# Patient Record
Sex: Male | Born: 1955
Health system: Southern US, Community
[De-identification: ages and names within clinical notes are randomized; demographics above are authoritative.]

## PROBLEM LIST (undated history)

## (undated) DIAGNOSIS — G4730 Sleep apnea, unspecified: Secondary | ICD-10-CM

## (undated) DIAGNOSIS — R079 Chest pain, unspecified: Secondary | ICD-10-CM

## (undated) DIAGNOSIS — E785 Hyperlipidemia, unspecified: Secondary | ICD-10-CM

## (undated) DIAGNOSIS — R0600 Dyspnea, unspecified: Secondary | ICD-10-CM

## (undated) DIAGNOSIS — I4510 Unspecified right bundle-branch block: Secondary | ICD-10-CM

## (undated) DIAGNOSIS — I2510 Atherosclerotic heart disease of native coronary artery without angina pectoris: Secondary | ICD-10-CM

## (undated) DIAGNOSIS — I10 Essential (primary) hypertension: Secondary | ICD-10-CM

## (undated) DIAGNOSIS — E119 Type 2 diabetes mellitus without complications: Secondary | ICD-10-CM

## (undated) HISTORY — DX: Unspecified right bundle-branch block: I45.10

## (undated) HISTORY — DX: Sleep apnea, unspecified: G47.30

## (undated) HISTORY — DX: Atherosclerotic heart disease of native coronary artery without angina pectoris: I25.10

## (undated) HISTORY — DX: Dyspnea, unspecified: R06.00

## (undated) HISTORY — DX: Essential (primary) hypertension: I10

## (undated) HISTORY — DX: Chest pain, unspecified: R07.9

## (undated) HISTORY — DX: Hyperlipidemia, unspecified: E78.5

## (undated) HISTORY — DX: Type 2 diabetes mellitus without complications: E11.9

---

## 2004-08-23 ENCOUNTER — Ambulatory Visit: Payer: Self-pay | Admitting: Family Medicine

## 2004-10-10 ENCOUNTER — Ambulatory Visit: Payer: Self-pay | Admitting: Family Medicine

## 2004-10-18 ENCOUNTER — Ambulatory Visit: Payer: Self-pay | Admitting: Family Medicine

## 2004-11-08 HISTORY — PX: OTHER SURGICAL HISTORY: SHX169

## 2004-11-16 ENCOUNTER — Ambulatory Visit (HOSPITAL_COMMUNITY): Admission: RE | Admit: 2004-11-16 | Discharge: 2004-11-17 | Payer: Self-pay | Admitting: Cardiovascular Disease

## 2004-11-28 ENCOUNTER — Ambulatory Visit: Payer: Self-pay | Admitting: Family Medicine

## 2005-01-17 ENCOUNTER — Ambulatory Visit: Payer: Self-pay | Admitting: Family Medicine

## 2005-03-01 ENCOUNTER — Ambulatory Visit: Payer: Self-pay | Admitting: Family Medicine

## 2005-06-06 ENCOUNTER — Ambulatory Visit: Payer: Self-pay | Admitting: Family Medicine

## 2005-09-13 ENCOUNTER — Ambulatory Visit: Payer: Self-pay | Admitting: Family Medicine

## 2005-12-19 ENCOUNTER — Ambulatory Visit: Payer: Self-pay | Admitting: Family Medicine

## 2006-03-28 ENCOUNTER — Ambulatory Visit: Payer: Self-pay | Admitting: Family Medicine

## 2006-06-06 ENCOUNTER — Ambulatory Visit: Payer: Self-pay | Admitting: Family Medicine

## 2006-09-06 ENCOUNTER — Ambulatory Visit: Payer: Self-pay | Admitting: Family Medicine

## 2006-10-25 ENCOUNTER — Ambulatory Visit: Payer: Self-pay | Admitting: Family Medicine

## 2006-12-12 ENCOUNTER — Ambulatory Visit: Payer: Self-pay | Admitting: Family Medicine

## 2007-03-01 HISTORY — PX: OTHER SURGICAL HISTORY: SHX169

## 2007-05-03 HISTORY — PX: OTHER SURGICAL HISTORY: SHX169

## 2007-10-20 HISTORY — PX: ESOPHAGOGASTRODUODENOSCOPY: SHX1529

## 2007-11-05 ENCOUNTER — Emergency Department (HOSPITAL_COMMUNITY): Admission: EM | Admit: 2007-11-05 | Discharge: 2007-11-05 | Payer: Self-pay | Admitting: Emergency Medicine

## 2007-11-13 ENCOUNTER — Ambulatory Visit: Payer: Self-pay | Admitting: Urgent Care

## 2007-11-20 HISTORY — PX: COLONOSCOPY: SHX174

## 2007-11-20 HISTORY — PX: ESOPHAGOGASTRODUODENOSCOPY: SHX1529

## 2007-12-04 ENCOUNTER — Ambulatory Visit (HOSPITAL_COMMUNITY): Admission: RE | Admit: 2007-12-04 | Discharge: 2007-12-04 | Payer: Self-pay | Admitting: Internal Medicine

## 2007-12-04 ENCOUNTER — Ambulatory Visit: Payer: Self-pay | Admitting: Internal Medicine

## 2008-03-11 ENCOUNTER — Ambulatory Visit: Payer: Self-pay | Admitting: Internal Medicine

## 2009-04-30 HISTORY — PX: CARDIOVASCULAR STRESS TEST: SHX262

## 2009-05-12 DIAGNOSIS — Z8719 Personal history of other diseases of the digestive system: Secondary | ICD-10-CM

## 2009-05-12 DIAGNOSIS — K222 Esophageal obstruction: Secondary | ICD-10-CM

## 2009-05-13 ENCOUNTER — Ambulatory Visit: Payer: Self-pay | Admitting: Internal Medicine

## 2009-05-14 DIAGNOSIS — K219 Gastro-esophageal reflux disease without esophagitis: Secondary | ICD-10-CM | POA: Insufficient documentation

## 2009-06-18 ENCOUNTER — Emergency Department (HOSPITAL_COMMUNITY): Admission: EM | Admit: 2009-06-18 | Discharge: 2009-06-18 | Payer: Self-pay | Admitting: Emergency Medicine

## 2009-09-01 ENCOUNTER — Ambulatory Visit (HOSPITAL_COMMUNITY): Admission: RE | Admit: 2009-09-01 | Discharge: 2009-09-01 | Payer: Self-pay | Admitting: Cardiovascular Disease

## 2009-09-10 ENCOUNTER — Observation Stay (HOSPITAL_COMMUNITY): Admission: AD | Admit: 2009-09-10 | Discharge: 2009-09-11 | Payer: Self-pay | Admitting: Cardiovascular Disease

## 2009-09-10 HISTORY — PX: CARDIAC CATHETERIZATION: SHX172

## 2009-11-01 HISTORY — PX: CARDIOVASCULAR STRESS TEST: SHX262

## 2010-11-06 LAB — CBC
HCT: 35.7 % — ABNORMAL LOW (ref 39.0–52.0)
Hemoglobin: 12.1 g/dL — ABNORMAL LOW (ref 13.0–17.0)
MCHC: 33.8 g/dL (ref 30.0–36.0)
MCV: 93.8 fL (ref 78.0–100.0)
Platelets: 215 10*3/uL (ref 150–400)
RDW: 14.6 % (ref 11.5–15.5)

## 2010-11-06 LAB — CARDIAC PANEL(CRET KIN+CKTOT+MB+TROPI)
CK, MB: 0.8 ng/mL (ref 0.3–4.0)
Relative Index: INVALID (ref 0.0–2.5)
Total CK: 84 U/L (ref 7–232)
Troponin I: 0.03 ng/mL (ref 0.00–0.06)

## 2010-11-06 LAB — GLUCOSE, CAPILLARY
Glucose-Capillary: 119 mg/dL — ABNORMAL HIGH (ref 70–99)
Glucose-Capillary: 233 mg/dL — ABNORMAL HIGH (ref 70–99)

## 2010-11-06 LAB — BASIC METABOLIC PANEL
CO2: 27 mEq/L (ref 19–32)
Calcium: 8.3 mg/dL — ABNORMAL LOW (ref 8.4–10.5)
Creatinine, Ser: 1.04 mg/dL (ref 0.4–1.5)
GFR calc Af Amer: >60 mL/min (ref >60)
GFR calc non Af Amer: >60 mL/min (ref >60)
Sodium: 137 mEq/L (ref 135–145)

## 2011-01-03 NOTE — Op Note (Signed)
NAME:  [name] [name], [name] [name] NO.:  [id number]   MEDICAL RECORD NO.:  [id number]          PATIENT TYPE:  EMS   LOCATION:  MAJO                         FACILITY:  [facility]   PHYSICIAN:  James L. Malon Kindle., M.D.DATE OF BIRTH:  1955/10/14   DATE OF PROCEDURE:  11/05/2007  DATE OF DISCHARGE:                               OPERATIVE REPORT   PROCEDURE:  Esophagogastroduodenoscopy with removal of food impaction.   MEDICATIONS:  Fentanyl 60 mcg, Versed 76 mg IV and Cetacaine spray.   DESCRIPTION OF PROCEDURE:  Nice gentleman who is referred from the  [facility] ER.  The procedure was explained including potential risk and  benefits and complications and the Pentax scope inserted blindly into  the esophagus.  A lot of saliva in the esophagus.  This was suctioned  out.  The patient did have some retching and gagging.  We reached the  distal esophagus there was several pieces of meat actually that were  impacted there.  I felt that I could see a lumen in one section and  actually went over there and pushed a piece of meat into the stomach.  Pulled back and there were several other pieces in the esophagus which  were also pushed with the scope into the stomach.  There was a ring that  was friable and somewhat ulcerated probably due to the meat being  impacted for so long.  The scope easily passed.  A complete endoscopy  was performed.  The duodenum including the bulb and second portion was  seen well, was normal.  The pyloric channel as well was normal.  The  antrum, body, fundus and cardia were seen well on the retroflex view and  were normal.  There was a 2-3 cm hiatal hernia in the ring as previously  mentioned.  Scope was withdrawn.  The patient tolerated the procedure  well, was awake and alert at termination of procedure.  There were no  immediate complications.   ASSESSMENT:  1. Food impaction removed.  2. Friable esophageal ring.   PLAN:  Will continue the patient on  Prilosec give twice daily for the  next three days.  Will have him stay on clear liquids for a couple hours  and graduate to soft foods and ask him to follow up with Dr. Jena Gauss in  [place].           ______________________________  Llana Aliment. Malon Kindle., M.D.     Waldron Session  D:  11/05/2007  T:  11/05/2007  Job:  [id number]   cc:   R. Roetta Sessions, M.D.  Delaney Meigs, M.D.

## 2011-01-03 NOTE — Op Note (Signed)
NAME:  [name] [name], [name] [name] NO.:  [id number]   MEDICAL RECORD NO.:  [id number]          PATIENT TYPE:  AMB   LOCATION:  DAY                           FACILITY:  [facility]   PHYSICIAN:  R. Roetta Sessions, M.D. DATE OF BIRTH:  Aug 25, 1955   DATE OF PROCEDURE:  12/04/2007  DATE OF DISCHARGE:                                PROCEDURE NOTE   PROCEDURES PERFORMED:  Esophagogastroduodenoscopy followed by screening  colonoscopy.   INDICATIONS FOR PROCEDURE:  A 56 year old gentleman with a history of  Schatzki ring, last dilated in 2002, who presented with food impaction  recently for which Dr. __________  emergently disimpacted him and he was  started on omeprazole 20 mg orally b.i.d. through our office on November 13, 2007.  This had been associated with improvement in his reflux  symptoms and some improvement in his dysphagia.  He tells me his  dilation back in 2002 lasted about 6 months and  then he developed  intermittent esophageal dysphagia, which seems to be worsened, and he  was dilated with a 56-French Maloney dilator at that time.  EGD is now  being done with an intent for esophageal dilation as appropriate.  He  has not had a screening colonoscopy.  He is not having any lower GI  tract symptoms.  There is no family history of colon cancer.  Consequently, EGD with colonoscopy is now being done.  This approach was  discussed with the patient at length.  Potential risks, benefits, and  alternatives have been reviewed, all questions were answered.  The  patient is agreeable.  Please see Dr. Luvenia Starch medical record.   PROCEDURE NOTE:  O2 saturation, blood pressure, pulse, and respirations  were monitored throughout the entirety of both procedures.  Conscious  sedation with Versed 6 mg IV and Demerol 100 mg IV in divided doses.  Instrument:  Pentax video chip system.  Cetacaine spray for topical  oropharyngeal anesthesia.   FINDINGS:  EGD examination of tubular esophagus revealed  a Schatzki  ring.  Esophageal mucosa otherwise  appeared normal.  There is no  esophagitis or evidence of Barrett's esophagus or neoplasia.  EG  junction was easily traversed with a scope and the stomach, gastric  cavity was empty and insufflated well with air.  Examination of gastric  mucosa including retroflex view of the proximal stomach and  esophagogastric junction, demonstrated only a small hiatal hernia.  Pylorus is patent, easily traversed.  Examination of the bulb and second  portion revealed no abnormalities with therapeutic/diagnostic maneuvers.  The scope was withdrawn, and a 58-French Maloney dilator was passed in  full insertion with ease, look back revealed the ring had been ruptured  nicely without apparent complications.  The patient tolerated the  procedure well and was prepared for colonoscopy.   Digital rectal exam revealed no abnormalities.  The scope was placed.  Prep was adequate.  Colon:  Colonic mucosa was surveyed from  rectosigmoid junction through the left transverse to right colon to the  appendiceal orifice, ileocecal valve, and cecum.  These structures were  well seen  and photographed for the record.  From this level, the scope  was slowly and cautiously withdrawn.  All previously mentioned mucosal  surfaces were again seen.  The colonic mucosa appeared normal.  Scope  was pulled down the rectum where thorough examination of the rectal  mucosa including retroflexed view of the anal verge demonstrated no  abnormalities.  The patient tolerated the procedure well with  reaction  to endoscopy.   IMPRESSION:  Esophagogastroduodenoscopy:  Schatzki ring otherwise  unremarkable, soft, status post dilation as described above.  Small  hiatal hernia, otherwise normal stomach.  D1 and D2.  Colonoscopy findings:  Normal colon and normal rectum.   RECOMMENDATIONS:  1. Repeat screening colonoscopy in 10 years.  2. Continue omeprazole 20 mg orally b.i.d. for the next 3  months.  We      will plan on see the patient back in 3 months to see how he is      doing and go from there.      Jonathon Bellows, M.D.  Electronically Signed     RMR/MEDQ  D:  12/04/2007  T:  12/05/2007  Job:  [id number]   cc:   Delaney Meigs, M.D.  Fax: [phone]

## 2011-01-03 NOTE — Consult Note (Signed)
NAME:  [name] [name], [name] [name] NO.:  [id number]   MEDICAL RECORD NO.:  [id number]          PATIENT TYPE:  EMS   LOCATION:  MAJO                         FACILITY:  [facility]   PHYSICIAN:  James L. Malon Kindle., M.D.DATE OF BIRTH:  1956/08/13   DATE OF CONSULTATION:  11/05/2007  DATE OF DISCHARGE:                                 CONSULTATION   GASTROENTEROLOGY CONSULTATION   REFERRING PHYSICIAN:  [facility] emergency room.   HISTORY OF PRESENT ILLNESS:  A 55 year old gentleman, patient of Dr.  Roetta Sessions and Dr. Joette Catching, who has had previous reflux, has  had esophageal strictures, has been dilated in the past.  He has food  hang up occasionally.  Last night he ate roast beef and has been unable  to swallow saliva since.  He went to the [facility] emergency room and  was spitting up saliva.  They did not have a gastroenterologist on call  and he was sent here for definitive care.  He is not having a great deal  of pain but still spitting up saliva. He has had two doses of glucagon  during the night and has failed to improve.   CURRENT MEDICATIONS:  1. Prilosec.  2. Actos.  3. Metformin.  4. Diovan.  5. Plavix.  6. Occasional aspirin.   ALLERGIES:  VICODIN.   PAST MEDICAL HISTORY:  He does have a history of coronary artery  disease. He has had previous bypass surgery and stent. He sees Dr.  Nanetta Batty.  He says his heart is doing quite well.  He has never  had any other surgeries.   SOCIAL HISTORY:  He is married and does not smoke or drink.   FAMILY HISTORY:  Noncontributory.   PHYSICAL EXAMINATION:  VITAL SIGNS:  Non-remarkable.  GENERAL:  Alert, oriented white male. He is not icteric.  No signs of  chronic liver disease.  HEENT:  Throat is normal. No food materials or blood in the pharynx.  NECK:  Is somewhat short and no masses, crepitus, etc.  LUNGS:  Clear.  HEART:  Reveals regular rate and rhythm without murmurs, rubs or  gallops.  ABDOMEN:  Soft and nontender.   ASSESSMENT:  1. Meat impaction of the esophagus.  Patient needs endoscopy with      removal of this.  I have spent some time talking with him about      this.  We discussed the possibility of being unable to remove the      meat bolus, perforation, bleeding, etc. from the procedure.  He      understands all this.  He knows if we cannot get this out he may      need to go to surgery and have the thoracic surgeons use a rigid      scope to try to get it out.  2. Non-insulin-dependent diabetes.  3. Coronary disease with history of stents and previous bypass      surgery.   PLAN:  We will proceed at this time with endoscopy as noted above.  Risks and benefits were all  discussed with patient.           ______________________________  Llana Aliment. Malon Kindle., M.D.     Waldron Session  D:  11/05/2007  T:  11/05/2007  Job:  [id number]   cc:   Delaney Meigs, M.D.  Jonathon Bellows, M.D.

## 2011-01-03 NOTE — Consult Note (Signed)
NAME:  [name] [name], [name] [name] NO.:  [id number]   MEDICAL RECORD NO.:  [id number]          PATIENT TYPE:  AMB   LOCATION:  DAY                           FACILITY:  [facility]   PHYSICIAN:  R. Roetta Sessions, M.D. DATE OF BIRTH:  [birth year]   DATE OF CONSULTATION:  DATE OF DISCHARGE:                                 CONSULTATION   REQUESTING PHYSICIAN:  Dr. Randa Evens.   CHIEF COMPLAINT:  Esophageal stricture, history of food impaction.   HISTORY OF PRESENT ILLNESS:  [name] [name] is a 55 year old Caucasian male  who was seen at [facility] ER on November 05, 2007.  He had eaten roast  beef and was unable to swallow saliva since.  He was sent to [facility]  emergency room for food impaction after failing previous glucagon  attempts.  He has a history of previous Schatzki's ring and was dilated  back in 2002 with a 56 [place] Maloney dilator.  He also has a hiatal  hernia.  He underwent urgent EGD for food impaction by Dr. Randa Evens on  November 05, 2007.  He was found to have a friable somewhat ulcerated  esophageal ring and a normal duodenal including the bulb and second  portion.  Pyloric channel was normal.  Antrum, body, fundus and cardia  were normal.  There was a 2-3 cm hiatal hernia in the ring per his  report.  He was started on Prilosec but admits to not taking this.  Instead he is taking famotidine 10 mg b.i.d.  He notes history of  chronic intermittent dysphagia.  He feels as though food is occasionally  hung in his, and points to his mid esophagus.  It usually hangs there  for a couple of minutes up to 15.  He occasionally has regurgitation.  He seldom has problems with water.  He has seldom heartburn or  indigestion.   PAST MEDICAL AND SURGICAL HISTORY:  EGD for food impaction by Dr.  Randa Evens November 05, 2007, as described in the HPI.  EGD by Dr. Jena Gauss March 06, 2001, critical Schatzki's ring dilated with 56 [place] Maloney  dilator, small hiatal hernia.  He has a history of diabetes  mellitus,  hypertension, rosacea, coronary artery disease status post CABG and  stent placement.  He is on Plavix.   CURRENT MEDICATIONS:  1. Actos/met 15/850 mg t.i.d.  2. Plavix 75 mg daily.  3. Tetracycline 25 mg daily.  4. Metoprolol 25 mg daily.  5. Diovan 320 mg daily.  6. Niaspan 500 mg t.i.d.  7. Lexapro 20 mg daily.  8. Crestor 10 mg daily.  9. Glipizide ER 2.5 mg daily.  10.Januvia 100 mg daily.  11.Famotidine 10 mg b.i.d.  12.Aspirin 81 mg daily.  13.Coenzyme Q10 100 mg b.i.d.  14.Fish oil 1 gram t.i.d.  15.Multivitamin once daily.  16.Meloxicam 15 mg p.r.n.   ALLERGIES:  VICODIN, HYDROCHLOROTHIAZIDE.   FAMILY HISTORY:  There is no known family history of colon carcinoma or  chronic GI problems.  Mother has history of diabetes.  Father deceased  at age 34 secondary to suicide.  He has lost one brother due to  complications from surgery and has one healthy living brother and  sister.   SOCIAL HISTORY:  [name] [name] is married.  He is employed with [employer].  He denies any tobacco, alcohol or drug use.   REVIEW OF SYSTEMS:  See HPI.  GI:  He denies any constipation, diarrhea,  rectal bleeding, melena, anorexia, early satiety or weight loss.  Otherwise negative review of systems.   PHYSICAL EXAMINATION:  VITAL SIGNS:  Weight 238 pounds, height 71  inches, temperature 97.9, blood pressure 138/82 and pulse 60.  GENERAL:  [name] [name] is an obese Caucasian male who is alert, oriented,  pleasant and cooperative and in no acute distress.  HEENT.  Sclerae clear, nonicteric.  Conjunctivae clear.  Oropharynx pink  and moist without any lesions.  NECK:  Supple without any mass or thyromegaly.  CHEST:  Heart regular rate and rhythm.  Normal S1 and S2 without  murmurs, clicks, rubs or gallops.  LUNGS:  Clear to auscultation bilaterally.  ABDOMEN:  Protuberant with positive bowel sounds x4.  No bruits  auscultated.  Soft, nontender, nondistended without palpable mass  or  hepatosplenomegaly.  No rebound tenderness or guarding.  EXTREMITIES:  Without clubbing or edema.  SKIN:  Pink, warm and dry without any rash or jaundice.   IMPRESSION:  [name] [name] is a 55 year old male with recent food impaction  and EGD performed by Dr. Randa Evens in [place].  He was found to have a  hiatal hernia as well as a friable ulcerated ring in his esophagus.  He  has not been on proton pump inhibitor.  He still continues to have  symptoms of intermittent dysphagia.  He is also on tetracycline which  could be causing ulceration in the esophagus as well or it may have been  related to food bolus.  Nonetheless, I do feel that he may need  esophageal dilatation to prevent recurrence of food impaction.  He has  also never had screening colonoscopy and this was discussed with him  today.   PLAN:  1. He is to stop famotidine and begin omeprazole 20 mg b.i.d. #60 with      two refills.  2. EGD with possible esophageal dilatation by Dr. Jena Gauss in the near      future; I have discussed screening colonoscopy at the same time; I      have discussed risks and benefits which include but are not limited      to bleeding, infection, perforation and drug reaction.  He agrees      with the plan and consent will be obtained.  3. He is to hold his Plavix and aspirin for 5 days prior to the      procedure.  4. He is to take half of his Actos, glipizide and Januvia the day      prior to and of the procedures.  5. Further recommendations pending procedures.      Lorenza Burton, N.P.      Jonathon Bellows, M.D.  Electronically Signed    KJ/MEDQ  D:  11/13/2007  T:  11/13/2007  Job:  [id number]   cc:   Delaney Meigs, M.D.  Fax: [phone]

## 2011-01-03 NOTE — Assessment & Plan Note (Signed)
NAMEMarland Kitchen  [name] [name], [name] [name]                  CHART#:  [id number]   DATE:  03/11/2008                       DOB:  [birth year]   Followup Schatzki ring, gastroesophageal reflux disease, and hiatal  hernia.  I last saw the patient at the time of EGD, colonoscopy on  12/04/2007.  He was found to have a Schatzki ring, a small hiatal  hernia, it was dilated up to a 58-French size  with [facility] dilators.  Colonoscopy for screening purposes revealed normal rectum and colon.  He  has done well on Prilosec 20 mg orally daily, no recurrent dysphagia.  However, it is giving him diarrhea.   CURRENT MEDICATION LIST:  Reviewed.  Please see attached list.   ALLERGIES:  Vicodin and hydrochlorothiazide.   PHYSICAL EXAMINATION:  GENERAL:  He appears at his baseline.  VITAL SIGNS:  Weight 246, height 5 feet 11 inches, temp 98.2, BP 146/68,  and pulse 64.  SKIN:  Warm and dry.  There is no jaundice.  CHEST:  Lungs are clear to auscultation.  CARDIAC:  Regular rate and rhythm without murmur, gallop, or rub.  ABDOMEN:  Obese.  Positive bowel sounds.  Soft and nontender.  Without  appreciable mass or organomegaly.   ASSESSMENT:  Dysphagia secondary to Schatzki ring, resolved, status post  dilation.  Small hiatal hernia, negative colonoscopy, and diarrhea  secondary to Prilosec.   RECOMMENDATIONS:  Stop Prilosec and begin Protonix generic 40 mg orally  daily, prescription given.  Antireflux diet emphasized.  He may or may  not need dilation in the future.  Repeat screening colonoscopy in 10  years.  I will plan to see the patient back in 1 year.       Jonathon Bellows, M.D.  Electronically Signed     RMR/MEDQ  D:  03/11/2008  T:  03/12/2008  Job:  [id number]   cc:   Delaney Meigs, M.D.

## 2011-01-06 NOTE — Cardiovascular Report (Signed)
NAME:  [name] [name], [name] [name] NO.:  [id number]   MEDICAL RECORD NO.:  [id number]          PATIENT TYPE:  OIB   LOCATION:  6533                         FACILITY:  [facility]   PHYSICIAN:  Nanetta Batty, M.D.   DATE OF BIRTH:  1956-01-15   DATE OF PROCEDURE:  11/16/2004  DATE OF DISCHARGE:                              CARDIAC CATHETERIZATION   PROCEDURE PERFORMED:  Cardiac catheterization/PCI stenting.   BRIEF HISTORY:  [name] [name] is a 55 year old white male with history of  coronary artery bypass grafting x3 10 years ago.  He came to see me in the  office October 17, 2004 with throat and chest discomfort, similar to his  pre-bypass symptoms.  A Cardiolite stress test shows ischemia in the  circumflex territory, normal LV function.  His medications were adjusted.  He presents now for diagnostic coronary arteriography and potential  intervention.   PROCEDURE DESCRIPTION:  The patient was brought to the second floor [facility] cardiac catheterization lab in a postabsorptive state.  He was  premedicated with p.o. Valium and IV Versed.  His right groin was prepped  and shaved in the usual sterile fashion.  Xylocaine 1% was used for local  anesthesia.  A 6-French sheath was inserted into the right femoral artery,  using standard Seldinger technique.  Then 6-French right and left Judkins  diagnostic catheters, as well as a 6-French pigtail catheter, a 6-French AL1  diagnostic catheter and a 6-French AR1 diagnostic catheter were used for  selective coronary angiography, left ventriculography, selective vein graft  angiography, selective IMA angiography and distal abdominal aortography.  Visipaque dye was used through the entirety of the case.  Central aortic,  ventricular and pullback pressures were recorded.  It should be noted that  the left main was only engageable using the AL1 and the PDA vein graft using  the AR1.   HEMODYNAMICS:  1.  Aortic:  Systolic pressure 142,  diastolic pressure 64.  2.  Left ventricular:  Systolic pressure 141, diastolic pressure 7.   SELECTIVE CORONARY ANGIOGRAPHY:  1.  LEFT MAIN:  Normal.  2.  LEFT ANTERIOR DESCENDING:  Occluded proximally up to the left septal.      RAMUS INTERMEDIUS:  Occluded at its origin.  3.  LEFT CIRCUMFLEX:  A dominant vessel with 75% segmental proximal      stenosis.  It gave off a first marginal branch, which was moderate in      size and a distal marginal branch.  The continuation to the PDA had high-      grade segmental disease.  4.  RIGHT CORONARY ARTERY:  Nondominant and free of significant disease.  5.  LEFT INTERNAL MAMMARY ARTERY:  Widely patent to the LAD.  6.  GRAFTS:      1.  VEIN GRAFT TO THE PDA:  Widely patent with at most 40% stenosis at          the anastomosis.      2.  VEIN GRAFT TO THE RAMUS INTERMEDIUS:  95% fairly focal eccentric  distal stenosis, just before the anastomosis.  The ramus branch          itself was moderate in size.   LEFT VENTRICULOGRAPHY:  RAO left ventriculogram was performed using 25 cc of  Visipaque at 12 cc/sec.  The overall LVEF was estimated at greater than 60%,  without focal wall motion abnormalities.   DISTAL ABDOMINAL AORTOGRAPHY:  Performed using 20 cc of Visipaque dye at 20  cc/sec.  The renal artery was widely patent.  Infrarenal abdominal aorta and  the iliac bifurcation appear free of significant atherosclerotic changes.   IMPRESSION:  [name] [name] has high-grade distal ramus intermedius branch SVG  stenosis, which is probably his culprit lesion and responsible for his  coronary abnormality.  I cannot be sure that his proximal circumflex in this  dominant vessel is not also physiologically significant.  We will proceed  with PCI stenting of his ramus branch vein graft.   PROCEDURE DESCRIPTION:  The existing 6-French in the right femoral artery  was exchanged over a wire for a 7-French sheath.  A 6-French sheath was then  inserted  into the right femoral vein.  The patient received aspirin prior to  the procedure.  He received 600 mg of p.o. Plavix, 5000 units of heparin  intravenously with the ACT of 242.  He received Integrilin double bolus and  infusion. Retrograde aortic pressure was monitored through the entire case.  The patient then received 200 mcg of intra-graft nitroglycerin.   Using a 7-French JR4 guide catheter, along with an OM4 190 Asahi soft guide  wire and a 256 cutting balloon, atherectomy was performed to the distal  ramus intermedius branch vein graft.  This resulted in a favorable  angiographic result.  Following this, a 3.0 13 Cypher stent was then placed  precisely at the distal end of the vein graft under angiographic and  fluoroscopic control.  This was then inflated to 14 atm, resulting in  reduction of a 95% distal ramus intermedius branch stenosis to 0% residual -  - without dissection. There was TIMI-3 flow noted throughout the case.  The  patient had no hemodynamic electrocardiographic sequelae.   OVERALL IMPRESSION:  Successful ramus intermedius branch distal sequential  vein graft percutaneous coronary intervention and stenting, using cutting  balloon atherectomy and Cypher drug-eluding stent.  Excellent angiographic  results.  The patient does have residual disease at the origin/proximal  portion of his native dominant circumflex, which will be treated medically  at this time.  Guide wire and catheter were removed.  The sheaths were sewn  securely in place.  The patient left the lab in stable condition.  The  sheaths will be removed once his ACT falls below 150.  Integrilin will be  continued for 18 hr.  He will be given a p.o. prescription of aspirin and  Plavix, and discharged home in the morning if he remains clinically stable  overnight.      JB/MEDQ  D:  11/16/2004  T:  11/16/2004  Job:  [id number]   cc:   Delaney Meigs, M.D.  [address] [place]  [place] [address]  Fax:  [phone]

## 2011-05-15 LAB — BASIC METABOLIC PANEL
BUN: 8
Calcium: 9.5
Creatinine, Ser: 0.98
GFR calc Af Amer: >60
Glucose, Bld: 181 — ABNORMAL HIGH
Potassium: 3.6

## 2011-05-15 LAB — DIFFERENTIAL
Basophils Absolute: 0
Basophils Absolute: 0
Basophils Relative: 0
Basophils Relative: 0
Eosinophils Absolute: 0
Eosinophils Absolute: 0.1
Eosinophils Relative: 0
Eosinophils Relative: 1
Lymphocytes Relative: 13
Lymphocytes Relative: 23
Lymphs Abs: 1.8
Lymphs Abs: 2.3
Monocytes Absolute: 0.8
Monocytes Absolute: 0.7
Monocytes Relative: 6
Monocytes Relative: 7
Neutro Abs: 10.9 — ABNORMAL HIGH
Neutro Abs: 6.8
Neutrophils Relative %: 81 — ABNORMAL HIGH
Neutrophils Relative %: 69

## 2011-05-15 LAB — CBC
HCT: 38.2 — ABNORMAL LOW
HCT: 37.6 — ABNORMAL LOW
Hemoglobin: 13.2
Hemoglobin: 13.2
MCHC: 34.5
MCV: 92.8
Platelets: 240
Platelets: 246
RBC: 4.12 — ABNORMAL LOW
RBC: 4.09 — ABNORMAL LOW
RDW: 14.1
WBC: 13.5 — ABNORMAL HIGH
WBC: 9.9

## 2011-05-15 LAB — I-STAT 8, (EC8 V) (CONVERTED LAB)
Bicarbonate: 24
HCT: 42
Operator id: 270651
Sodium: 138
pCO2, Ven: 38.1 — ABNORMAL LOW

## 2011-05-15 LAB — POCT I-STAT CREATININE: Creatinine, Ser: 1

## 2011-12-08 ENCOUNTER — Encounter (HOSPITAL_COMMUNITY): Payer: Self-pay | Admitting: Pharmacy Technician

## 2011-12-08 ENCOUNTER — Ambulatory Visit
Admission: RE | Admit: 2011-12-08 | Discharge: 2011-12-08 | Disposition: A | Discharge status: Home / Self Care | Payer: BC Managed Care – PPO | Source: Ambulatory Visit | Attending: Cardiovascular Disease | Admitting: Cardiovascular Disease

## 2011-12-08 ENCOUNTER — Other Ambulatory Visit: Payer: Self-pay | Admitting: Cardiovascular Disease

## 2011-12-08 DIAGNOSIS — Z87891 Personal history of nicotine dependence: Secondary | ICD-10-CM

## 2011-12-08 DIAGNOSIS — Z01811 Encounter for preprocedural respiratory examination: Secondary | ICD-10-CM

## 2011-12-08 HISTORY — PX: CARDIOVASCULAR STRESS TEST: SHX262

## 2011-12-11 ENCOUNTER — Other Ambulatory Visit: Payer: Self-pay | Admitting: Cardiovascular Disease

## 2011-12-13 ENCOUNTER — Ambulatory Visit (HOSPITAL_COMMUNITY)
Admission: RE | Admit: 2011-12-13 | Discharge: 2011-12-13 | Disposition: A | Discharge status: Home / Self Care | Payer: BC Managed Care – PPO | Source: Ambulatory Visit | Attending: Cardiovascular Disease | Admitting: Cardiovascular Disease

## 2011-12-13 ENCOUNTER — Encounter (HOSPITAL_COMMUNITY)
Admission: RE | Disposition: A | Discharge status: Home / Self Care | Payer: Self-pay | Source: Ambulatory Visit | Attending: Cardiovascular Disease

## 2011-12-13 DIAGNOSIS — E119 Type 2 diabetes mellitus without complications: Secondary | ICD-10-CM | POA: Insufficient documentation

## 2011-12-13 DIAGNOSIS — I2510 Atherosclerotic heart disease of native coronary artery without angina pectoris: Secondary | ICD-10-CM | POA: Insufficient documentation

## 2011-12-13 DIAGNOSIS — G4733 Obstructive sleep apnea (adult) (pediatric): Secondary | ICD-10-CM | POA: Insufficient documentation

## 2011-12-13 DIAGNOSIS — I10 Essential (primary) hypertension: Secondary | ICD-10-CM | POA: Insufficient documentation

## 2011-12-13 DIAGNOSIS — Z9861 Coronary angioplasty status: Secondary | ICD-10-CM | POA: Insufficient documentation

## 2011-12-13 DIAGNOSIS — I25810 Atherosclerosis of coronary artery bypass graft(s) without angina pectoris: Secondary | ICD-10-CM | POA: Insufficient documentation

## 2011-12-13 HISTORY — PX: LEFT HEART CATHETERIZATION WITH CORONARY ANGIOGRAM: SHX5451

## 2011-12-13 HISTORY — PX: CARDIAC CATHETERIZATION: SHX172

## 2011-12-13 LAB — GLUCOSE, CAPILLARY: Glucose-Capillary: 204 mg/dL — ABNORMAL HIGH (ref 70–99)

## 2011-12-13 SURGERY — LEFT HEART CATHETERIZATION WITH CORONARY ANGIOGRAM
Anesthesia: LOCAL

## 2011-12-13 MED ORDER — FENTANYL CITRATE 0.05 MG/ML IJ SOLN
INTRAMUSCULAR | Status: AC
  Filled 2011-12-13: qty 2

## 2011-12-13 MED ORDER — INSULIN ASPART 100 UNIT/ML ~~LOC~~ SOLN
0.0000 [IU] | SUBCUTANEOUS | Status: DC
Start: 2011-12-14 — End: 2011-12-13

## 2011-12-13 MED ORDER — MIDAZOLAM HCL 2 MG/2ML IJ SOLN
INTRAMUSCULAR | Status: AC
  Filled 2011-12-13: qty 2

## 2011-12-13 MED ORDER — SODIUM CHLORIDE 0.9 % IJ SOLN: 3.0000 mL | INTRAMUSCULAR | Status: DC | PRN

## 2011-12-13 MED ORDER — INSULIN ASPART 100 UNIT/ML ~~LOC~~ SOLN
SUBCUTANEOUS | Status: AC
  Administered 2011-12-13: 3 [IU] via SUBCUTANEOUS
  Filled 2011-12-13: qty 1

## 2011-12-13 MED ORDER — NITROGLYCERIN 0.2 MG/ML ON CALL CATH LAB
INTRAVENOUS | Status: AC
  Filled 2011-12-13: qty 1

## 2011-12-13 MED ORDER — LIDOCAINE HCL (PF) 1 % IJ SOLN
INTRAMUSCULAR | Status: AC
  Filled 2011-12-13: qty 30

## 2011-12-13 MED ORDER — HEPARIN (PORCINE) IN NACL 2-0.9 UNIT/ML-% IJ SOLN
INTRAMUSCULAR | Status: AC
  Filled 2011-12-13: qty 2000

## 2011-12-13 MED ORDER — DIAZEPAM 5 MG PO TABS: 5.0000 mg | ORAL_TABLET | ORAL | Status: DC

## 2011-12-13 MED ORDER — SODIUM CHLORIDE 0.9 % IV SOLN
INTRAVENOUS | Status: DC
  Administered 2011-12-13: 11:00:00 via INTRAVENOUS

## 2011-12-13 NOTE — H&P (Signed)
  H & P will be scanned in.  Pt was reexamined and existing H & P reviewed. No changes found.  Runell Gess, MD [facility] 12/13/2011 2:47 PM

## 2011-12-13 NOTE — H&P (Signed)
  H & P will be scanned in.  Pt was reexamined and existing H & P reviewed. No changes found.  Runell Gess, MD [facility] 12/13/2011 2:13 PM

## 2011-12-13 NOTE — Op Note (Signed)
[name] [name] is a 56 y.o. male    [id number] LOCATION:  FACILITY: [facility]  PHYSICIAN: Nanetta Batty, M.D. [birth year]   DATE OF PROCEDURE:  12/13/2011  DATE OF DISCHARGE:  [facility]  CARDIAC CATHETERIZATION     History obtained from chart review. [name] [name] is a 56 year old married Caucasian male father of 3 children with a history of CAD status post bypass grafting in 1996. A stent stented his SVG to his ramus branch back in 2006. His other problems include hypertension, hyperlipidemia, nonsmoker and diabetes and obstructive sleep apnea on CPAP. He Myoview stress test performed 04/30/2006 showed lateral ischemia at that time he is complaining of dyspnea on exertion that catheterized in 09/10/2009 revealing a 75% in-stent restenosis and quit within the ramus branch SVG stent restented him with a drug-eluting stent. My beeper for 11/01/2009 showed resolution of ischemic abnormality. His rate asymptomatic since. A followup Myoview performed recently showed a new lateral wall defect that he has been asymptomatic. He presents now for angiography and potential intervention.   PROCEDURE DESCRIPTION:    The patient was brought to the second floor  [facility] in the postabsorptive state. He was  premedicated with Valium 5 mm by mouth, IV Versed and fentanyl. His right groin was prepped and shaved in usual sterile fashion. Xylocaine 1% was used  for local anesthesia. A 5 French sheath was inserted into the right common femoral  artery using standard Seldinger technique.5 French right and left Judkins diagnostic catheters all the 5 French pigtail catheter were used for selective coronary angiography, selective vein graft and internal mammary artery angiography and left ventriculography. Visipaque was used for the entirety of the case. Retrograde aorta, left ventricular pressures were recorded.  HEMODYNAMICS:    AO SYSTOLIC/AO DIASTOLIC: 148/66   LV  SYSTOLIC/LV DIASTOLIC: 143/11  ANGIOGRAPHIC RESULTS:   1. Left main; normal  2. LAD; occluded proximally 3. Left circumflex; dominant with a 90% proximal PDA stenosis in the left PDA system with competitive flow.  4. Right coronary artery; nondominant 5.LIMA TO LAD; widely patent 6. SVG TO PDA: Widely patent     SVG TO ramus branch: 95% "in-stent restenosis" within the distal portion of the previously stented segment in the distal ramus branch SVG.     7. Left ventriculography; RAO left ventriculogram was performed using  25 mL of Visipaque dye at 12 mL/second. The overall LVEF estimated  60 % Without wall motion abnormalities  IMPRESSION:Mr. Kissick shows in-stent restenosis within the distal ramus branch. The stented segments with 2 prior previously placed stent. I believe that there is not enough room to perform a third stent procedure and given the fact that his LV function is normal and is totally asymptomatic I'm going to treat him medically at this time. The sheath was removed and pressure was held on the groin to achieve hemostasis. The patient left unstable condition. He'll be gently hydrated to remain recumbent for 4 hours at which time we discharged home and will follow up will followup with me in one to 2 weeks in the office    Runell Gess. MD, [facility] 12/13/2011 2:50 PM

## 2011-12-13 NOTE — Discharge Instructions (Signed)

## 2012-07-13 HISTORY — PX: OTHER SURGICAL HISTORY: SHX169

## 2013-01-07 ENCOUNTER — Encounter: Payer: Self-pay | Admitting: Cardiology

## 2013-01-10 ENCOUNTER — Ambulatory Visit: Payer: BC Managed Care – PPO | Admitting: Cardiology

## 2013-01-21 ENCOUNTER — Encounter: Payer: Self-pay | Admitting: Cardiovascular Disease

## 2013-01-22 ENCOUNTER — Encounter: Payer: Self-pay | Admitting: Cardiology

## 2013-01-22 ENCOUNTER — Ambulatory Visit (INDEPENDENT_AMBULATORY_CARE_PROVIDER_SITE_OTHER): Payer: BC Managed Care – PPO | Admitting: Cardiology

## 2013-01-22 VITALS — BP 136/68 | HR 58 | Ht 70.0 in | Wt 236.0 lb

## 2013-01-22 DIAGNOSIS — E119 Type 2 diabetes mellitus without complications: Secondary | ICD-10-CM | POA: Insufficient documentation

## 2013-01-22 DIAGNOSIS — E785 Hyperlipidemia, unspecified: Secondary | ICD-10-CM

## 2013-01-22 DIAGNOSIS — I10 Essential (primary) hypertension: Secondary | ICD-10-CM

## 2013-01-22 DIAGNOSIS — G4730 Sleep apnea, unspecified: Secondary | ICD-10-CM

## 2013-01-22 DIAGNOSIS — E669 Obesity, unspecified: Secondary | ICD-10-CM

## 2013-01-22 DIAGNOSIS — I4510 Unspecified right bundle-branch block: Secondary | ICD-10-CM

## 2013-01-22 DIAGNOSIS — I2510 Atherosclerotic heart disease of native coronary artery without angina pectoris: Secondary | ICD-10-CM

## 2013-01-22 NOTE — Progress Notes (Signed)
01/22/2013 [name] [name]   Sep 21, 1955  [id number]  Primary Physicia No primary provider on file. Primary Cardiologist: Dr Allyson Sabal  HPI:  . The patient is a 57 year old, moderately overweight, married Caucasian male, father of 3, grandfather to 6 grandchildren followed by Dr Allyson Sabal. He has a history of CAD status post bypass grafting in 1996. Dr Allyson Sabal stented the SVG to his ramus branch in 2006. His other problems include hypertension, hyperlipidemia and noninsulin-requiring diabetes as well as obstructive sleep apnea on CPAP.            He had a Myoview stress test performed April 30, 2009, which showed new lateral ischemia. He was complaining of dyspnea on exertion at that time. He was  catheterized September 10, 2009, revealing 75% in-stent restenosis within the stented segment which was restented with a drug-eluting stent. Myoview performed November 01, 2009, showed resolution of his ischemic abnormality. He was asymptomatic after that. A followup Myoview did show new anterolateral ischemia performed December 08, 2011, which led to a cath on December 13, 2011, that showed 95% in-stent restenosis within the previously restented segment. He was asymptomatic then and it was decided to treat him medically.               He is in the office today for routine 6 month follow up. He denies any chest pain. He does note occasional exertional dyspnea relieved with rest.              Current Outpatient Prescriptions  Medication Sig Dispense Refill  . aspirin EC 81 MG tablet Take 81 mg by mouth 2 (two) times daily.       . clopidogrel (PLAVIX) 75 MG tablet Take 75 mg by mouth daily.      Marland Kitchen escitalopram (LEXAPRO) 20 MG tablet Take 20 mg by mouth daily.      . famotidine (PEPCID) 20 MG tablet Take 20 mg by mouth 2 (two) times daily.      . fish oil-omega-3 fatty acids 1000 MG capsule Take 1 g by mouth 2 (two) times daily.      . insulin glargine (LANTUS) 100 UNIT/ML injection Inject into the skin at bedtime. 50units       . JANUMET 50-1000 MG per tablet Take 1 tablet by mouth 2 (two) times daily with a meal.       . losartan (COZAAR) 100 MG tablet Take 100 mg by mouth daily.      . metoprolol succinate (TOPROL-XL) 25 MG 24 hr tablet Take 25 mg by mouth daily.      . Multiple Vitamin (MULITIVITAMIN WITH MINERALS) TABS Take 1 tablet by mouth daily.      . rosuvastatin (CRESTOR) 5 MG tablet Take 5 mg by mouth daily.       No current facility-administered medications for this visit.    Allergies  Allergen Reactions  . Hydrochlorothiazide Itching  . Hydrocodone-Acetaminophen Swelling    Swelling of hands and feet followed by skin peeling off of hands and feet    History   Social History  . Marital Status: Married    Spouse Name: N/A    Number of Children: N/A  . Years of Education: N/A   Occupational History  . Not on file.   Social History Main Topics  . Smoking status: Former Smoker    Quit date: 02/19/1995  . Smokeless tobacco: Not on file  . Alcohol Use: Not on file  . Drug Use: Not on file  .  Sexually Active: Not on file   Other Topics Concern  . Not on file   Social History Narrative  . No narrative on file     Review of Systems: General: negative for chills, fever, night sweats or weight changes.  Cardiovascular: negative for chest pain, dyspnea on exertion, edema, orthopnea, palpitations, paroxysmal nocturnal dyspnea or shortness of breath Dermatological: negative for rash Respiratory: negative for cough or wheezing Urologic: negative for hematuria Abdominal: negative for nausea, vomiting, diarrhea, bright red blood per rectum, melena, or hematemesis Neurologic: negative for visual changes, syncope, or dizziness Past history of depression, treated. He has had dyslipidemia with low HDL that did not respond to Niacin in the past. He says he is compliant with C-pap "most of the time" All other systems reviewed and are otherwise negative except as noted above.    Blood  pressure 136/68, pulse 58, height 5\' 10"  (1.778 m), weight 236 lb (107.049 kg).  General appearance: alert, cooperative and no distress Lungs: clear to auscultation bilaterally Heart: regular rate and rhythm  EKG  EKG: normal EKG, normal sinus rhythm, unchanged from previous tracings, RBBB.  ASSESSMENT AND PLAN:   CAD, CABG '96. SVG-RI PCI '06, 1/11. Cath 4/13 with ISR- medical Rx only At his last cath he did have ISR of the SVG-RI but no symptoms, plan is for medical Rx.  HTN (hypertension) controlled  Dyslipidemia His LDL is treated, HDL is low, no change in the past with Niaspan  Diabetes mellitus, Type 2 NIDDM On oral agents  Sleep apnea He says he is compliant with C-pap  Obesity (BMI 30.0-34.9) BMI 33.8   PLAN  No change in medications. Follow up with Dr Allyson Sabal in 6 months.   [name] KPA-C 01/22/2013 5:06 PM

## 2013-01-22 NOTE — Assessment & Plan Note (Signed)
His LDL is treated, HDL is low, no change in the past with Niaspan

## 2013-01-22 NOTE — Assessment & Plan Note (Signed)
BMI 33.8

## 2013-01-22 NOTE — Assessment & Plan Note (Signed)
controlled 

## 2013-01-22 NOTE — Assessment & Plan Note (Signed)
At his last cath he did have ISR of the SVG-RI but no symptoms, plan is for medical Rx.

## 2013-01-22 NOTE — Assessment & Plan Note (Signed)
He says he is compliant with C-pap 

## 2013-01-22 NOTE — Assessment & Plan Note (Signed)
On oral agents 

## 2013-01-22 NOTE — Patient Instructions (Signed)
Same medications, follow up with Dr Allyson Sabal in 6 months.

## 2013-07-04 ENCOUNTER — Ambulatory Visit (INDEPENDENT_AMBULATORY_CARE_PROVIDER_SITE_OTHER): Payer: BC Managed Care – PPO | Admitting: Cardiovascular Disease

## 2013-07-04 ENCOUNTER — Encounter: Payer: Self-pay | Admitting: Cardiovascular Disease

## 2013-07-04 VITALS — BP 160/70 | HR 59 | Ht 70.0 in | Wt 238.7 lb

## 2013-07-04 DIAGNOSIS — E785 Hyperlipidemia, unspecified: Secondary | ICD-10-CM

## 2013-07-04 DIAGNOSIS — I2510 Atherosclerotic heart disease of native coronary artery without angina pectoris: Secondary | ICD-10-CM

## 2013-07-04 DIAGNOSIS — I10 Essential (primary) hypertension: Secondary | ICD-10-CM

## 2013-07-04 NOTE — Assessment & Plan Note (Signed)
Status post coronary bypass grafting in 1996. I stented the SVG to the ramus branch in 2006 and again 2011 for in-stent restenosis. I recast him/24/13 revealing in-stent restenosis at that time I elected to treat him medically. He's had no recurrent symptoms.

## 2013-07-04 NOTE — Progress Notes (Signed)
07/04/2013 [name] [name]   1955-12-14  [id number]  Primary Physician Josue Hector, MD Primary Cardiologist: Runell Gess MD [name] [name]   HPI:  The patient returns today for followup. He is a 57 year old, moderately overweight, married Caucasian male, father of 3, grandfather to 6 grandchildren who I last saw 6 months ago. He has a history of CAD status post bypass grafting in 1996. I stented the SVG to his ramus branch in 2006. His other problems include hypertension, hyperlipidemia and noninsulin-requiring diabetes as well as obstructive sleep apnea on CPAP. He had a Myoview stress test performed April 30, 2009, which showed new lateral ischemia. He was complaining of dyspnea on exertion at that time. I catheterized him September 10, 2009, revealing 75% in-stent restenosis within the stented segment which I restented with a drug-eluting stent. Myoview performed November 01, 2009, showed resolution of his ischemic abnormality. He was asymptomatic after that. A followup Myoview did show new anterolateral ischemia performed December 08, 2011, which led to a cath on December 13, 2011, that showed 95% in-stent restenosis within the previously restented segment. I elected to treat him medically. He has been asymptomatic since..he saw [name] [name] in the office 01/22/13. He's had no recurrent symptoms. Dr. Lysbeth [name], he is PCP follows his lipid profile    Current Outpatient Prescriptions  Medication Sig Dispense Refill  . aspirin EC 81 MG tablet Take 81 mg by mouth 2 (two) times daily.       . CELEBREX 200 MG capsule Take 200 mg by mouth daily.      . clopidogrel (PLAVIX) 75 MG tablet Take 75 mg by mouth daily.      [name] [name] escitalopram (LEXAPRO) 20 MG tablet Take 20 mg by mouth daily.      . famotidine (PEPCID) 20 MG tablet Take 20 mg by mouth 2 (two) times daily.      . fish oil-omega-3 fatty acids 1000 MG capsule Take 1 g by mouth 2 (two) times daily.      . insulin glargine (LANTUS) 100  UNIT/ML injection Inject 80 Units into the skin at bedtime.       [name] [name] JANUMET 50-1000 MG per tablet Take 1 tablet by mouth 2 (two) times daily with a meal.       . losartan (COZAAR) 100 MG tablet Take 100 mg by mouth daily.      . metoprolol succinate (TOPROL-XL) 25 MG 24 hr tablet Take 25 mg by mouth daily.      . Multiple Vitamin (MULITIVITAMIN WITH MINERALS) TABS Take 1 tablet by mouth daily.      . rosuvastatin (CRESTOR) 5 MG tablet Take 5 mg by mouth daily.       No current facility-administered medications for this visit.    Allergies  Allergen Reactions  . Hydrochlorothiazide Itching  . Hydrocodone-Acetaminophen Swelling    Swelling of hands and feet followed by skin peeling off of hands and feet    History   Social History  . Marital Status: Married    Spouse Name: N/A    Number of Children: N/A  . Years of Education: N/A   Occupational History  . Not on file.   Social History Main Topics  . Smoking status: Former Smoker    Quit date: 02/19/1995  . Smokeless tobacco: Never Used  . Alcohol Use: No  . Drug Use: No  . Sexual Activity: Not on file   Other Topics Concern  . Not on file  Social History Narrative  . No narrative on file     Review of Systems: General: negative for chills, fever, night sweats or weight changes.  Cardiovascular: negative for chest pain, dyspnea on exertion, edema, orthopnea, palpitations, paroxysmal nocturnal dyspnea or shortness of breath Dermatological: negative for rash Respiratory: negative for cough or wheezing Urologic: negative for hematuria Abdominal: negative for nausea, vomiting, diarrhea, bright red blood per rectum, melena, or hematemesis Neurologic: negative for visual changes, syncope, or dizziness All other systems reviewed and are otherwise negative except as noted above.    Blood pressure 160/70, pulse 59, height 5\' 10"  (1.778 m), weight 238 lb 11.2 oz (108.274 kg).  General appearance: alert and no  distress Neck: no adenopathy, no carotid bruit, no JVD, supple, symmetrical, trachea midline and thyroid not enlarged, symmetric, no tenderness/mass/nodules Lungs: clear to auscultation bilaterally Heart: regular rate and rhythm, S1, S2 normal, no murmur, click, rub or gallop Extremities: extremities normal, atraumatic, no cyanosis or edema Inus bradycardia at 59 with hypometabolic EKG sinus bradycardia at 59 with right bundle branch block unchanged from prior EKG  ASSESSMENT AND PLAN:   CAD, CABG '96. SVG-RI PCI '06, 1/11. Cath 4/13 with ISR- medical Rx only Status post coronary bypass grafting in 1996. I stented the SVG to the ramus branch in 2006 and again 2011 for in-stent restenosis. I recast him/24/13 revealing in-stent restenosis at that time I elected to treat him medically. He's had no recurrent symptoms.  HTN (hypertension) Moderately elevated on current medications.  Dyslipidemia On statin therapy followed by his PCP      Runell Gess MD [facility], [facility] 07/04/2013 5:25 PM

## 2013-07-04 NOTE — Assessment & Plan Note (Signed)
Moderately elevated on current medications 

## 2013-07-04 NOTE — Patient Instructions (Signed)
Your physician wants you to follow-up in: 6 months with an extender and 1 year with Dr Berry. You will receive a reminder letter in the mail two months in advance. If you don't receive a letter, please call our office to schedule the follow-up appointment.  

## 2013-07-04 NOTE — Assessment & Plan Note (Signed)
On statin therapy followed by his PCP 

## 2014-07-30 ENCOUNTER — Encounter (HOSPITAL_COMMUNITY): Payer: Self-pay | Admitting: Cardiovascular Disease

## 2014-08-25 ENCOUNTER — Ambulatory Visit (INDEPENDENT_AMBULATORY_CARE_PROVIDER_SITE_OTHER): Payer: BLUE CROSS/BLUE SHIELD | Admitting: Cardiovascular Disease

## 2014-08-25 ENCOUNTER — Encounter: Payer: Self-pay | Admitting: Cardiovascular Disease

## 2014-08-25 VITALS — BP 132/68 | HR 60 | Ht 70.0 in | Wt 230.0 lb

## 2014-08-25 DIAGNOSIS — I10 Essential (primary) hypertension: Secondary | ICD-10-CM

## 2014-08-25 DIAGNOSIS — I2510 Atherosclerotic heart disease of native coronary artery without angina pectoris: Secondary | ICD-10-CM

## 2014-08-25 DIAGNOSIS — E785 Hyperlipidemia, unspecified: Secondary | ICD-10-CM

## 2014-08-25 NOTE — Patient Instructions (Signed)
Dr Berry wants you to follow-up in 1 year. You will receive a reminder letter in the mail one months in advance. If you don't receive a letter, please call our office to schedule the follow-up appointment. 

## 2014-08-25 NOTE — Assessment & Plan Note (Signed)
History of hypertension with blood pressure today measured at 132/68. He is on metoprolol and losartan. Continue current meds at current dosing

## 2014-08-25 NOTE — Assessment & Plan Note (Signed)
History of coronary artery bypass grafting in 1996. I stented the SVG to the ramus branch in 2006. He had in-stent restenosis and I restented the previously stented segment 09/10/09. Because of follow-up Myoview showing anterolateral ischemia I re-cathed him 12/13/11 showing 95% "in-stent restenosis within the the knee previously stented ramus branch vein graft which I elected to treat medically. He has had no recurrent symptoms.

## 2014-08-25 NOTE — Progress Notes (Signed)
08/25/2014 [name] [name] Micciche   12/09/1955  [id number]  Primary Physician Josue HectorNYLAND,LEONARD ROBERT, MD Primary Cardiologist: Runell GessJonathan J. Berry MD Roseanne RenoFACP,FACC,FAHA, FSCAI   HPI:  The patient returns today for followup. He is a 59 year old, moderately overweight, married Caucasian male, father of 3, grandfather to 6 grandchildren who I last saw 12months ago. He has a history of CAD status post bypass grafting in 1996. I stented the SVG to his ramus branch in 2006. His other problems include hypertension, hyperlipidemia and noninsulin-requiring diabetes as well as obstructive sleep apnea on CPAP. He had a Myoview stress test performed April 30, 2009, which showed new lateral ischemia. He was complaining of dyspnea on exertion at that time. I catheterized him September 10, 2009, revealing 75% in-stent restenosis within the stented segment which I restented with a drug-eluting stent. Myoview performed November 01, 2009, showed resolution of his ischemic abnormality. He was asymptomatic after that. A followup Myoview did show new anterolateral ischemia performed December 08, 2011, which led to a cath on December 13, 2011, that showed 95% in-stent restenosis within the previously restented segment. I elected to treat him medically. He has been asymptomatic since.Dr. Lysbeth GalasNyland follows his lipid profile  Current Outpatient Prescriptions  Medication Sig Dispense Refill  . aspirin EC 81 MG tablet Take 81 mg by mouth 2 (two) times daily.     . clopidogrel (PLAVIX) 75 MG tablet Take 75 mg by mouth daily.    Marland Kitchen. escitalopram (LEXAPRO) 20 MG tablet Take 20 mg by mouth daily.    . famotidine (PEPCID) 20 MG tablet Take 20 mg by mouth 2 (two) times daily.    . fish oil-omega-3 fatty acids 1000 MG capsule Take 1 g by mouth 2 (two) times daily.    . insulin glargine (LANTUS) 100 UNIT/ML injection Inject 80 Units into the skin at bedtime.     . INVOKANA 300 MG TABS tablet   4  . JANUMET 50-1000 MG per tablet Take 1 tablet by mouth 2  (two) times daily with a meal.     . losartan (COZAAR) 100 MG tablet Take 100 mg by mouth daily.    . metoprolol succinate (TOPROL-XL) 25 MG 24 hr tablet Take 25 mg by mouth daily.    . Multiple Vitamin (MULITIVITAMIN WITH MINERALS) TABS Take 1 tablet by mouth daily.    . rosuvastatin (CRESTOR) 5 MG tablet Take 5 mg by mouth daily.     No current facility-administered medications for this visit.    Allergies  Allergen Reactions  . Hydrochlorothiazide Itching  . Hydrocodone-Acetaminophen Swelling    Swelling of hands and feet followed by skin peeling off of hands and feet    History   Social History  . Marital Status: Married    Spouse Name: N/A    Number of Children: N/A  . Years of Education: N/A   Occupational History  . Not on file.   Social History Main Topics  . Smoking status: Former Smoker    Quit date: 02/19/1995  . Smokeless tobacco: Never Used  . Alcohol Use: No  . Drug Use: No  . Sexual Activity: Not on file   Other Topics Concern  . Not on file   Social History Narrative     Review of Systems: General: negative for chills, fever, night sweats or weight changes.  Cardiovascular: negative for chest pain, dyspnea on exertion, edema, orthopnea, palpitations, paroxysmal nocturnal dyspnea or shortness of breath Dermatological: negative for rash Respiratory: negative for cough  or wheezing Urologic: negative for hematuria Abdominal: negative for nausea, vomiting, diarrhea, bright red blood per rectum, melena, or hematemesis Neurologic: negative for visual changes, syncope, or dizziness All other systems reviewed and are otherwise negative except as noted above.    Blood pressure 132/68, pulse 60, height  (1.778 m), weight 230 lb (104.327 kg).  General appearance: alert and no distress Neck: no adenopathy, no carotid bruit, no JVD, supple, symmetrical, trachea midline and thyroid not enlarged, symmetric, no tenderness/mass/nodules Lungs: clear to  auscultation bilaterally Heart: regular rate and rhythm, S1, S2 normal, no murmur, click, rub or gallop Extremities: extremities normal, atraumatic, no cyanosis or edema  EKG normal sinus rhythm at 60 per palpable branch block unchanged from prior EKGs. I personally reviewed this EKG  ASSESSMENT AND PLAN:   RBBB History of chronic right bundle-branch block present on today's EKG  HTN (hypertension) History of hypertension with blood pressure today measured at 132/68. He is on metoprolol and losartan. Continue current meds at current dosing  Dyslipidemia History of hyperlipidemia on simvastatin 5 mg a day followed by his PCP  CAD, CABG '96. SVG-RI PCI '06, 1/11. Cath 4/13 with ISR- medical Rx only History of coronary artery bypass grafting in 1996. I stented the SVG to the ramus branch in 2006. He had in-stent restenosis and I restented the previously stented segment 09/10/09. Because of follow-up Myoview showing anterolateral ischemia I re-cathed him 12/13/11 showing 95% "in-stent restenosis within the the knee previously stented ramus branch vein graft which I elected to treat medically. He has had no recurrent symptoms.      Runell Gess MD FACP,FACC,FAHA, [facility] 08/25/2014 4:50 PM

## 2014-08-25 NOTE — Assessment & Plan Note (Signed)
History of hyperlipidemia on simvastatin 5 mg a day followed by his PCP 

## 2014-08-25 NOTE — Assessment & Plan Note (Signed)
History of chronic right bundle-branch block present on today's EKG

## 2014-08-28 ENCOUNTER — Encounter: Payer: Self-pay | Admitting: Cardiovascular Disease

## 2015-01-30 ENCOUNTER — Ambulatory Visit (HOSPITAL_COMMUNITY)
Admission: EM | Admit: 2015-01-30 | Discharge: 2015-01-30 | Disposition: A | Discharge status: Home / Self Care | Payer: BLUE CROSS/BLUE SHIELD | Attending: Emergency Medicine | Admitting: Emergency Medicine

## 2015-01-30 ENCOUNTER — Encounter (HOSPITAL_COMMUNITY)
Admission: EM | Disposition: A | Discharge status: Home / Self Care | Payer: Self-pay | Source: Home / Self Care | Attending: Emergency Medicine

## 2015-01-30 ENCOUNTER — Emergency Department (HOSPITAL_COMMUNITY): Payer: BLUE CROSS/BLUE SHIELD

## 2015-01-30 ENCOUNTER — Encounter (HOSPITAL_COMMUNITY): Payer: Self-pay | Admitting: Emergency Medicine

## 2015-01-30 DIAGNOSIS — Z7902 Long term (current) use of antithrombotics/antiplatelets: Secondary | ICD-10-CM | POA: Insufficient documentation

## 2015-01-30 DIAGNOSIS — E785 Hyperlipidemia, unspecified: Secondary | ICD-10-CM | POA: Diagnosis not present

## 2015-01-30 DIAGNOSIS — E119 Type 2 diabetes mellitus without complications: Secondary | ICD-10-CM | POA: Diagnosis not present

## 2015-01-30 DIAGNOSIS — Z794 Long term (current) use of insulin: Secondary | ICD-10-CM | POA: Diagnosis not present

## 2015-01-30 DIAGNOSIS — Z87891 Personal history of nicotine dependence: Secondary | ICD-10-CM | POA: Insufficient documentation

## 2015-01-30 DIAGNOSIS — R1310 Dysphagia, unspecified: Secondary | ICD-10-CM | POA: Diagnosis present

## 2015-01-30 DIAGNOSIS — Y929 Unspecified place or not applicable: Secondary | ICD-10-CM | POA: Diagnosis not present

## 2015-01-30 DIAGNOSIS — Z7982 Long term (current) use of aspirin: Secondary | ICD-10-CM | POA: Diagnosis not present

## 2015-01-30 DIAGNOSIS — Z79899 Other long term (current) drug therapy: Secondary | ICD-10-CM | POA: Insufficient documentation

## 2015-01-30 DIAGNOSIS — I4510 Unspecified right bundle-branch block: Secondary | ICD-10-CM | POA: Insufficient documentation

## 2015-01-30 DIAGNOSIS — Z955 Presence of coronary angioplasty implant and graft: Secondary | ICD-10-CM | POA: Insufficient documentation

## 2015-01-30 DIAGNOSIS — T18128A Food in esophagus causing other injury, initial encounter: Secondary | ICD-10-CM | POA: Diagnosis not present

## 2015-01-30 DIAGNOSIS — Z888 Allergy status to other drugs, medicaments and biological substances status: Secondary | ICD-10-CM | POA: Diagnosis not present

## 2015-01-30 DIAGNOSIS — G4730 Sleep apnea, unspecified: Secondary | ICD-10-CM | POA: Diagnosis not present

## 2015-01-30 DIAGNOSIS — X58XXXA Exposure to other specified factors, initial encounter: Secondary | ICD-10-CM | POA: Insufficient documentation

## 2015-01-30 DIAGNOSIS — I10 Essential (primary) hypertension: Secondary | ICD-10-CM | POA: Insufficient documentation

## 2015-01-30 DIAGNOSIS — I2510 Atherosclerotic heart disease of native coronary artery without angina pectoris: Secondary | ICD-10-CM | POA: Insufficient documentation

## 2015-01-30 DIAGNOSIS — K222 Esophageal obstruction: Secondary | ICD-10-CM | POA: Diagnosis not present

## 2015-01-30 HISTORY — PX: ESOPHAGOGASTRODUODENOSCOPY: SHX5428

## 2015-01-30 LAB — CBG MONITORING, ED: Glucose-Capillary: 218 mg/dL — ABNORMAL HIGH (ref 65–99)

## 2015-01-30 LAB — I-STAT CHEM 8, ED
BUN: 18 mg/dL (ref 6–20)
Calcium, Ion: 1.18 mmol/L (ref 1.13–1.30)
Chloride: 103 mmol/L (ref 101–111)
Creatinine, Ser: 1 mg/dL (ref 0.61–1.24)
GLUCOSE: 256 mg/dL — AB (ref 65–99)
HCT: 48 % (ref 39.0–52.0)
Hemoglobin: 16.3 g/dL (ref 13.0–17.0)
POTASSIUM: 3.6 mmol/L (ref 3.5–5.1)
Sodium: 140 mmol/L (ref 135–145)
TCO2: 20 mmol/L (ref 0–100)

## 2015-01-30 SURGERY — EGD (ESOPHAGOGASTRODUODENOSCOPY)
Anesthesia: Moderate Sedation

## 2015-01-30 MED ORDER — FENTANYL CITRATE (PF) 100 MCG/2ML IJ SOLN
INTRAMUSCULAR | Status: DC | PRN
  Administered 2015-01-30 (×3): 25 ug via INTRAVENOUS

## 2015-01-30 MED ORDER — GLUCAGON HCL RDNA (DIAGNOSTIC) 1 MG IJ SOLR
0.5000 mg | Freq: Once | INTRAMUSCULAR | Status: AC
  Administered 2015-01-30: 0.5 mg via INTRAVENOUS

## 2015-01-30 MED ORDER — MIDAZOLAM HCL 5 MG/ML IJ SOLN
INTRAMUSCULAR | Status: AC
  Filled 2015-01-30: qty 2

## 2015-01-30 MED ORDER — GLUCAGON HCL RDNA (DIAGNOSTIC) 1 MG IJ SOLR
1.0000 mg | Freq: Once | INTRAMUSCULAR | Status: DC
  Filled 2015-01-30: qty 1

## 2015-01-30 MED ORDER — MIDAZOLAM HCL 10 MG/2ML IJ SOLN
INTRAMUSCULAR | Status: DC | PRN
  Administered 2015-01-30 (×3): 2 mg via INTRAVENOUS

## 2015-01-30 MED ORDER — FENTANYL CITRATE (PF) 100 MCG/2ML IJ SOLN
INTRAMUSCULAR | Status: AC
  Filled 2015-01-30: qty 2

## 2015-01-30 MED ORDER — DIPHENHYDRAMINE HCL 50 MG/ML IJ SOLN
INTRAMUSCULAR | Status: AC
  Filled 2015-01-30: qty 1

## 2015-01-30 MED ORDER — SODIUM CHLORIDE 0.9 % IV SOLN
Freq: Once | INTRAVENOUS | Status: AC
  Administered 2015-01-30: 18:00:00 via INTRAVENOUS

## 2015-01-30 MED ORDER — GLUCAGON HCL RDNA (DIAGNOSTIC) 1 MG IJ SOLR: 0.5000 mg | Freq: Once | INTRAMUSCULAR | Status: DC

## 2015-01-30 NOTE — Interval H&P Note (Signed)
History and Physical Interval Note:  01/30/2015 7:25 PM  [name]  has presented today for surgery, with the diagnosis of Food impaction  The various methods of treatment have been discussed with the patient and family. After consideration of risks, benefits and other options for treatment, the patient has consented to  Procedure(s): ESOPHAGOGASTRODUODENOSCOPY (EGD) (N/A) as a surgical intervention .  The patient's history has been reviewed, patient examined, no change in status, stable for surgery.  I have reviewed the patient's chart and labs.  Questions were answered to the patient's satisfaction.     [name] [name] C.

## 2015-01-30 NOTE — ED Notes (Signed)
ED charting resumed

## 2015-01-30 NOTE — ED Notes (Signed)
Wife at bedside speaking with GI

## 2015-01-30 NOTE — Discharge Instructions (Addendum)
Liquid diet only today. Change to soft solids tomorrow and advance as tolerated. Avoid all meats until further notice. Repeat endoscopy needed in 7-14 days and patient plans to call his GI doctor near him in [place]. Discussed findings and recs with his wife.

## 2015-01-30 NOTE — ED Notes (Signed)
The patient said he was eating steak and it is stuck in his easophagus.  He cannot swallow anything he even has to spit out his saliva.  He has strictures in the past and has gotten them stretched but he is having problems today.  He denies SOB but does have breathing difficulty at times.  He does admit to some pain.

## 2015-01-30 NOTE — Op Note (Signed)
[facility] [address] [place], [address]   ENDOSCOPY PROCEDURE REPORT  PATIENT: [name] [name], [name] [name]  MR#: [id number] BIRTHDATE: [birth year] , 59  yrs. old GENDER: male ENDOSCOPIST: Charlott Rakes, MD REFERRED BY: PROCEDURE DATE:  01/30/2015 PROCEDURE:  EGD w/ fb removal ASA CLASS:     Class III INDICATIONS:  foreign body removal from esophagus and dysphagia. MEDICATIONS: Fentanyl 75 mcg IV and Versed 6 mg IV TOPICAL ANESTHETIC:  DESCRIPTION OF PROCEDURE: After the risks benefits and alternatives of the procedure were thoroughly explained, informed consent was obtained.  The Pentax Gastroscope [id number] endoscope was introduced through the mouth and advanced to the second portion of the duodenum , Without limitations.  The instrument was slowly withdrawn as the mucosa was fully examined. Estimated blood loss is zero unless otherwise noted in this procedure report.    Large food bolus noted in the narrowed distal esophagus that obscured the lumen. A Lucina Mellow net was used to removed 2 large pieces of food and then a 4-pronged talon grasper was used to removed additional portions of food. The remaining food (small amount) was irrigated into the stomach and a distal short esophageal stricture was noted. The stricture was edematous and ulcerated consistent with trauma from the food impaction. The endoscope (9.8 mm diameter) was advanced through the GEJ (38 cm from the incisors) with mild resistance and advanced into the stomach. Stomach normal in appearance. Duodenal bulb and 2nd portion of the duodenum normal in appearance.        Retroflexed views revealed no abnormalities. During withdrawal the mucosa of the stricture was noted to be fractured on one side (image 11) consistent with dilation by the endoscope during passage of the endoscope distal to the stricture. The scope was then withdrawn from the patient and the procedure completed.  COMPLICATIONS:  There were no immediate complications.  ENDOSCOPIC IMPRESSION:     Food Impaction - removed as stated above Distal esophageal stricture  RECOMMENDATIONS:     Repeat EGD for dilation in 1-2 weeks; Liquid diet today and soft solids tomorrow; Avoid meats until dilation   eSigned:  Charlott Rakes, MD 01/30/2015 8:37 PM    CC:R.  Roetta Sessions, MD  CPT CODES: ICD CODES:  The ICD and CPT codes recommended by this software are interpretations from the data that the clinical staff has captured with the software.  The verification of the translation of this report to the ICD and CPT codes and modifiers is the sole responsibility of the health care institution and practicing physician where this report was generated.  [employer] will not be held responsible for the validity of the ICD and CPT codes included on this report.  AMA assumes no liability for data contained or not contained herein. CPT is a Publishing rights manager of the [employer].  PATIENT NAME:  [name] [name], [name] [name] MR#: [id number]

## 2015-01-30 NOTE — Progress Notes (Signed)
Patient ID: [name] [name], male   DOB: 10/02/1955, 59 y.o.   MRN: [id number] [facility] Progress Note  [name] [name] 59 y.o. 06/25/1956   Subjective: History of esophageal stricture with dilation in 2009 and previous food impactions coming in to the hospital after swallowing a piece of steak that he felt like it hung up. States that he ate bread and salad before the steak and felt like the bread and salad went down ok. After the first bite of steak the food hung up and he could not keep down any secretions or fluids and could not vomit up the food. On Plavix for heart stents.  Past Medical History  Diagnosis Date  . Coronary artery disease     post bypass grafting in 1996-.SVG was stented to his ramus branch in 2006   . Hypertension   . Hyperlipidemia   . Diabetes mellitus without complication     noninsulin -requiring diabetes   . Sleep apnea     pt has a cpap machine  . Dyspnea     MYOVIEW STRESS TEST--PERFORMED 04/30/2009--WHICH SHOWED NEW LATERAL ISCHEMIA.   . Chest pain        . Right bundle branch block        Meds: reviewed (see MAR)   Objective: Vital signs in last 24 hours: Filed Vitals:   01/30/15 1903  BP: 158/63  Pulse: 65  Temp: 97.7  Resp: 18    Physical Exam: Gen: alert, no acute distress, well-nourished HEENT: anicteric, oropharynx clear Neck: supple, nontender CV: RRR Chest: CTA B Abd: soft, nontender, nondistended, +BS Ext: no edema  Lab Results:  Recent Labs  01/30/15 1808  NA 140  K 3.6  CL 103  GLUCOSE 256*  BUN 18  CREATININE 1.00   No results for input(s): AST, ALT, ALKPHOS, BILITOT, PROT, ALBUMIN in the last 72 hours.  Recent Labs  01/30/15 1808  HGB 16.3  HCT 48.0   No results for input(s): LABPROT, INR in the last 72 hours.    Assessment/Plan: 59 yo with history of esophageal stricture who has a food impaction while eating steak. EGD in ER to remove the food  impaction and will need repeat EGD in the near future for dilation if a stricture or ring is seen.   Jashae Wiggs C. 01/30/2015, 7:18 PM  Pager [phone]  If no answer or after 5 PM call [phone]  

## 2015-01-30 NOTE — ED Notes (Addendum)
Patients chart has been moved to endo

## 2015-01-30 NOTE — H&P (View-Only) (Signed)
Patient ID: [name] [name], male   DOB: 03/20/1956, 59 y.o.   MRN: [id number] [facility] Progress Note  NHIA HEYMANN 59 y.o. Jan 09, 1956   Subjective: History of esophageal stricture with dilation in 2009 and previous food impactions coming in to the hospital after swallowing a piece of steak that he felt like it hung up. States that he ate bread and salad before the steak and felt like the bread and salad went down ok. After the first bite of steak the food hung up and he could not keep down any secretions or fluids and could not vomit up the food. On Plavix for heart stents.  Past Medical History  Diagnosis Date  . Coronary artery disease     post bypass grafting in 1996-.SVG was stented to his ramus branch in 2006   . Hypertension   . Hyperlipidemia   . Diabetes mellitus without complication     noninsulin -requiring diabetes   . Sleep apnea     pt has a cpap machine  . Dyspnea     MYOVIEW STRESS TEST--PERFORMED 04/30/2009--WHICH SHOWED NEW LATERAL ISCHEMIA.   Marland Kitchen Chest pain        . Right bundle branch block        Meds: reviewed (see MAR)   Objective: Vital signs in last 24 hours: Filed Vitals:   01/30/15 1903  BP: 158/63  Pulse: 65  Temp: 97.7  Resp: 18    Physical Exam: Gen: alert, no acute distress, well-nourished HEENT: anicteric, oropharynx clear Neck: supple, nontender CV: RRR Chest: CTA B Abd: soft, nontender, nondistended, +BS Ext: no edema  Lab Results:  Recent Labs  01/30/15 1808  NA 140  K 3.6  CL 103  GLUCOSE 256*  BUN 18  CREATININE 1.00   No results for input(s): AST, ALT, ALKPHOS, BILITOT, PROT, ALBUMIN in the last 72 hours.  Recent Labs  01/30/15 1808  HGB 16.3  HCT 48.0   No results for input(s): LABPROT, INR in the last 72 hours.    Assessment/Plan: 59 yo with history of esophageal stricture who has a food impaction while eating steak. EGD in ER to remove the food  impaction and will need repeat EGD in the near future for dilation if a stricture or ring is seen.   Julanne Schlueter C. 01/30/2015, 7:18 PM  Pager [phone]  If no answer or after 5 PM call [phone]

## 2015-01-30 NOTE — ED Provider Notes (Signed)
CSN: [id number]     Arrival date & time 01/30/15  1627 History   First MD Initiated Contact with Patient 01/30/15 1640     Chief Complaint  Patient presents with  . Airway Obstruction    The patient said he was eating steak and it is stuck in his easophagus.  He cannot swallow anything he even has to spit out his saliva.     (Consider location/radiation/quality/duration/timing/severity/associated sxs/prior Treatment) The history is provided by the patient.    [name] [name] is a 59 yo M with PMH of esophageal stricture and dilatation in 2010, , CAD, CABG '96, DM2, and HLD. He presents with an esophageal obstruction. He has had progression of these symptoms over the past few months but they would resolve on their own. Today after eating salad and breadsticks, he started eating steak and the first bite became lodged.  He is unable to pass it by vomiting, beating on his chest or walking up and down stairs.  He has centralized esophageal burning pain that radiates proximally. He has had nausea and vomiting since the steak became lodged.  This started at about 3 pm today.  This feels like his previous episodes.  He denies any pain radiating to his left arm or neck pain, no diaphoresis, unexplained weight loss, changes in bowel movement, dysuria.    Past Medical History  Diagnosis Date  . Coronary artery disease     post bypass grafting in 1996-.SVG was stented  to his ramus branch in 2006     . Hypertension   . Hyperlipidemia   . Diabetes mellitus without complication     noninsulin -requiring diabetes   . Sleep apnea     pt has a cpap machine  . Dyspnea     MYOVIEW STRESS TEST--PERFORMED 04/30/2009--WHICH SHOWED  NEW LATERAL ISCHEMIA.   Marland Kitchen Chest pain         . Right bundle branch block    Past Surgical History  Procedure Laterality Date  . Cardiovascular stress test  SEPT 10 2010    NEW LATERAL ISCHEMIA  . Cardiovascular stress test  November 01 2009    SHOWED RESOULTION OF HIS ISCHEMIA  ABNORMALITY. PT WAS ASYMPTOMATIC  AFTER THAT  . Cardiovascular stress test  December 08 2011    MYOVIEW SHOWED NEW ANTEROLATERAL ISCHEMIA (LED TO A CATH)  . Cardiac catheterization  01 /21/2011     REVEALING 75 % IN - STENT RESTENOSIS WITHIN  THE STENTED SEGMENT WHICH I RESTENTED WITH A DRUG ELUTING STENT   . Cardiac catheterization  04/ 24 /2013    THAT SHOWED 95 % IN STENT RESTENOSIS WITHIN THE PREVIOUSLY RESTENTED SEGMENT . iI ELECTED TO TREAT HIM MEDICALLY/ PT ASYMPTOMATIC SINCE  . Uvulopalatopharyngoplasty    . Sleep  study report  05/03/2007    The patient has undergone a diagnostic polysomnogram which confirmed the diagnosis of sleep disordered breathing with an AHI of 5.4/hr and 24.91/hr during REM sleep  . Sleep study  07/ 06/2007    The (AHI) formerly known as RDI , during the total sleep time  6 h 17 min  was 5.41/ hr and during Rem sleep at 24.91/hr .This places the patient into the mild sleep apnea category . The average oxygen saturation range   . Sleep study  07 11 2008    (cont-) range during REM was 91 % The lowest oxygen saturation during NON-REM and REM sleep was 86% and 87 % respectively . The patient was  obsered snoring loudly  . Adult echocardiography  11/08/2004    The left ventricle is normal size .There is normal left ventricular wall thickness .EJECTION FRACTION =>55%.The LA is mildly dilated .The main PA is imaged to the bicfucation into right and left branches with normal size noted.Based on an assumed RA pressure orf 10 and a end diastolic PR gradient of 1.2 m/sec the PA end diastolic pressure is estimated to be ~16 which is top normal  . Carotid doppler  11 /23/2013    Summay -Bilateral Bulb ICA's demonstrated a mild amount of fibrous plaquewith no evidence of significant diameter  reduction ,tortuosity or any other vascular abnormality. This is midly abnormal carotid duple doppler eval. When compared to the previous study dated 08/12/2007,there are no significant changes  noted.Noted it is recommend that this test be repeated when clinically indicated aque   . Left heart catheterization with coronary angiogram N/A 12/13/2011    Procedure: LEFT HEART CATHETERIZATION WITH CORONARY ANGIOGRAM;  Surgeon: Runell Gess, MD;  Location: [facility] C[facility] LAB;  Service: Cardiovascular;  Laterality: N/A;   History reviewed. No pertinent family history. History  Substance Use Topics  . Smoking status: Former Smoker    Quit date: 02/19/1995  . Smokeless tobacco: Never Used  . Alcohol Use: No    Review of Systems  Constitutional: Negative for fever and diaphoresis.  HENT: Positive for trouble swallowing. Negative for drooling.   Respiratory: Negative for chest tightness and shortness of breath.   Cardiovascular: Negative for chest pain.  Gastrointestinal: Positive for nausea and vomiting. Negative for abdominal pain, diarrhea and constipation.  Genitourinary: Negative for dysuria.  Skin: Negative for rash.  Neurological: Negative for weakness.      Allergies  Hydrocodone-acetaminophen; Pravastatin sodium; and Hydrochlorothiazide  Home Medications   Prior to Admission medications   Medication Sig Start Date End Date Taking? Authorizing Provider  aspirin EC 81 MG tablet Take 81 mg by mouth 2 (two) times daily.    Yes Historical Provider, MD  clopidogrel (PLAVIX) 75 MG tablet Take 75 mg by mouth daily.   Yes Historical Provider, MD  escitalopram (LEXAPRO) 20 MG tablet Take 20 mg by mouth at bedtime.    Yes Historical Provider, MD  famotidine (PEPCID) 20 MG tablet Take 20 mg by mouth 2 (two) times daily.   Yes Historical Provider, MD  fish oil-omega-3 fatty acids 1000 MG capsule Take 1 g by mouth 2 (two) times daily.   Yes Historical Provider, MD  insulin glargine (LANTUS) 100 UNIT/ML injection Inject 50 Units into the skin at bedtime.    Yes Historical Provider, MD  INVOKANA 300 MG TABS tablet Take 300 mg by mouth daily before breakfast.  08/01/14  Yes Historical  Provider, MD  JANUMET 50-1000 MG per tablet Take 1 tablet by mouth 2 (two) times daily with a meal.  01/21/13  Yes Historical Provider, MD  losartan (COZAAR) 100 MG tablet Take 100 mg by mouth every evening.  11/28/12  Yes Historical Provider, MD  metoprolol succinate (TOPROL-XL) 25 MG 24 hr tablet Take 25 mg by mouth at bedtime.    Yes Historical Provider, MD  Multiple Vitamin (MULITIVITAMIN WITH MINERALS) TABS Take 1 tablet by mouth daily.   Yes Historical Provider, MD  rosuvastatin (CRESTOR) 5 MG tablet Take 5 mg by mouth at bedtime.    Yes Historical Provider, MD   BP 144/57 mmHg  Pulse 65  Temp(Src) 97.7 F (36.5 C) (Oral)  Resp 17  SpO2 98% Physical Exam  Constitutional: He is oriented to person, place, and time. He appears well-developed and well-nourished.  HENT:  Head: Normocephalic and atraumatic.  Eyes: Conjunctivae and EOM are normal.  Neck: Normal range of motion. No tracheal deviation present.  Cardiovascular: Normal rate, regular rhythm, normal heart sounds and intact distal pulses.   No murmur heard. Pulmonary/Chest: Effort normal and breath sounds normal. He has no wheezes. He has no rales.  Abdominal: Soft. Bowel sounds are normal. There is no tenderness. There is no rebound and no guarding.  Musculoskeletal: Normal range of motion. He exhibits no edema.  Neurological: He is alert and oriented to person, place, and time.  Skin: Skin is warm. No rash noted.    ED Course  Procedures (including critical care time) Labs Review Labs Reviewed  I-STAT CHEM 8, ED - Abnormal; Notable for the following:    Glucose, Bld 256 (*)    All other components within normal limits  CBG MONITORING, ED - Abnormal; Notable for the following:    Glucose-Capillary 218 (*)    All other components within normal limits    Imaging Review Dg Chest Port 1 View  01/30/2015   CLINICAL DATA:  Steak stuck in esophagus today. Unable to swallow. Chest discomfort.  EXAM: PORTABLE CHEST - 1 VIEW   COMPARISON:  12/08/2011 and prior radiographs  FINDINGS: Cardiomegaly and CABG changes noted.  There is no evidence of focal airspace disease, pulmonary edema, suspicious pulmonary nodule/mass, pleural effusion, pneumomediastinum or pneumothorax. No acute bony abnormalities are identified.  IMPRESSION: Cardiomegaly without evidence of acute cardiopulmonary disease.   Electronically Signed   By: Harmon Pier M.D.   On: 01/30/2015 17:37     EKG Interpretation None       Medications  0.9 %  sodium chloride infusion ( Intravenous New Bag/Given 01/30/15 1801)  glucagon (human recombinant) (GLUCAGEN) injection 0.5 mg (0.5 mg Intravenous Given 01/30/15 1824)    MDM   Final diagnoses:  Food impaction of esophagus, initial encounter    [name] [name] is presenting with esophageal obstruction with hx of stricture. Denies any shortness of breath. Central burning pain that started since the piece of steak became lodged.  He is having nausea and emesis since that event as well. Will get labs, CXR, EKG, and consulted GI.   Will give 0.5 mg glucagon IV. Patient still symptomatic. GI to perform EGD.   GI given instructions post EGD. Patient agreeable with plan and discharge.   Myra Rude, MD PGY-2, [facility] 01/30/2015, 8:37 PM         Myra Rude, MD 01/30/15 1610  Nelva Nay, MD 01/30/15 2122

## 2015-01-30 NOTE — ED Notes (Signed)
Vital signs stable. 

## 2015-01-30 NOTE — ED Notes (Signed)
  CBG 218  

## 2015-02-01 ENCOUNTER — Encounter (HOSPITAL_COMMUNITY): Payer: Self-pay | Admitting: Gastroenterology

## 2015-02-01 LAB — CBG MONITORING, ED: Glucose-Capillary: 226 mg/dL — ABNORMAL HIGH (ref 65–99)

## 2015-02-19 ENCOUNTER — Encounter: Payer: Self-pay | Admitting: Internal Medicine

## 2015-03-12 ENCOUNTER — Ambulatory Visit: Payer: BLUE CROSS/BLUE SHIELD | Admitting: Gastroenterology

## 2015-04-01 ENCOUNTER — Encounter: Payer: Self-pay | Admitting: Gastroenterology

## 2015-04-01 ENCOUNTER — Other Ambulatory Visit: Payer: Self-pay

## 2015-04-01 ENCOUNTER — Ambulatory Visit (INDEPENDENT_AMBULATORY_CARE_PROVIDER_SITE_OTHER): Payer: BLUE CROSS/BLUE SHIELD | Admitting: Gastroenterology

## 2015-04-01 VITALS — BP 131/56 | HR 59 | Temp 97.6°F | Ht 70.0 in | Wt 225.2 lb

## 2015-04-01 DIAGNOSIS — K2100 Gastro-esophageal reflux disease with esophagitis, without bleeding: Secondary | ICD-10-CM

## 2015-04-01 DIAGNOSIS — T18128D Food in esophagus causing other injury, subsequent encounter: Secondary | ICD-10-CM

## 2015-04-01 DIAGNOSIS — K222 Esophageal obstruction: Secondary | ICD-10-CM

## 2015-04-01 DIAGNOSIS — K219 Gastro-esophageal reflux disease without esophagitis: Secondary | ICD-10-CM

## 2015-04-01 NOTE — Progress Notes (Signed)
Primary Care Physician:  Josue Hector, MD  Primary Gastroenterologist:  Roetta Sessions, MD   Chief Complaint  Patient presents with  . OTHER    ? EGD    HPI:  [name] [name] is a 59 y.o. male here to schedule EGD. Patient recently required EGD with foreign body removal (date) back in June 2016. Procedure was performed by Dr. Charlott Rakes. Patient's primary gastroenterologist is Dr. Jena Gauss. Patient has a history of Schatzki ring requiring dilation in the past. This is actually a second time that he had a food impaction requiring emergent EGD over the past 6 or 7 years.  Recent EGD with foreign body removal, noted to have narrowed distal esophagus with short stricture. Stricture was edematous and ulcerated consistent with trauma from the food impaction. Partial dilation with passage of the scope. Advised to come back in 1-2 weeks for EGD with dilation.  Patient previously was on PPI therapy, I believe omeprazole. Advised by cardiology several years ago to stop due to his risk of decreased efficacy of the Plavix. Patient has a history of in-stent restenosis being treated medically. Prior remote coronary artery bypass graft as well.  Heartburn only with certain foods if eat late. Takes Pepcid once daily. Chews food thoroughly. Usually will note the first bite tenderness to get stuck and then eventually will go down and he has no problems with the rest the meal. Worse with bread and meats. Denies odynophagia except for with impacted food bolus. Episode that led to recent emergency department visit lasted for about 6 hours prior to his scope. Symptoms have been intermittent for several months. One episode since June. Denies abdominal pain. Bowel movements are regular. No blood in the stool or melena.  Current Outpatient Prescriptions  Medication Sig Dispense Refill  . aspirin EC 81 MG tablet Take 81 mg by mouth 2 (two) times daily.     Marland Kitchen atorvastatin (LIPITOR) 10 MG tablet Take 10 mg by  mouth daily.    . clopidogrel (PLAVIX) 75 MG tablet Take 75 mg by mouth daily.    Marland Kitchen escitalopram (LEXAPRO) 20 MG tablet Take 20 mg by mouth at bedtime.     . famotidine (PEPCID) 20 MG tablet Take 20 mg by mouth daily.     . fish oil-omega-3 fatty acids 1000 MG capsule Take 1 g by mouth daily.     . insulin glargine (LANTUS) 100 UNIT/ML injection Inject 50 Units into the skin at bedtime.     . INVOKANA 300 MG TABS tablet Take 300 mg by mouth daily before breakfast.   4  . JANUMET 50-1000 MG per tablet Take 1 tablet by mouth 2 (two) times daily with a meal.     . losartan (COZAAR) 100 MG tablet Take 100 mg by mouth every evening.     . metoprolol succinate (TOPROL-XL) 25 MG 24 hr tablet Take 25 mg by mouth at bedtime.     . Multiple Vitamin (MULITIVITAMIN WITH MINERALS) TABS Take 1 tablet by mouth daily.    . rosuvastatin (CRESTOR) 5 MG tablet Take 5 mg by mouth at bedtime.      No current facility-administered medications for this visit.    Allergies as of 04/01/2015 - Review Complete 04/01/2015  Allergen Reaction Noted  . Hydrocodone-acetaminophen Swelling   . Pravastatin sodium Other (See Comments) 01/30/2015  . Hydrochlorothiazide Itching     Past Medical History  Diagnosis Date  . Coronary artery disease     post bypass grafting in 1996-.SVG  was stented  to his ramus branch in 2006     . Hypertension   . Hyperlipidemia   . Diabetes mellitus without complication     noninsulin -requiring diabetes   . Sleep apnea     pt has a cpap machine  . Dyspnea     MYOVIEW STRESS TEST--PERFORMED 04/30/2009--WHICH SHOWED  NEW LATERAL ISCHEMIA.   Marland Kitchen Chest pain         . Right bundle branch block     Past Surgical History  Procedure Laterality Date  . Cardiovascular stress test  SEPT 10 2010    NEW LATERAL ISCHEMIA  . Cardiovascular stress test  November 01 2009    SHOWED RESOULTION OF HIS ISCHEMIA ABNORMALITY. PT WAS ASYMPTOMATIC  AFTER THAT  . Cardiovascular stress test  December 08 2011     MYOVIEW SHOWED NEW ANTEROLATERAL ISCHEMIA (LED TO A CATH)  . Cardiac catheterization  01 /21/2011     REVEALING 75 % IN - STENT RESTENOSIS WITHIN  THE STENTED SEGMENT WHICH I RESTENTED WITH A DRUG ELUTING STENT   . Cardiac catheterization  04/ 24 /2013    THAT SHOWED 95 % IN STENT RESTENOSIS WITHIN THE PREVIOUSLY RESTENTED SEGMENT . iI ELECTED TO TREAT HIM MEDICALLY/ PT ASYMPTOMATIC SINCE  . Sleep  study report  05/03/2007    The patient has undergone a diagnostic polysomnogram which confirmed the diagnosis of sleep disordered breathing with an AHI of 5.4/hr and 24.91/hr during REM sleep  . Sleep study  07/ 06/2007    The (AHI) formerly known as RDI , during the total sleep time  6 h 17 min  was 5.41/ hr and during Rem sleep at 24.91/hr .This places the patient into the mild sleep apnea category . The average oxygen saturation range   . Sleep study  07 11 2008    (cont-) range during REM was 91 % The lowest oxygen saturation during NON-REM and REM sleep was 86% and 87 % respectively . The patient was obsered snoring loudly  . Adult echocardiography  11/08/2004    The left ventricle is normal size .There is normal left ventricular wall thickness .EJECTION FRACTION =>55%.The LA is mildly dilated .The main PA is imaged to the bicfucation into right and left branches with normal size noted.Based on an assumed RA pressure orf 10 and a end diastolic PR gradient of 1.2 m/sec the PA end diastolic pressure is estimated to be ~16 which is top normal  . Carotid doppler  11 /23/2013    Summay -Bilateral Bulb ICA's demonstrated a mild amount of fibrous plaquewith no evidence of significant diameter  reduction ,tortuosity or any other vascular abnormality. This is midly abnormal carotid duple doppler eval. When compared to the previous study dated 08/12/2007,there are no significant changes noted.Noted it is recommend that this test be repeated when clinically indicated aque   . Left heart catheterization with  coronary angiogram N/A 12/13/2011    Procedure: LEFT HEART CATHETERIZATION WITH CORONARY ANGIOGRAM;  Surgeon: Runell Gess, MD;  Location: [facility] CATH LAB;  Service: Cardiovascular;  Laterality: N/A;  . Esophagogastroduodenoscopy N/A 01/30/2015    Dr. Charlott Rakes: food impaction-foreign body removal, distal esophageal stricture, ulcerated esophagus due to food impaction.   . Esophagogastroduodenoscopy  11/2007    Dr. Jena Gauss: Schatzki ring s/p dilation, small hh  . Colonoscopy  11/2007    Dr. Jena Gauss: normal. repeat 11/2017.  Marland Kitchen Esophagogastroduodenoscopy  10/2007    Dr. Carman Ching: food impaction, friable esophageal ring  Family History  Problem Relation Age of Onset  . Colon cancer Neg Hx   . Diabetes Mother   . Diabetes Brother   . Diabetes Sister     Social History   Social History  . Marital Status: Married    Spouse Name: N/A  . Number of Children: 0  . Years of Education: N/A   Occupational History  . maintenance on textile machines    Social History Main Topics  . Smoking status: Former Smoker    Quit date: 02/19/1995  . Smokeless tobacco: Never Used     Comment: Quit x 20 years  . Alcohol Use: No  . Drug Use: No  . Sexual Activity: Not on file   Other Topics Concern  . Not on file   Social History Narrative      ROS:  General: Negative for anorexia, weight loss, fever, chills, fatigue, weakness. Eyes: Negative for vision changes.  ENT: Negative for hoarseness, difficulty swallowing , nasal congestion. CV: Negative for chest pain, angina, palpitations, dyspnea on exertion, peripheral edema.  Respiratory: Negative for dyspnea at rest, dyspnea on exertion, cough, sputum, wheezing.  GI: See history of present illness. GU:  Negative for dysuria, hematuria, urinary incontinence, urinary frequency, nocturnal urination.  MS: Negative for joint pain, low back pain.  Derm: Negative for rash or itching.  Neuro: Negative for weakness, abnormal sensation, seizure,  frequent headaches, memory loss, confusion.  Psych: Negative for anxiety, depression, suicidal ideation, hallucinations.  Endo: Negative for unusual weight change.  Heme: Negative for bruising or bleeding. Allergy: Negative for rash or hives.    Physical Examination:  BP 131/56 mmHg  Pulse 59  Temp(Src) 97.6 F (36.4 C) (Oral)  Ht 5\' 10"  (1.778 m)  Wt 225 lb 3.2 oz (102.15 kg)  BMI 32.31 kg/m2   General: Well-nourished, well-developed in no acute distress.  Head: Normocephalic, atraumatic.   Eyes: Conjunctiva pink, no icterus. Mouth: Oropharyngeal mucosa moist and pink , no lesions erythema or exudate. Neck: Supple without thyromegaly, masses, or lymphadenopathy.  Lungs: Clear to auscultation bilaterally.  Heart: Regular rate and rhythm, no murmurs rubs or gallops.  Abdomen: Bowel sounds are normal, nontender, nondistended, no hepatosplenomegaly or masses, no abdominal bruits or    hernia , no rebound or guarding.   Rectal: not performed Extremities: No lower extremity edema. No clubbing or deformities.  Neuro: Alert and oriented x 4 , grossly normal neurologically.  Skin: Warm and dry, no rash or jaundice.   Psych: Alert and cooperative, normal mood and affect.  Labs: Lab Results  Component Value Date         HGB 16.3 01/30/2015   HCT 48.0 01/30/2015                 Imaging Studies: No results found.

## 2015-04-01 NOTE — Assessment & Plan Note (Addendum)
59 year old gentleman with history of recurrent esophageal stricture/ring requiring multiple dilations as well as to emergent food disimpactions. Most recent EGD with food bolus removal June 2016 by Dr. Bosie Clos. Comes back now for EGD with dilation.  I have discussed the risks, alternatives, benefits with regards to but not limited to the risk of reaction to medication, bleeding, infection, perforation and the patient is agreeable to proceed. Written consent to be obtained. Specifically discussed the increased risk of bleeding post-esophageal dilation while on Plavix/ASA although still minimal. Given his h/o in-stent restenosis/CAD benefits of staying on Plavix/ASA (not holding for the EGD/ED) outweigh the risk of bleeding.   Has been maintained on Pepcid for typical reflux symptoms with good control. Taken off of omeprazole previously due 2 risk of decreased efficacy of Plavix.

## 2015-04-01 NOTE — Patient Instructions (Signed)
1. Upper endoscopy with Dr. Jena Gauss. See separate instructions.  2. Please chew food thoroughly, consume soft food. Avoid meat.

## 2015-04-02 NOTE — Progress Notes (Signed)
CC'ED TO PCP 

## 2015-04-16 ENCOUNTER — Encounter (HOSPITAL_COMMUNITY)
Admission: RE | Disposition: A | Discharge status: Home / Self Care | Payer: Self-pay | Source: Ambulatory Visit | Attending: Internal Medicine

## 2015-04-16 ENCOUNTER — Ambulatory Visit (HOSPITAL_COMMUNITY)
Admission: RE | Admit: 2015-04-16 | Discharge: 2015-04-16 | Disposition: A | Discharge status: Home / Self Care | Payer: BLUE CROSS/BLUE SHIELD | Source: Ambulatory Visit | Attending: Internal Medicine | Admitting: Internal Medicine

## 2015-04-16 ENCOUNTER — Encounter (HOSPITAL_COMMUNITY): Payer: Self-pay | Admitting: *Deleted

## 2015-04-16 DIAGNOSIS — I2510 Atherosclerotic heart disease of native coronary artery without angina pectoris: Secondary | ICD-10-CM | POA: Insufficient documentation

## 2015-04-16 DIAGNOSIS — K449 Diaphragmatic hernia without obstruction or gangrene: Secondary | ICD-10-CM | POA: Diagnosis not present

## 2015-04-16 DIAGNOSIS — Z7902 Long term (current) use of antithrombotics/antiplatelets: Secondary | ICD-10-CM | POA: Diagnosis not present

## 2015-04-16 DIAGNOSIS — Z794 Long term (current) use of insulin: Secondary | ICD-10-CM | POA: Diagnosis not present

## 2015-04-16 DIAGNOSIS — Z79899 Other long term (current) drug therapy: Secondary | ICD-10-CM | POA: Insufficient documentation

## 2015-04-16 DIAGNOSIS — K3189 Other diseases of stomach and duodenum: Secondary | ICD-10-CM | POA: Diagnosis not present

## 2015-04-16 DIAGNOSIS — K222 Esophageal obstruction: Secondary | ICD-10-CM | POA: Insufficient documentation

## 2015-04-16 DIAGNOSIS — Z87891 Personal history of nicotine dependence: Secondary | ICD-10-CM | POA: Insufficient documentation

## 2015-04-16 DIAGNOSIS — Z955 Presence of coronary angioplasty implant and graft: Secondary | ICD-10-CM | POA: Diagnosis not present

## 2015-04-16 DIAGNOSIS — K229 Disease of esophagus, unspecified: Secondary | ICD-10-CM | POA: Insufficient documentation

## 2015-04-16 DIAGNOSIS — Q394 Esophageal web: Secondary | ICD-10-CM | POA: Diagnosis not present

## 2015-04-16 DIAGNOSIS — B9681 Helicobacter pylori [H. pylori] as the cause of diseases classified elsewhere: Secondary | ICD-10-CM | POA: Diagnosis not present

## 2015-04-16 DIAGNOSIS — R1310 Dysphagia, unspecified: Secondary | ICD-10-CM | POA: Diagnosis present

## 2015-04-16 DIAGNOSIS — K2289 Other specified disease of esophagus: Secondary | ICD-10-CM | POA: Insufficient documentation

## 2015-04-16 DIAGNOSIS — K2950 Unspecified chronic gastritis without bleeding: Secondary | ICD-10-CM | POA: Diagnosis not present

## 2015-04-16 DIAGNOSIS — Z7982 Long term (current) use of aspirin: Secondary | ICD-10-CM | POA: Diagnosis not present

## 2015-04-16 DIAGNOSIS — E785 Hyperlipidemia, unspecified: Secondary | ICD-10-CM | POA: Insufficient documentation

## 2015-04-16 DIAGNOSIS — Z951 Presence of aortocoronary bypass graft: Secondary | ICD-10-CM | POA: Insufficient documentation

## 2015-04-16 DIAGNOSIS — E119 Type 2 diabetes mellitus without complications: Secondary | ICD-10-CM | POA: Insufficient documentation

## 2015-04-16 DIAGNOSIS — G4730 Sleep apnea, unspecified: Secondary | ICD-10-CM | POA: Diagnosis not present

## 2015-04-16 DIAGNOSIS — K219 Gastro-esophageal reflux disease without esophagitis: Secondary | ICD-10-CM

## 2015-04-16 DIAGNOSIS — I10 Essential (primary) hypertension: Secondary | ICD-10-CM | POA: Diagnosis not present

## 2015-04-16 HISTORY — PX: ESOPHAGEAL DILATION: SHX303

## 2015-04-16 HISTORY — PX: ESOPHAGOGASTRODUODENOSCOPY: SHX5428

## 2015-04-16 LAB — GLUCOSE, CAPILLARY: GLUCOSE-CAPILLARY: 209 mg/dL — AB (ref 65–99)

## 2015-04-16 SURGERY — EGD (ESOPHAGOGASTRODUODENOSCOPY)
Anesthesia: Moderate Sedation

## 2015-04-16 MED ORDER — SODIUM CHLORIDE 0.9 % IV SOLN
INTRAVENOUS | Status: DC
  Administered 2015-04-16: 08:00:00 via INTRAVENOUS

## 2015-04-16 MED ORDER — MIDAZOLAM HCL 5 MG/5ML IJ SOLN
INTRAMUSCULAR | Status: AC
  Filled 2015-04-16: qty 10

## 2015-04-16 MED ORDER — LIDOCAINE VISCOUS 2 % MT SOLN
OROMUCOSAL | Status: AC
  Filled 2015-04-16: qty 15

## 2015-04-16 MED ORDER — STERILE WATER FOR IRRIGATION IR SOLN
Status: DC | PRN
  Administered 2015-04-16: 08:00:00

## 2015-04-16 MED ORDER — ONDANSETRON HCL 4 MG/2ML IJ SOLN
INTRAMUSCULAR | Status: AC
  Filled 2015-04-16: qty 2

## 2015-04-16 MED ORDER — MEPERIDINE HCL 100 MG/ML IJ SOLN
INTRAMUSCULAR | Status: DC | PRN
Start: 2015-04-16 — End: 2015-04-16
  Administered 2015-04-16 (×3): 25 mg via INTRAVENOUS

## 2015-04-16 MED ORDER — MIDAZOLAM HCL 5 MG/5ML IJ SOLN
INTRAMUSCULAR | Status: DC | PRN
  Administered 2015-04-16: 1 mg via INTRAVENOUS
  Administered 2015-04-16: 2 mg via INTRAVENOUS
  Administered 2015-04-16 (×2): 1 mg via INTRAVENOUS

## 2015-04-16 MED ORDER — ONDANSETRON HCL 4 MG/2ML IJ SOLN
INTRAMUSCULAR | Status: DC | PRN
  Administered 2015-04-16: 4 mg via INTRAVENOUS

## 2015-04-16 MED ORDER — LIDOCAINE VISCOUS 2 % MT SOLN
OROMUCOSAL | Status: DC | PRN
  Administered 2015-04-16: 3 mL via OROMUCOSAL

## 2015-04-16 MED ORDER — MEPERIDINE HCL 100 MG/ML IJ SOLN
INTRAMUSCULAR | Status: AC
  Filled 2015-04-16: qty 2

## 2015-04-16 NOTE — Discharge Instructions (Signed)
EGD Discharge instructions Please read the instructions outlined below and refer to this sheet in the next few weeks. These discharge instructions provide you with general information on caring for yourself after you leave the hospital. Your doctor may also give you specific instructions. While your treatment has been planned according to the most current medical practices available, unavoidable complications occasionally occur. If you have any problems or questions after discharge, please call your doctor. ACTIVITY  You may resume your regular activity but move at a slower pace for the next 24 hours.   Take frequent rest periods for the next 24 hours.   Walking will help expel (get rid of) the air and reduce the bloated feeling in your abdomen.   No driving for 24 hours (because of the anesthesia (medicine) used during the test).   You may shower.   Do not sign any important legal documents or operate any machinery for 24 hours (because of the anesthesia used during the test).  NUTRITION  Drink plenty of fluids.   You may resume your normal diet.   Begin with a light meal and progress to your normal diet.   Avoid alcoholic beverages for 24 hours or as instructed by your caregiver.  MEDICATIONS  You may resume your normal medications unless your caregiver tells you otherwise.  WHAT YOU CAN EXPECT TODAY  You may experience abdominal discomfort such as a feeling of fullness or gas pains.  FOLLOW-UP  Your doctor will discuss the results of your test with you.  SEEK IMMEDIATE MEDICAL ATTENTION IF ANY OF THE FOLLOWING OCCUR:  Excessive nausea (feeling sick to your stomach) and/or vomiting.   Severe abdominal pain and distention (swelling).   Trouble swallowing.   Temperature over 101 F (37.8 C).   Rectal bleeding or vomiting of blood.     Increase Pepcid to 40 mg once daily. Skipping a dose every 3-4 days may actually help this medication worked better over the long  run  GERD information provided.  Further recommendations to follow pending review of pathology report  Office visit with [place] in one year  Gastroesophageal Reflux Disease, Adult Gastroesophageal reflux disease (GERD) happens when acid from your stomach flows up into the esophagus. When acid comes in contact with the esophagus, the acid causes soreness (inflammation) in the esophagus. Over time, GERD may create small holes (ulcers) in the lining of the esophagus. CAUSES   Increased body weight. This puts pressure on the stomach, making acid rise from the stomach into the esophagus.  Smoking. This increases acid production in the stomach.  Drinking alcohol. This causes decreased pressure in the lower esophageal sphincter (valve or ring of muscle between the esophagus and stomach), allowing acid from the stomach into the esophagus.  Late evening meals and a full stomach. This increases pressure and acid production in the stomach.  A malformed lower esophageal sphincter. Sometimes, no cause is found. SYMPTOMS   Burning pain in the lower part of the mid-chest behind the breastbone and in the mid-stomach area. This may occur twice a week or more often.  Trouble swallowing.  Sore throat.  Dry cough.  Asthma-like symptoms including chest tightness, shortness of breath, or wheezing. DIAGNOSIS  Your caregiver may be able to diagnose GERD based on your symptoms. In some cases, X-rays and other tests may be done to check for complications or to check the condition of your stomach and esophagus. TREATMENT  Your caregiver may recommend over-the-counter or prescription medicines to help decrease acid  production. Ask your caregiver before starting or adding any new medicines.  HOME CARE INSTRUCTIONS   Change the factors that you can control. Ask your caregiver for guidance concerning weight loss, quitting smoking, and alcohol consumption.  Avoid foods and drinks that make your symptoms worse,  such as:  Caffeine or alcoholic drinks.  Chocolate.  Peppermint or mint flavorings.  Garlic and onions.  Spicy foods.  Citrus fruits, such as oranges, lemons, or limes.  Tomato-based foods such as sauce, chili, salsa, and pizza.  Fried and fatty foods.  Avoid lying down for the 3 hours prior to your bedtime or prior to taking a nap.  Eat small, frequent meals instead of large meals.  Wear loose-fitting clothing. Do not wear anything tight around your waist that causes pressure on your stomach.  Raise the head of your bed 6 to 8 inches with wood blocks to help you sleep. Extra pillows will not help.  Only take over-the-counter or prescription medicines for pain, discomfort, or fever as directed by your caregiver.  Do not take aspirin, ibuprofen, or other nonsteroidal anti-inflammatory drugs (NSAIDs). SEEK IMMEDIATE MEDICAL CARE IF:   You have pain in your arms, neck, jaw, teeth, or back.  Your pain increases or changes in intensity or duration.  You develop nausea, vomiting, or sweating (diaphoresis).  You develop shortness of breath, or you faint.  Your vomit is green, yellow, black, or looks like coffee grounds or blood.  Your stool is red, bloody, or black. These symptoms could be signs of other problems, such as heart disease, gastric bleeding, or esophageal bleeding. MAKE SURE YOU:   Understand these instructions.  Will watch your condition.  Will get help right away if you are not doing well or get worse. Document Released: 05/17/2005 Document Revised: 10/30/2011 Document Reviewed: 02/24/2011 [facility] Patient Information 2015 [place], [place]. This information is not intended to replace advice given to you by your health care provider. Make sure you discuss any questions you have with your health care provider.

## 2015-04-16 NOTE — Op Note (Signed)
[facility] [address] [place], [address]   ENDOSCOPY PROCEDURE REPORT  PATIENT: [name] [name]  MR#: [id number] BIRTHDATE: Oct 15, 1955 , 59  yrs. old GENDER: male ENDOSCOPIST: R.  Roetta Sessions, MD FACP FACG REFERRED BY:  Joette Catching, M.D.  Runell Gess, M.D. PROCEDURE DATE:  Apr 18, 2015 PROCEDURE:  EGD w/ biopsy and Maloney dilation of esophagus INDICATIONS:  Recurrent esophageal dysphagia; known Schatzki's ring/stricture. MEDICATIONS: Versed 5 mg IV and Demerol 75 mg IV in divided doses. Zofran 4 mg IV.  Xylocaine gel orally. ASA CLASS:      Class III  CONSENT: The risks, benefits, limitations, alternatives and imponderables have been discussed.  The potential for biopsy, esophogeal dilation, etc. have also been reviewed.  Questions have been answered.  All parties agreeable.  Please see the history and physical in the medical record for more information.  DESCRIPTION OF PROCEDURE: After the risks benefits and alternatives of the procedure were thoroughly explained, informed consent was obtained.  The EG-[id number] ([id number]) endoscope was introduced through the mouth and advanced to the second portion of the duodenum , limited by Without limitations. The instrument was slowly withdrawn as the mucosa was fully examined. Estimated blood loss is zero unless otherwise noted in this procedure report.    Patient has 2 distal tandem esophageal rings.  No stricture.  No esophagitis.  A couple of irregular 5 mm "tongues" of salmon-colored epithelium coming up above the GE junction (at the second ring).  No nodularity.  Stomach empty.  Small hiatal hernia. Mild fish scale or snakeskin appearance to gastric mucosa.  No ulcer or infiltrating process.  Patent pylorus.  Normal-appearing first and second portion of the duodenum.  Scope was withdrawn, a 56 [place] Maloney dilator was passed to full insertion with mild to moderate resistance.  A look back  revealed both rings had been nicely ruptured with minimal bleeding and without apparent complication.  Subsequently, the abnormal distal esophagus and gastric mucosa were biopsied for the pathologist. Retroflexed views revealed as previously described.     The scope was then withdrawn from the patient and the procedure completed.  COMPLICATIONS: There were no immediate complications. EBL 5 mL ENDOSCOPIC IMPRESSION: Distal tandem esophageal rings?"status post Maloney dilation. Abnormal distal esophagus. Status post esophageal biopsy. Hiatal hernia. Abnormal gastric mucosa of uncertain significance. Status post  gastric biopsy.  Patient really needs to be on a proton pump inhibitor. However, he is on Plavix.  H2 blockers are relatively weak acid blockers.  RECOMMENDATIONS: Increase Pepcid to 40 mg daily. Skipping a dose every 3 or 4 days may increase the effectiveness of his medication.  Further recommendations to follow pending review of pathology report. Office visit with [place] in one year.  REPEAT EXAM:  eSigned:  R. Roetta Sessions, MD [facility] 04-18-2015 9:08 AM    CC:  CPT CODES: ICD CODES:  The ICD and CPT codes recommended by this software are interpretations from the data that the clinical staff has captured with the software.  The verification of the translation of this report to the ICD and CPT codes and modifiers is the sole responsibility of the health care institution and practicing physician where this report was generated.  [employer] will not be held responsible for the validity of the ICD and CPT codes included on this report.  AMA assumes no liability for data contained or not contained herein. CPT is a Publishing rights manager of the [employer].  PATIENT NAME:  [name],  [name] [name] MR#: [id number]

## 2015-04-16 NOTE — H&P (View-Only) (Signed)
Primary Care Physician:  Josue Hector, MD  Primary Gastroenterologist:  Roetta Sessions, MD   Chief Complaint  Patient presents with  . OTHER    ? EGD    HPI:  [name] [name] is a 59 y.o. male here to schedule EGD. Patient recently required EGD with foreign body removal (date) back in June 2016. Procedure was performed by Dr. Charlott Rakes. Patient's primary gastroenterologist is Dr. Jena Gauss. Patient has a history of Schatzki ring requiring dilation in the past. This is actually a second time that he had a food impaction requiring emergent EGD over the past 6 or 7 years.  Recent EGD with foreign body removal, noted to have narrowed distal esophagus with short stricture. Stricture was edematous and ulcerated consistent with trauma from the food impaction. Partial dilation with passage of the scope. Advised to come back in 1-2 weeks for EGD with dilation.  Patient previously was on PPI therapy, I believe omeprazole. Advised by cardiology several years ago to stop due to his risk of decreased efficacy of the Plavix. Patient has a history of in-stent restenosis being treated medically. Prior remote coronary artery bypass graft as well.  Heartburn only with certain foods if eat late. Takes Pepcid once daily. Chews food thoroughly. Usually will note the first bite tenderness to get stuck and then eventually will go down and he has no problems with the rest the meal. Worse with bread and meats. Denies odynophagia except for with impacted food bolus. Episode that led to recent emergency department visit lasted for about 6 hours prior to his scope. Symptoms have been intermittent for several months. One episode since June. Denies abdominal pain. Bowel movements are regular. No blood in the stool or melena.  Current Outpatient Prescriptions  Medication Sig Dispense Refill  . aspirin EC 81 MG tablet Take 81 mg by mouth 2 (two) times daily.     Marland Kitchen atorvastatin (LIPITOR) 10 MG tablet Take 10 mg by  mouth daily.    . clopidogrel (PLAVIX) 75 MG tablet Take 75 mg by mouth daily.    Marland Kitchen escitalopram (LEXAPRO) 20 MG tablet Take 20 mg by mouth at bedtime.     . famotidine (PEPCID) 20 MG tablet Take 20 mg by mouth daily.     . fish oil-omega-3 fatty acids 1000 MG capsule Take 1 g by mouth daily.     . insulin glargine (LANTUS) 100 UNIT/ML injection Inject 50 Units into the skin at bedtime.     . INVOKANA 300 MG TABS tablet Take 300 mg by mouth daily before breakfast.   4  . JANUMET 50-1000 MG per tablet Take 1 tablet by mouth 2 (two) times daily with a meal.     . losartan (COZAAR) 100 MG tablet Take 100 mg by mouth every evening.     . metoprolol succinate (TOPROL-XL) 25 MG 24 hr tablet Take 25 mg by mouth at bedtime.     . Multiple Vitamin (MULITIVITAMIN WITH MINERALS) TABS Take 1 tablet by mouth daily.    . rosuvastatin (CRESTOR) 5 MG tablet Take 5 mg by mouth at bedtime.      No current facility-administered medications for this visit.    Allergies as of 04/01/2015 - Review Complete 04/01/2015  Allergen Reaction Noted  . Hydrocodone-acetaminophen Swelling   . Pravastatin sodium Other (See Comments) 01/30/2015  . Hydrochlorothiazide Itching     Past Medical History  Diagnosis Date  . Coronary artery disease     post bypass grafting in 1996-.SVG  was stented  to his ramus branch in 2006     . Hypertension   . Hyperlipidemia   . Diabetes mellitus without complication     noninsulin -requiring diabetes   . Sleep apnea     pt has a cpap machine  . Dyspnea     MYOVIEW STRESS TEST--PERFORMED 04/30/2009--WHICH SHOWED  NEW LATERAL ISCHEMIA.   Marland Kitchen Chest pain         . Right bundle branch block     Past Surgical History  Procedure Laterality Date  . Cardiovascular stress test  SEPT 10 2010    NEW LATERAL ISCHEMIA  . Cardiovascular stress test  November 01 2009    SHOWED RESOULTION OF HIS ISCHEMIA ABNORMALITY. PT WAS ASYMPTOMATIC  AFTER THAT  . Cardiovascular stress test  December 08 2011     MYOVIEW SHOWED NEW ANTEROLATERAL ISCHEMIA (LED TO A CATH)  . Cardiac catheterization  01 /21/2011     REVEALING 75 % IN - STENT RESTENOSIS WITHIN  THE STENTED SEGMENT WHICH I RESTENTED WITH A DRUG ELUTING STENT   . Cardiac catheterization  04/ 24 /2013    THAT SHOWED 95 % IN STENT RESTENOSIS WITHIN THE PREVIOUSLY RESTENTED SEGMENT . iI ELECTED TO TREAT HIM MEDICALLY/ PT ASYMPTOMATIC SINCE  . Sleep  study report  05/03/2007    The patient has undergone a diagnostic polysomnogram which confirmed the diagnosis of sleep disordered breathing with an AHI of 5.4/hr and 24.91/hr during REM sleep  . Sleep study  07/ 06/2007    The (AHI) formerly known as RDI , during the total sleep time  6 h 17 min  was 5.41/ hr and during Rem sleep at 24.91/hr .This places the patient into the mild sleep apnea category . The average oxygen saturation range   . Sleep study  07 11 2008    (cont-) range during REM was 91 % The lowest oxygen saturation during NON-REM and REM sleep was 86% and 87 % respectively . The patient was obsered snoring loudly  . Adult echocardiography  11/08/2004    The left ventricle is normal size .There is normal left ventricular wall thickness .EJECTION FRACTION =>55%.The LA is mildly dilated .The main PA is imaged to the bicfucation into right and left branches with normal size noted.Based on an assumed RA pressure orf 10 and a end diastolic PR gradient of 1.2 m/sec the PA end diastolic pressure is estimated to be ~16 which is top normal  . Carotid doppler  11 /23/2013    Summay -Bilateral Bulb ICA's demonstrated a mild amount of fibrous plaquewith no evidence of significant diameter  reduction ,tortuosity or any other vascular abnormality. This is midly abnormal carotid duple doppler eval. When compared to the previous study dated 08/12/2007,there are no significant changes noted.Noted it is recommend that this test be repeated when clinically indicated aque   . Left heart catheterization with  coronary angiogram N/A 12/13/2011    Procedure: LEFT HEART CATHETERIZATION WITH CORONARY ANGIOGRAM;  Surgeon: Runell Gess, MD;  Location: [facility] CATH LAB;  Service: Cardiovascular;  Laterality: N/A;  . Esophagogastroduodenoscopy N/A 01/30/2015    Dr. Charlott Rakes: food impaction-foreign body removal, distal esophageal stricture, ulcerated esophagus due to food impaction.   . Esophagogastroduodenoscopy  11/2007    Dr. Jena Gauss: Schatzki ring s/p dilation, small hh  . Colonoscopy  11/2007    Dr. Jena Gauss: normal. repeat 11/2017.  Marland Kitchen Esophagogastroduodenoscopy  10/2007    Dr. Carman Ching: food impaction, friable esophageal ring  Family History  Problem Relation Age of Onset  . Colon cancer Neg Hx   . Diabetes Mother   . Diabetes Brother   . Diabetes Sister     Social History   Social History  . Marital Status: Married    Spouse Name: N/A  . Number of Children: 0  . Years of Education: N/A   Occupational History  . maintenance on textile machines    Social History Main Topics  . Smoking status: Former Smoker    Quit date: 02/19/1995  . Smokeless tobacco: Never Used     Comment: Quit x 20 years  . Alcohol Use: No  . Drug Use: No  . Sexual Activity: Not on file   Other Topics Concern  . Not on file   Social History Narrative      ROS:  General: Negative for anorexia, weight loss, fever, chills, fatigue, weakness. Eyes: Negative for vision changes.  ENT: Negative for hoarseness, difficulty swallowing , nasal congestion. CV: Negative for chest pain, angina, palpitations, dyspnea on exertion, peripheral edema.  Respiratory: Negative for dyspnea at rest, dyspnea on exertion, cough, sputum, wheezing.  GI: See history of present illness. GU:  Negative for dysuria, hematuria, urinary incontinence, urinary frequency, nocturnal urination.  MS: Negative for joint pain, low back pain.  Derm: Negative for rash or itching.  Neuro: Negative for weakness, abnormal sensation, seizure,  frequent headaches, memory loss, confusion.  Psych: Negative for anxiety, depression, suicidal ideation, hallucinations.  Endo: Negative for unusual weight change.  Heme: Negative for bruising or bleeding. Allergy: Negative for rash or hives.    Physical Examination:  BP 131/56 mmHg  Pulse 59  Temp(Src) 97.6 F (36.4 C) (Oral)  Ht 5\' 10"  (1.778 m)  Wt 225 lb 3.2 oz (102.15 kg)  BMI 32.31 kg/m2   General: Well-nourished, well-developed in no acute distress.  Head: Normocephalic, atraumatic.   Eyes: Conjunctiva pink, no icterus. Mouth: Oropharyngeal mucosa moist and pink , no lesions erythema or exudate. Neck: Supple without thyromegaly, masses, or lymphadenopathy.  Lungs: Clear to auscultation bilaterally.  Heart: Regular rate and rhythm, no murmurs rubs or gallops.  Abdomen: Bowel sounds are normal, nontender, nondistended, no hepatosplenomegaly or masses, no abdominal bruits or    hernia , no rebound or guarding.   Rectal: not performed Extremities: No lower extremity edema. No clubbing or deformities.  Neuro: Alert and oriented x 4 , grossly normal neurologically.  Skin: Warm and dry, no rash or jaundice.   Psych: Alert and cooperative, normal mood and affect.  Labs: Lab Results  Component Value Date         HGB 16.3 01/30/2015   HCT 48.0 01/30/2015                 Imaging Studies: No results found.

## 2015-04-16 NOTE — Interval H&P Note (Signed)
History and Physical Interval Note:  04/16/2015 8:19 AM  [name] [name]  has presented today for surgery, with the diagnosis of GERD, esophageal stricture  The various methods of treatment have been discussed with the patient and family. After consideration of risks, benefits and other options for treatment, the patient has consented to  Procedure(s) with comments: ESOPHAGOGASTRODUODENOSCOPY (EGD) (N/A) - 815 ESOPHAGEAL DILATION (N/A) as a surgical intervention .  The patient's history has been reviewed, patient examined, no change in status, stable for surgery.  I have reviewed the patient's chart and labs.  Questions were answered to the patient's satisfaction.     [name] [name]  No change. Patient is remaining on Plavix per plan. EGD today with dilation as feasible/appropriate.  The risks, benefits, limitations, alternatives and imponderables have been reviewed with the patient. Potential for esophageal dilation, biopsy, etc. have also been reviewed.  Questions have been answered. All parties agreeable.

## 2015-04-20 ENCOUNTER — Encounter: Payer: Self-pay | Admitting: Internal Medicine

## 2015-04-20 ENCOUNTER — Telehealth: Payer: Self-pay

## 2015-04-20 NOTE — Telephone Encounter (Signed)
Per RMR- Send letter to patient.  Send copy of letter with path to referring provider and PCP.   Patient needs Pylera X 10 days along with pantoprazole 40 mg twice daily for the same period of time.   Office visit 6 months if not already scheduled

## 2015-04-20 NOTE — Telephone Encounter (Signed)
Letter mailed to the pt. 

## 2015-04-21 ENCOUNTER — Encounter (HOSPITAL_COMMUNITY): Payer: Self-pay | Admitting: Internal Medicine

## 2015-04-21 ENCOUNTER — Encounter: Payer: Self-pay | Admitting: Internal Medicine

## 2015-04-21 NOTE — Telephone Encounter (Signed)
APPT MADE AND LETTER SENT  °

## 2015-04-22 MED ORDER — PANTOPRAZOLE SODIUM 40 MG PO TBEC: 40.0000 mg | DELAYED_RELEASE_TABLET | Freq: Two times a day (BID) | ORAL | Status: DC

## 2015-04-22 MED ORDER — BIS SUBCIT-METRONID-TETRACYC 140-125-125 MG PO CAPS: 3.0000 | ORAL_CAPSULE | Freq: Three times a day (TID) | ORAL | Status: DC

## 2015-04-22 NOTE — Telephone Encounter (Signed)
Pt is aware. I answered all his questions. rx sent to the pharmacy.

## 2015-04-22 NOTE — Telephone Encounter (Signed)
Tried to call pt- LMOM 

## 2015-06-22 ENCOUNTER — Telehealth: Payer: Self-pay | Admitting: General Practice

## 2015-06-22 NOTE — Telephone Encounter (Signed)
Patient stated he would call and make payment arrangements

## 2015-06-22 NOTE — Telephone Encounter (Signed)
The deductible was satisfied as of 04/28/2015 which makes our claim subject to the member's deductible.  So the patient has not met his deductible.

## 2015-06-22 NOTE — Telephone Encounter (Signed)
Per Cat M. with [employer]([phone])we needed to file a corrected claim in order to receive payment

## 2015-06-22 NOTE — Telephone Encounter (Signed)
Patient states his deductible was $500

## 2015-06-22 NOTE — Telephone Encounter (Signed)
She states the patient has a $1400 deductible and she is going to research to see if it has been satisfied

## 2015-06-22 NOTE — Telephone Encounter (Signed)
Mr. Duell called in stating that he received a bill from [place] in the amount of $554.60.  He called his insurance company and they told him that the claim was filed wrong and we needed to resubmit and they would pay the claim.  He went on to say that he has met his deductible and he should not have a balance.

## 2015-08-27 ENCOUNTER — Encounter: Payer: Self-pay | Admitting: Cardiovascular Disease

## 2015-08-27 ENCOUNTER — Ambulatory Visit (INDEPENDENT_AMBULATORY_CARE_PROVIDER_SITE_OTHER): Payer: BLUE CROSS/BLUE SHIELD | Admitting: Cardiovascular Disease

## 2015-08-27 VITALS — BP 140/82 | HR 60 | Ht 71.0 in | Wt 227.8 lb

## 2015-08-27 DIAGNOSIS — E785 Hyperlipidemia, unspecified: Secondary | ICD-10-CM | POA: Diagnosis not present

## 2015-08-27 DIAGNOSIS — I2510 Atherosclerotic heart disease of native coronary artery without angina pectoris: Secondary | ICD-10-CM

## 2015-08-27 DIAGNOSIS — I10 Essential (primary) hypertension: Secondary | ICD-10-CM | POA: Diagnosis not present

## 2015-08-27 DIAGNOSIS — I2583 Coronary atherosclerosis due to lipid rich plaque: Secondary | ICD-10-CM

## 2015-08-27 NOTE — Patient Instructions (Signed)

## 2015-08-27 NOTE — Progress Notes (Signed)
08/27/2015 Woodward KuJohn T Carte   07/19/1956  [id number]  Primary Physician Josue HectorNYLAND,LEONARD ROBERT, MD Primary Cardiologist: Runell GessJonathan J. Belicia Difatta MD Roseanne RenoFACP,FACC,FAHA, FSCAI   HPI:  The patient returns today for followup. He is a 60 year old, moderately overweight, married Caucasian male, father of 3, grandfather to 6 grandchildren who I last saw 12months ago. He has a history of CAD status post bypass grafting in 1996. I stented the SVG to his ramus branch in 2006. His other problems include hypertension, hyperlipidemia and noninsulin-requiring diabetes as well as obstructive sleep apnea on CPAP. He had a Myoview stress test performed April 30, 2009, which showed new lateral ischemia. He was complaining of dyspnea on exertion at that time. I catheterized him September 10, 2009, revealing 75% in-stent restenosis within the stented segment which I restented with a drug-eluting stent. Myoview performed November 01, 2009, showed resolution of his ischemic abnormality. He was asymptomatic after that. A followup Myoview did show new anterolateral ischemia performed December 08, 2011, which led to a cath on December 13, 2011, that showed 95% in-stent restenosis within the previously restented segment. I elected to treat him medically. He has been asymptomatic since.Dr. Lysbeth GalasNyland follows his lipid profile   Current Outpatient Prescriptions  Medication Sig Dispense Refill  . aspirin EC 81 MG tablet Take 81 mg by mouth 2 (two) times daily.     Marland Kitchen. atorvastatin (LIPITOR) 10 MG tablet Take 10 mg by mouth daily.    . clopidogrel (PLAVIX) 75 MG tablet Take 75 mg by mouth daily.    Marland Kitchen. escitalopram (LEXAPRO) 20 MG tablet Take 20 mg by mouth at bedtime.     . famotidine (PEPCID) 20 MG tablet Take 20 mg by mouth daily.     . fish oil-omega-3 fatty acids 1000 MG capsule Take 1 g by mouth daily.     . insulin glargine (LANTUS) 100 UNIT/ML injection Inject 50 Units into the skin at bedtime.     . INVOKANA 300 MG TABS tablet Take 300 mg by  mouth daily before breakfast.   4  . JANUMET 50-1000 MG per tablet Take 1 tablet by mouth 2 (two) times daily with a meal.     . losartan (COZAAR) 100 MG tablet Take 100 mg by mouth every evening.     . metoprolol succinate (TOPROL-XL) 25 MG 24 hr tablet Take 25 mg by mouth at bedtime.     . Multiple Vitamin (MULITIVITAMIN WITH MINERALS) TABS Take 1 tablet by mouth daily.     No current facility-administered medications for this visit.    Allergies  Allergen Reactions  . Hydrocodone-Acetaminophen Swelling    Swelling of hands and feet followed by skin peeling off of hands and feet  . Pravastatin Sodium Other (See Comments)    Weakness and myalgias  . Hydrochlorothiazide Itching    Social History   Social History  . Marital Status: Married    Spouse Name: N/A  . Number of Children: 0  . Years of Education: N/A   Occupational History  . maintenance on textile machines    Social History Main Topics  . Smoking status: Former Smoker    Quit date: 02/19/1995  . Smokeless tobacco: Never Used     Comment: Quit x 20 years  . Alcohol Use: No  . Drug Use: No  . Sexual Activity: Not on file   Other Topics Concern  . Not on file   Social History Narrative     Review of Systems: General: negative  for chills, fever, night sweats or weight changes.  Cardiovascular: negative for chest pain, dyspnea on exertion, edema, orthopnea, palpitations, paroxysmal nocturnal dyspnea or shortness of breath Dermatological: negative for rash Respiratory: negative for cough or wheezing Urologic: negative for hematuria Abdominal: negative for nausea, vomiting, diarrhea, bright red blood per rectum, melena, or hematemesis Neurologic: negative for visual changes, syncope, or dizziness All other systems reviewed and are otherwise negative except as noted above.    Blood pressure 140/82, pulse 60, height 5\' 11"  (1.803 m), weight 227 lb 12.8 oz (103.329 kg).  General appearance: alert and no  distress Neck: no adenopathy, no carotid bruit, no JVD, supple, symmetrical, trachea midline and thyroid not enlarged, symmetric, no tenderness/mass/nodules Lungs: clear to auscultation bilaterally Heart: regular rate and rhythm, S1, S2 normal, no murmur, click, rub or gallop Extremities: extremities normal, atraumatic, no cyanosis or edema  EKG sinus rhythm at 60 with right bundle branch block. I personally reviewed this EKG  ASSESSMENT AND PLAN:   HTN (hypertension) History of hypertension blood pressure measured at 140/82. He is on losartan and metoprolol. Continue current meds at current dosing  Dyslipidemia History of hyperlipidemia on statin therapy followed by his PCP  CAD, CABG '96. SVG-RI PCI '06, 1/11. Cath 4/13 with ISR- medical Rx only History of CAD status post coronary artery bypass grafting in 1996. Stented his SVG to his ramus branch in 2006. A Myoview stress test performed 04/30/2009 showed new lateral ischemia. Because of dyspnea on exertion that time I catheterized him 09/10/09 revealed a 75% "in-stent restenosis" within the stented segment which I restented. A follow-up Myoview performed 12/08/11 but repeat  12/13/11 and again showed 95% in-stent restenosis within the previously stented segment. I elected to treat him medically at that time. He denies chest pain or shortness of breath.      Runell Gess MD FACP,FACC,FAHA, [facility] 08/27/2015 4:44 PM

## 2015-08-27 NOTE — Assessment & Plan Note (Signed)
History of hypertension blood pressure measured at 140/82. He is on losartan and metoprolol. Continue current meds at current dosing

## 2015-08-27 NOTE — Assessment & Plan Note (Signed)
History of CAD status post coronary artery bypass grafting in 1996. Stented his SVG to his ramus branch in 2006. A Myoview stress test performed 04/30/2009 showed new lateral ischemia. Because of dyspnea on exertion that time I catheterized him 09/10/09 revealed a 75% "in-stent restenosis" within the stented segment which I restented. A follow-up Myoview performed 12/08/11 but repeat  12/13/11 and again showed 95% in-stent restenosis within the previously stented segment. I elected to treat him medically at that time. He denies chest pain or shortness of breath.

## 2015-08-27 NOTE — Assessment & Plan Note (Signed)
History of hyperlipidemia on statin therapy followed by his PCP 

## 2015-10-19 ENCOUNTER — Ambulatory Visit (INDEPENDENT_AMBULATORY_CARE_PROVIDER_SITE_OTHER): Payer: BLUE CROSS/BLUE SHIELD | Admitting: Gastroenterology

## 2015-10-19 ENCOUNTER — Encounter: Payer: Self-pay | Admitting: Gastroenterology

## 2015-10-19 VITALS — BP 137/63 | HR 52 | Temp 97.3°F | Ht 70.0 in | Wt 224.8 lb

## 2015-10-19 DIAGNOSIS — Q394 Esophageal web: Secondary | ICD-10-CM | POA: Diagnosis not present

## 2015-10-19 DIAGNOSIS — B9681 Helicobacter pylori [H. pylori] as the cause of diseases classified elsewhere: Secondary | ICD-10-CM | POA: Diagnosis not present

## 2015-10-19 DIAGNOSIS — K222 Esophageal obstruction: Secondary | ICD-10-CM

## 2015-10-19 DIAGNOSIS — K219 Gastro-esophageal reflux disease without esophagitis: Secondary | ICD-10-CM

## 2015-10-19 DIAGNOSIS — K2970 Gastritis, unspecified, without bleeding: Secondary | ICD-10-CM

## 2015-10-19 NOTE — Progress Notes (Signed)
CC'D TO PCP °

## 2015-10-19 NOTE — Patient Instructions (Signed)
1. Stop Pepcid for 2 weeks, then collect stool specimen for H pylori and return to the lab. You may resume Pepcid once you collect your specimen. Also, you cannot take antibiotics within 2 weeks of collecting stool specimen as it may interfere with the results. 2. Return to the office in one year or sooner if needed.

## 2015-10-19 NOTE — Assessment & Plan Note (Signed)
H pylori stool antigen to check for eradication.

## 2015-10-19 NOTE — Progress Notes (Signed)
Primary Care Physician: Josue Hector, MD  Primary Gastroenterologist:  Roetta Sessions, MD   Chief Complaint  Patient presents with  . Follow-up    HPI: [name] [name] is a 60 y.o. male here for follow-up. He was seen back in August to schedule second EGD, had foreign body removal by Dr. Charlott Rakes back in June 2016 emergently. Partial dilation with passage of the scope was done, the distal esophagus was narrowed and there was a short stricture with edema and ulceration. Repeat EGD on 04/16/2015 for dilation showed 2 distal tandem esophageal rings, no stricture, no esophagitis. Esophageal biopsies consistent with GERD. He had chronic active gastritis with H. pylori as well. Treated with 10 day course of Pylera. Previously patient was on omeprazole but this was taken off due to the risk of decreased efficacy of Plavix. He has a history of in-stent restenosis. He had been maintained on Pepcid for typical reflux symptoms. After his last EGD, increase Pepcid to 20 mg BID.  Patient states he is doing well. No issue swallowing. Rare heartburn. Denies abdominal pain. Bowel movements are regular. No blood in the stool or melena.  Current Outpatient Prescriptions  Medication Sig Dispense Refill  . aspirin EC 81 MG tablet Take 81 mg by mouth 2 (two) times daily.     Marland Kitchen atorvastatin (LIPITOR) 10 MG tablet Take 10 mg by mouth daily. Reported on 10/19/2015    . clopidogrel (PLAVIX) 75 MG tablet Take 75 mg by mouth daily.    Marland Kitchen escitalopram (LEXAPRO) 20 MG tablet Take 20 mg by mouth at bedtime.     . famotidine (PEPCID) 20 MG tablet Take 20 mg by mouth 2 (two) times daily.     . fish oil-omega-3 fatty acids 1000 MG capsule Take 1 g by mouth daily.     . insulin glargine (LANTUS) 100 UNIT/ML injection Inject 50 Units into the skin at bedtime.     . INVOKANA 300 MG TABS tablet Take 300 mg by mouth daily before breakfast.   4  . JANUMET 50-1000 MG per tablet Take 1 tablet by mouth 2 (two)  times daily with a meal.     . losartan (COZAAR) 100 MG tablet Take 100 mg by mouth every evening.     . metoprolol succinate (TOPROL-XL) 25 MG 24 hr tablet Take 25 mg by mouth at bedtime.     . Multiple Vitamin (MULITIVITAMIN WITH MINERALS) TABS Take 1 tablet by mouth daily.     No current facility-administered medications for this visit.    Allergies as of 10/19/2015 - Review Complete 10/19/2015  Allergen Reaction Noted  . Hydrocodone-acetaminophen Swelling   . Pravastatin sodium Other (See Comments) 01/30/2015  . Hydrochlorothiazide Itching     ROS:  General: Negative for anorexia, weight loss, fever, chills, fatigue, weakness. ENT: Negative for hoarseness, difficulty swallowing , nasal congestion. CV: Negative for chest pain, angina, palpitations, dyspnea on exertion, peripheral edema.  Respiratory: Negative for dyspnea at rest, dyspnea on exertion, cough, sputum, wheezing.  GI: See history of present illness. GU:  Negative for dysuria, hematuria, urinary incontinence, urinary frequency, nocturnal urination.  Endo: Negative for unusual weight change.    Physical Examination:   BP 137/63 mmHg  Pulse 52  Temp(Src) 97.3 F (36.3 C)  Ht  (1.778 m)  Wt 224 lb 12.8 oz (101.969 kg)  BMI 32.26 kg/m2  General: Well-nourished, well-developed in no acute distress.  Eyes: No icterus. Mouth: Oropharyngeal mucosa moist and pink ,  no lesions erythema or exudate. Lungs: Clear to auscultation bilaterally.  Heart: Regular rate and rhythm, no murmurs rubs or gallops.  Abdomen: Bowel sounds are normal, nontender, nondistended, no hepatosplenomegaly or masses, no abdominal bruits or hernia , no rebound or guarding.   Extremities: No lower extremity edema. No clubbing or deformities. Neuro: Alert and oriented x 4   Skin: Warm and dry, no jaundice.   Psych: Alert and cooperative, normal mood and affect.

## 2015-10-19 NOTE — Assessment & Plan Note (Addendum)
Continue Pepcid 20 mg twice a day as long as it remains efficacious. Anti-reflex measures. Return to the office in one year or sooner if needed.  Discussed with patient, next colonoscopy due 2019.

## 2015-11-05 LAB — HELICOBACTER PYLORI  SPECIAL ANTIGEN: H. PYLORI Antigen: NOT DETECTED

## 2015-11-14 NOTE — Progress Notes (Signed)
Quick Note:  H.pylori eradicated successfully. Please let pt know. ______

## 2015-11-26 ENCOUNTER — Other Ambulatory Visit: Payer: Self-pay

## 2015-11-26 MED ORDER — FAMOTIDINE 20 MG PO TABS: 20.0000 mg | ORAL_TABLET | Freq: Two times a day (BID) | ORAL | Status: DC

## 2016-08-23 ENCOUNTER — Encounter: Payer: Self-pay | Admitting: Internal Medicine

## 2016-12-08 ENCOUNTER — Other Ambulatory Visit: Payer: Self-pay | Admitting: Nurse Practitioner

## 2017-08-23 DIAGNOSIS — I10 Essential (primary) hypertension: Secondary | ICD-10-CM | POA: Diagnosis not present

## 2017-08-23 DIAGNOSIS — E119 Type 2 diabetes mellitus without complications: Secondary | ICD-10-CM | POA: Diagnosis not present

## 2017-08-28 DIAGNOSIS — I10 Essential (primary) hypertension: Secondary | ICD-10-CM | POA: Diagnosis not present

## 2017-08-28 DIAGNOSIS — K219 Gastro-esophageal reflux disease without esophagitis: Secondary | ICD-10-CM | POA: Diagnosis not present

## 2017-08-28 DIAGNOSIS — E1159 Type 2 diabetes mellitus with other circulatory complications: Secondary | ICD-10-CM | POA: Diagnosis not present

## 2017-08-28 DIAGNOSIS — E1169 Type 2 diabetes mellitus with other specified complication: Secondary | ICD-10-CM | POA: Diagnosis not present

## 2017-08-28 DIAGNOSIS — E1165 Type 2 diabetes mellitus with hyperglycemia: Secondary | ICD-10-CM | POA: Diagnosis not present

## 2017-08-28 DIAGNOSIS — F3342 Major depressive disorder, recurrent, in full remission: Secondary | ICD-10-CM | POA: Diagnosis not present

## 2017-10-17 DIAGNOSIS — I10 Essential (primary) hypertension: Secondary | ICD-10-CM | POA: Diagnosis not present

## 2017-10-17 DIAGNOSIS — N5089 Other specified disorders of the male genital organs: Secondary | ICD-10-CM | POA: Diagnosis not present

## 2017-10-18 DIAGNOSIS — N433 Hydrocele, unspecified: Secondary | ICD-10-CM | POA: Diagnosis not present

## 2017-10-18 DIAGNOSIS — N5089 Other specified disorders of the male genital organs: Secondary | ICD-10-CM | POA: Diagnosis not present

## 2017-10-22 ENCOUNTER — Encounter: Payer: Self-pay | Admitting: Internal Medicine

## 2017-11-01 DIAGNOSIS — E1159 Type 2 diabetes mellitus with other circulatory complications: Secondary | ICD-10-CM | POA: Diagnosis not present

## 2017-11-01 DIAGNOSIS — E11319 Type 2 diabetes mellitus with unspecified diabetic retinopathy without macular edema: Secondary | ICD-10-CM | POA: Diagnosis not present

## 2017-11-01 DIAGNOSIS — E1140 Type 2 diabetes mellitus with diabetic neuropathy, unspecified: Secondary | ICD-10-CM | POA: Diagnosis not present

## 2017-11-01 DIAGNOSIS — E1169 Type 2 diabetes mellitus with other specified complication: Secondary | ICD-10-CM | POA: Diagnosis not present

## 2018-01-27 ENCOUNTER — Other Ambulatory Visit: Payer: Self-pay | Admitting: Nurse Practitioner

## 2018-02-26 DIAGNOSIS — R5383 Other fatigue: Secondary | ICD-10-CM | POA: Diagnosis not present

## 2018-02-26 DIAGNOSIS — E119 Type 2 diabetes mellitus without complications: Secondary | ICD-10-CM | POA: Diagnosis not present

## 2018-02-27 DIAGNOSIS — E1169 Type 2 diabetes mellitus with other specified complication: Secondary | ICD-10-CM | POA: Diagnosis not present

## 2018-02-27 DIAGNOSIS — E1159 Type 2 diabetes mellitus with other circulatory complications: Secondary | ICD-10-CM | POA: Diagnosis not present

## 2018-02-27 DIAGNOSIS — F3342 Major depressive disorder, recurrent, in full remission: Secondary | ICD-10-CM | POA: Diagnosis not present

## 2018-02-27 DIAGNOSIS — K219 Gastro-esophageal reflux disease without esophagitis: Secondary | ICD-10-CM | POA: Diagnosis not present

## 2018-02-27 DIAGNOSIS — I10 Essential (primary) hypertension: Secondary | ICD-10-CM | POA: Diagnosis not present

## 2018-03-06 DIAGNOSIS — E1165 Type 2 diabetes mellitus with hyperglycemia: Secondary | ICD-10-CM | POA: Diagnosis not present

## 2018-03-06 DIAGNOSIS — E1140 Type 2 diabetes mellitus with diabetic neuropathy, unspecified: Secondary | ICD-10-CM | POA: Diagnosis not present

## 2018-03-06 DIAGNOSIS — Z794 Long term (current) use of insulin: Secondary | ICD-10-CM | POA: Diagnosis not present

## 2018-03-06 DIAGNOSIS — E1159 Type 2 diabetes mellitus with other circulatory complications: Secondary | ICD-10-CM | POA: Diagnosis not present

## 2018-03-06 DIAGNOSIS — E11319 Type 2 diabetes mellitus with unspecified diabetic retinopathy without macular edema: Secondary | ICD-10-CM | POA: Diagnosis not present

## 2018-03-06 DIAGNOSIS — E039 Hypothyroidism, unspecified: Secondary | ICD-10-CM | POA: Diagnosis not present

## 2018-03-06 DIAGNOSIS — E1169 Type 2 diabetes mellitus with other specified complication: Secondary | ICD-10-CM | POA: Diagnosis not present

## 2018-03-06 DIAGNOSIS — I10 Essential (primary) hypertension: Secondary | ICD-10-CM | POA: Diagnosis not present

## 2018-07-23 DIAGNOSIS — I10 Essential (primary) hypertension: Secondary | ICD-10-CM | POA: Diagnosis not present

## 2018-07-23 DIAGNOSIS — E1159 Type 2 diabetes mellitus with other circulatory complications: Secondary | ICD-10-CM | POA: Diagnosis not present

## 2018-07-23 DIAGNOSIS — E039 Hypothyroidism, unspecified: Secondary | ICD-10-CM | POA: Diagnosis not present

## 2018-08-07 DIAGNOSIS — E039 Hypothyroidism, unspecified: Secondary | ICD-10-CM | POA: Diagnosis not present

## 2018-08-07 DIAGNOSIS — I10 Essential (primary) hypertension: Secondary | ICD-10-CM | POA: Diagnosis not present

## 2018-08-07 DIAGNOSIS — E11319 Type 2 diabetes mellitus with unspecified diabetic retinopathy without macular edema: Secondary | ICD-10-CM | POA: Diagnosis not present

## 2018-08-07 DIAGNOSIS — E1169 Type 2 diabetes mellitus with other specified complication: Secondary | ICD-10-CM | POA: Diagnosis not present

## 2018-08-07 DIAGNOSIS — E1165 Type 2 diabetes mellitus with hyperglycemia: Secondary | ICD-10-CM | POA: Diagnosis not present

## 2018-08-07 DIAGNOSIS — Z794 Long term (current) use of insulin: Secondary | ICD-10-CM | POA: Diagnosis not present

## 2018-08-07 DIAGNOSIS — E1140 Type 2 diabetes mellitus with diabetic neuropathy, unspecified: Secondary | ICD-10-CM | POA: Diagnosis not present

## 2018-08-07 DIAGNOSIS — E1159 Type 2 diabetes mellitus with other circulatory complications: Secondary | ICD-10-CM | POA: Diagnosis not present

## 2018-08-29 DIAGNOSIS — K219 Gastro-esophageal reflux disease without esophagitis: Secondary | ICD-10-CM | POA: Diagnosis not present

## 2018-08-29 DIAGNOSIS — F3342 Major depressive disorder, recurrent, in full remission: Secondary | ICD-10-CM | POA: Diagnosis not present

## 2018-08-29 DIAGNOSIS — I10 Essential (primary) hypertension: Secondary | ICD-10-CM | POA: Diagnosis not present

## 2018-08-29 DIAGNOSIS — E1169 Type 2 diabetes mellitus with other specified complication: Secondary | ICD-10-CM | POA: Diagnosis not present

## 2018-08-29 DIAGNOSIS — E1159 Type 2 diabetes mellitus with other circulatory complications: Secondary | ICD-10-CM | POA: Diagnosis not present

## 2018-09-02 DIAGNOSIS — Z1211 Encounter for screening for malignant neoplasm of colon: Secondary | ICD-10-CM | POA: Diagnosis not present

## 2018-10-30 DIAGNOSIS — E1165 Type 2 diabetes mellitus with hyperglycemia: Secondary | ICD-10-CM | POA: Diagnosis not present

## 2018-10-30 DIAGNOSIS — Z794 Long term (current) use of insulin: Secondary | ICD-10-CM | POA: Diagnosis not present

## 2018-12-11 DIAGNOSIS — E1140 Type 2 diabetes mellitus with diabetic neuropathy, unspecified: Secondary | ICD-10-CM | POA: Diagnosis not present

## 2018-12-11 DIAGNOSIS — E1159 Type 2 diabetes mellitus with other circulatory complications: Secondary | ICD-10-CM | POA: Diagnosis not present

## 2018-12-11 DIAGNOSIS — E11319 Type 2 diabetes mellitus with unspecified diabetic retinopathy without macular edema: Secondary | ICD-10-CM | POA: Diagnosis not present

## 2018-12-11 DIAGNOSIS — E1169 Type 2 diabetes mellitus with other specified complication: Secondary | ICD-10-CM | POA: Diagnosis not present

## 2019-02-07 DIAGNOSIS — E785 Hyperlipidemia, unspecified: Secondary | ICD-10-CM | POA: Diagnosis not present

## 2019-02-07 DIAGNOSIS — E1169 Type 2 diabetes mellitus with other specified complication: Secondary | ICD-10-CM | POA: Diagnosis not present

## 2019-02-12 DIAGNOSIS — E1169 Type 2 diabetes mellitus with other specified complication: Secondary | ICD-10-CM | POA: Diagnosis not present

## 2019-02-12 DIAGNOSIS — E1140 Type 2 diabetes mellitus with diabetic neuropathy, unspecified: Secondary | ICD-10-CM | POA: Diagnosis not present

## 2019-02-12 DIAGNOSIS — Z794 Long term (current) use of insulin: Secondary | ICD-10-CM | POA: Diagnosis not present

## 2019-02-12 DIAGNOSIS — E11319 Type 2 diabetes mellitus with unspecified diabetic retinopathy without macular edema: Secondary | ICD-10-CM | POA: Diagnosis not present

## 2019-02-20 ENCOUNTER — Other Ambulatory Visit: Payer: Self-pay | Admitting: Nurse Practitioner

## 2019-05-19 ENCOUNTER — Other Ambulatory Visit: Payer: Self-pay

## 2019-05-20 ENCOUNTER — Ambulatory Visit: Payer: BC Managed Care – PPO | Admitting: Family Medicine

## 2019-05-20 ENCOUNTER — Encounter: Payer: Self-pay | Admitting: Family Medicine

## 2019-05-20 ENCOUNTER — Ambulatory Visit (INDEPENDENT_AMBULATORY_CARE_PROVIDER_SITE_OTHER): Payer: BC Managed Care – PPO

## 2019-05-20 VITALS — BP 130/57 | HR 57 | Temp 96.9°F | Resp 20 | Ht 70.0 in | Wt 205.0 lb

## 2019-05-20 DIAGNOSIS — I2510 Atherosclerotic heart disease of native coronary artery without angina pectoris: Secondary | ICD-10-CM

## 2019-05-20 DIAGNOSIS — M4722 Other spondylosis with radiculopathy, cervical region: Secondary | ICD-10-CM | POA: Diagnosis not present

## 2019-05-20 DIAGNOSIS — I10 Essential (primary) hypertension: Secondary | ICD-10-CM

## 2019-05-20 DIAGNOSIS — K219 Gastro-esophageal reflux disease without esophagitis: Secondary | ICD-10-CM

## 2019-05-20 DIAGNOSIS — Z1159 Encounter for screening for other viral diseases: Secondary | ICD-10-CM

## 2019-05-20 DIAGNOSIS — E119 Type 2 diabetes mellitus without complications: Secondary | ICD-10-CM | POA: Diagnosis not present

## 2019-05-20 DIAGNOSIS — Z114 Encounter for screening for human immunodeficiency virus [HIV]: Secondary | ICD-10-CM

## 2019-05-20 DIAGNOSIS — Z23 Encounter for immunization: Secondary | ICD-10-CM

## 2019-05-20 DIAGNOSIS — E559 Vitamin D deficiency, unspecified: Secondary | ICD-10-CM | POA: Diagnosis not present

## 2019-05-20 DIAGNOSIS — F3341 Major depressive disorder, recurrent, in partial remission: Secondary | ICD-10-CM

## 2019-05-20 DIAGNOSIS — G8929 Other chronic pain: Secondary | ICD-10-CM

## 2019-05-20 DIAGNOSIS — M25512 Pain in left shoulder: Secondary | ICD-10-CM

## 2019-05-20 DIAGNOSIS — Z125 Encounter for screening for malignant neoplasm of prostate: Secondary | ICD-10-CM

## 2019-05-20 LAB — LIPID PANEL
Chol/HDL Ratio: 3.2 ratio (ref 0.0–5.0)
Cholesterol, Total: 96 mg/dL — ABNORMAL LOW (ref 100–199)
HDL: 30 mg/dL — ABNORMAL LOW (ref >39)
LDL Chol Calc (NIH): 48 mg/dL (ref 0–99)
Triglycerides: 94 mg/dL (ref 0–149)
VLDL Cholesterol Cal: 18 mg/dL (ref 5–40)

## 2019-05-20 LAB — CBC WITH DIFFERENTIAL/PLATELET
Basophils Absolute: 0.1 10*3/uL (ref 0.0–0.2)
Basos: 1 %
EOS (ABSOLUTE): 0.2 10*3/uL (ref 0.0–0.4)
Eos: 2 %
Hematocrit: 43.6 % (ref 37.5–51.0)
Hemoglobin: 14.5 g/dL (ref 13.0–17.7)
Immature Grans (Abs): 0 10*3/uL (ref 0.0–0.1)
Immature Granulocytes: 0 %
Lymphocytes Absolute: 3.3 10*3/uL — ABNORMAL HIGH (ref 0.7–3.1)
Lymphs: 30 %
MCH: 31 pg (ref 26.6–33.0)
MCHC: 33.3 g/dL (ref 31.5–35.7)
MCV: 93 fL (ref 79–97)
Monocytes Absolute: 0.8 10*3/uL (ref 0.1–0.9)
Monocytes: 7 %
Neutrophils Absolute: 6.7 10*3/uL (ref 1.4–7.0)
Neutrophils: 60 %
Platelets: 263 10*3/uL (ref 150–450)
RBC: 4.68 x10E6/uL (ref 4.14–5.80)
RDW: 12.7 % (ref 11.6–15.4)
WBC: 11.1 10*3/uL — ABNORMAL HIGH (ref 3.4–10.8)

## 2019-05-20 LAB — CMP14+EGFR
ALT: 15 IU/L (ref 0–44)
AST: 14 IU/L (ref 0–40)
Albumin/Globulin Ratio: 1.7 (ref 1.2–2.2)
Albumin: 4.3 g/dL (ref 3.8–4.8)
Alkaline Phosphatase: 91 IU/L (ref 39–117)
BUN/Creatinine Ratio: 10 (ref 10–24)
BUN: 12 mg/dL (ref 8–27)
Bilirubin Total: 0.6 mg/dL (ref 0.0–1.2)
CO2: 23 mmol/L (ref 20–29)
Calcium: 9.4 mg/dL (ref 8.6–10.2)
Chloride: 101 mmol/L (ref 96–106)
Creatinine, Ser: 1.18 mg/dL (ref 0.76–1.27)
GFR calc Af Amer: 75 mL/min/{1.73_m2} (ref >59)
GFR calc non Af Amer: 65 mL/min/{1.73_m2} (ref >59)
Globulin, Total: 2.6 g/dL (ref 1.5–4.5)
Glucose: 99 mg/dL (ref 65–99)
Potassium: 4.7 mmol/L (ref 3.5–5.2)
Sodium: 138 mmol/L (ref 134–144)
Total Protein: 6.9 g/dL (ref 6.0–8.5)

## 2019-05-20 LAB — BAYER DCA HB A1C WAIVED: HB A1C (BAYER DCA - WAIVED): 6.7 % (ref <7.0)

## 2019-05-20 MED ORDER — FAMOTIDINE 20 MG PO TABS: 20.0000 mg | ORAL_TABLET | Freq: Two times a day (BID) | ORAL | 3 refills | Status: AC

## 2019-05-20 MED ORDER — ROSUVASTATIN CALCIUM 10 MG PO TABS: 10.0000 mg | ORAL_TABLET | Freq: Every day | ORAL | 1 refills | Status: AC

## 2019-05-20 MED ORDER — CELECOXIB 200 MG PO CAPS: 200.0000 mg | ORAL_CAPSULE | Freq: Every day | ORAL | 5 refills | Status: AC

## 2019-05-20 NOTE — Progress Notes (Signed)
Subjective:  Patient ID: [name] [name], male    DOB: 1956/03/10  Age: 63 y.o. MRN: [id number]  CC: Establish Care   HPI DENIS CARREON presents forFollow-up of diabetes. Patient checks blood sugar at home . He has been seeing Dr. Hartford Poli, endocrinology for DM. Has noted much impovement with the regimen given there Patient denies symptoms such as polyuria, polydipsia, excessive hunger, nausea No significant hypoglycemic spells noted. He does not exercise regularly. Medications reviewed. Pt reports taking them regularly without complication/adverse reaction being reported today.   Pt. Has significant depression and anxiety for which he has been treated for 15 years using Lexapro. Recently bupropion was added. He tends to worry a lot. He has too much time to think at work. He is a Psychologist, occupational. So much is automated now leaving him too much time to thinnk.   GAD 7 : Generalized Anxiety Score 05/20/2019  Nervous, Anxious, on Edge 0  Control/stop worrying 0  Worry too much - different things 0  Trouble relaxing 0  Restless 0  Easily annoyed or irritable 0  Afraid - awful might happen 0  Total GAD 7 Score 0  Anxiety Difficulty Not difficult at all    PHQ9 SCORE ONLY 05/20/2019  Score 0     History Hussien has a past medical history of Chest pain, Coronary artery disease, Diabetes mellitus without complication ([place]), Dyspnea, Hyperlipidemia, Hypertension, Right bundle branch block, and Sleep apnea.  He has a past surgical history that includes Cardiovascular stress test (SEPT 10 2010); Cardiovascular stress test (November 01 2009); Cardiovascular stress test (December 08 2011); Cardiac catheterization (01 /21/2011); Cardiac catheterization (04/ 24 /2013); sleep  study report (05/03/2007); SLEEP STUDY (07/ 06/2007); SLEEP STUDY (07 11 2008); ADULT ECHOCARDIOGRAPHY (11/08/2004); Carotid Doppler (11 /23/2013); left heart catheterization with coronary angiogram (N/A, 12/13/2011);  Esophagogastroduodenoscopy (N/A, 01/30/2015); Esophagogastroduodenoscopy (11/2007); Colonoscopy (11/2007); Esophagogastroduodenoscopy (10/2007); Esophagogastroduodenoscopy (N/A, 04/16/2015); and Esophageal dilation (N/A, 04/16/2015).   His family history includes Diabetes in his brother, mother, and sister.He reports that he quit smoking about 24 years ago. He has never used smokeless tobacco. He reports that he does not drink alcohol or use drugs.  Current Outpatient Medications on File Prior to Visit  Medication Sig Dispense Refill  . aspirin EC 81 MG tablet Take 81 mg by mouth 2 (two) times daily.     Marland Kitchen buPROPion (WELLBUTRIN SR) 100 MG 12 hr tablet TAKE ONE TABLET BY MOUTH TWICE DAILY    . Canagliflozin-metFORMIN HCl (INVOKAMET) [phone] MG TABS TAKE 1 TABLET TWICE A DAY BEFORE A MEAL    . clopidogrel (PLAVIX) 75 MG tablet Take 75 mg by mouth daily.    Marland Kitchen escitalopram (LEXAPRO) 20 MG tablet Take 20 mg by mouth at bedtime.     . fish oil-omega-3 fatty acids 1000 MG capsule Take 1 g by mouth daily.     . Insulin Degludec 200 UNIT/ML SOPN Inject into the skin.    . Insulin Pen Needle (BD PEN NEEDLE NANO U/F) 32G X 4 MM MISC TEST TWICE DAILY    . losartan (COZAAR) 100 MG tablet Take 100 mg by mouth every evening.     . metoprolol succinate (TOPROL-XL) 25 MG 24 hr tablet Take 25 mg by mouth at bedtime.     . Multiple Vitamin (MULITIVITAMIN WITH MINERALS) TABS Take 1 tablet by mouth daily.    . Semaglutide, 1 MG/DOSE, (OZEMPIC, 1 MG/DOSE,) 2 MG/1.5ML SOPN Inject into the skin.     No current  facility-administered medications on file prior to visit.     ROS Review of Systems  Constitutional: Negative.   HENT: Negative.   Eyes: Negative for visual disturbance.  Respiratory: Negative for cough and shortness of breath.   Cardiovascular: Negative for chest pain and leg swelling.  Gastrointestinal: Negative for abdominal pain, diarrhea, nausea and vomiting.  Genitourinary: Negative for difficulty  urinating.  Musculoskeletal: Positive for arthralgias (Pain radiating from neck through shoulder and into arm. ). Negative for myalgias.  Skin: Negative for rash.  Neurological: Negative for headaches.  Psychiatric/Behavioral: Negative for sleep disturbance.    Objective:  BP (!) 130/57   Pulse (!) 57   Temp (!) 96.9 F (36.1 C) (Oral)   Resp 20   Ht '5\' 10"'$  (1.778 m)   Wt 205 lb (93 kg)   SpO2 98%   BMI 29.41 kg/m   BP Readings from Last 3 Encounters:  05/20/19 (!) 130/57  10/19/15 137/63  08/27/15 140/82    Wt Readings from Last 3 Encounters:  05/20/19 205 lb (93 kg)  10/19/15 224 lb 12.8 oz (102 kg)  08/27/15 227 lb 12.8 oz (103.3 kg)     Physical Exam Constitutional:      Appearance: He is well-developed.  HENT:     Head: Normocephalic and atraumatic.  Eyes:     Pupils: Pupils are equal, round, and reactive to light.  Neck:     Musculoskeletal: Normal range of motion.     Thyroid: No thyromegaly.     Trachea: No tracheal deviation.  Cardiovascular:     Rate and Rhythm: Normal rate and regular rhythm.     Heart sounds: Normal heart sounds. No murmur. No friction rub. No gallop.   Pulmonary:     Breath sounds: Normal breath sounds. No wheezing or rales.  Abdominal:     General: Bowel sounds are normal. There is no distension.     Palpations: Abdomen is soft. There is no mass.     Tenderness: There is no abdominal tenderness.     Hernia: There is no hernia in the left inguinal area.  Genitourinary:    Penis: Normal.      Scrotum/Testes: Normal.  Musculoskeletal: Normal range of motion.  Lymphadenopathy:     Cervical: No cervical adenopathy.  Skin:    General: Skin is warm and dry.  Neurological:     Mental Status: He is alert and oriented to person, place, and time.       Assessment & Plan:   Derris was seen today for establish care.  Diagnoses and all orders for this visit:  Essential hypertension -     CMP14+EGFR -     CBC with  Differential/Platelet  Type 2 diabetes mellitus without complication, unspecified whether long term insulin use (HCC) -     Bayer DCA Hb A1c Waived -     Lipid panel -     Microalbumin / creatinine urine ratio -     CBC with Differential/Platelet -     Urinalysis  Gastroesophageal reflux disease without esophagitis -     CBC with Differential/Platelet  Chronic left shoulder pain -     CBC with Differential/Platelet -     celecoxib (CELEBREX) 200 MG capsule; Take 1 capsule (200 mg total) by mouth daily. With food -     DG Cervical Spine Complete; Future  ASCVD (arteriosclerotic cardiovascular disease) -     CBC with Differential/Platelet  Recurrent major depressive disorder, in partial remission ([place]) -  CBC with Differential/Platelet  Need for hepatitis C screening test -     Hepatitis C antibody -     CBC with Differential/Platelet  Encounter for screening for HIV -     HIV Antibody (routine testing w rflx) -     CBC with Differential/Platelet  Screening for prostate cancer -     PSA Total (Reflex To Free)  Vitamin D deficiency -     VITAMIN D 25 Hydroxy (Vit-D Deficiency, Fractures)  Other orders -     rosuvastatin (CRESTOR) 10 MG tablet; Take 1 tablet (10 mg total) by mouth daily. For cholesterol -     famotidine (PEPCID) 20 MG tablet; Take 1 tablet (20 mg total) by mouth 2 (two) times daily. -     Varicella-zoster vaccine IM (Shingrix)      I have discontinued Danil T. Gendron's Janumet, insulin glargine, Invokana, atorvastatin, and pravastatin. I have also changed his famotidine. Additionally, I am having him start on celecoxib and rosuvastatin. Lastly, I am having him maintain his clopidogrel, aspirin EC, escitalopram, metoprolol succinate, fish oil-omega-3 fatty acids, multivitamin with minerals, losartan, buPROPion, Invokamet, Insulin Degludec, BD Pen Needle Nano U/F, and Ozempic (1 MG/DOSE).  Meds ordered this encounter  Medications  . celecoxib (CELEBREX)  200 MG capsule    Sig: Take 1 capsule (200 mg total) by mouth daily. With food    Dispense:  30 capsule    Refill:  5  . rosuvastatin (CRESTOR) 10 MG tablet    Sig: Take 1 tablet (10 mg total) by mouth daily. For cholesterol    Dispense:  90 tablet    Refill:  1  . famotidine (PEPCID) 20 MG tablet    Sig: Take 1 tablet (20 mg total) by mouth 2 (two) times daily.    Dispense:  180 tablet    Refill:  3     Follow-up: Return in about 3 months (around 08/19/2019).  Claretta Fraise, M.D.

## 2019-05-21 LAB — HEPATITIS C ANTIBODY: Hep C Virus Ab: <0.1 s/co ratio (ref 0.0–0.9)

## 2019-05-21 LAB — MICROALBUMIN / CREATININE URINE RATIO
Creatinine, Urine: 69.1 mg/dL
Microalb/Creat Ratio: <4 mg/g creat (ref 0–29)
Microalbumin, Urine: <3 ug/mL

## 2019-05-21 LAB — VITAMIN D 25 HYDROXY (VIT D DEFICIENCY, FRACTURES): Vit D, 25-Hydroxy: 26.5 ng/mL — ABNORMAL LOW (ref 30.0–100.0)

## 2019-05-21 LAB — PSA TOTAL (REFLEX TO FREE): Prostate Specific Ag, Serum: 0.3 ng/mL (ref 0.0–4.0)

## 2019-05-21 LAB — HIV ANTIBODY (ROUTINE TESTING W REFLEX): HIV Screen 4th Generation wRfx: NONREACTIVE

## 2019-05-22 ENCOUNTER — Other Ambulatory Visit: Payer: Self-pay | Admitting: Family Medicine

## 2019-05-22 ENCOUNTER — Other Ambulatory Visit: Payer: Self-pay | Admitting: *Deleted

## 2019-05-22 MED ORDER — VITAMIN D (ERGOCALCIFEROL) 1.25 MG (50000 UNIT) PO CAPS: 50000.0000 [IU] | ORAL_CAPSULE | ORAL | 3 refills | Status: AC

## 2019-05-27 ENCOUNTER — Telehealth: Payer: Self-pay | Admitting: Family Medicine

## 2019-05-27 NOTE — Telephone Encounter (Signed)
Returned pharmacy call and they want to know if it is okay for patient to take Celbrex due to his sulfa allergie. Please advise

## 2019-05-27 NOTE — Telephone Encounter (Signed)
Pharmacy aware

## 2019-05-27 NOTE — Telephone Encounter (Signed)
HE has taken it in the past without reaction. So he should be okay.

## 2019-06-11 DIAGNOSIS — E1140 Type 2 diabetes mellitus with diabetic neuropathy, unspecified: Secondary | ICD-10-CM | POA: Diagnosis not present

## 2019-06-11 DIAGNOSIS — Z794 Long term (current) use of insulin: Secondary | ICD-10-CM | POA: Diagnosis not present

## 2019-06-11 DIAGNOSIS — E1169 Type 2 diabetes mellitus with other specified complication: Secondary | ICD-10-CM | POA: Diagnosis not present

## 2019-06-11 DIAGNOSIS — E11319 Type 2 diabetes mellitus with unspecified diabetic retinopathy without macular edema: Secondary | ICD-10-CM | POA: Diagnosis not present

## 2019-07-18 DIAGNOSIS — M25511 Pain in right shoulder: Secondary | ICD-10-CM | POA: Diagnosis not present

## 2019-07-18 DIAGNOSIS — Z6830 Body mass index (BMI) 30.0-30.9, adult: Secondary | ICD-10-CM | POA: Diagnosis not present

## 2019-07-18 DIAGNOSIS — M7581 Other shoulder lesions, right shoulder: Secondary | ICD-10-CM | POA: Diagnosis not present

## 2019-08-08 ENCOUNTER — Other Ambulatory Visit: Payer: Self-pay

## 2019-08-11 ENCOUNTER — Other Ambulatory Visit: Payer: Self-pay

## 2019-08-11 ENCOUNTER — Telehealth: Payer: Self-pay | Admitting: Family Medicine

## 2019-08-11 ENCOUNTER — Encounter: Payer: Self-pay | Admitting: Family Medicine

## 2019-08-11 ENCOUNTER — Ambulatory Visit: Payer: BC Managed Care – PPO | Admitting: Family Medicine

## 2019-08-11 VITALS — BP 128/56 | HR 84 | Temp 97.4°F | Resp 20 | Ht 70.0 in | Wt 203.0 lb

## 2019-08-11 DIAGNOSIS — G8929 Other chronic pain: Secondary | ICD-10-CM | POA: Diagnosis not present

## 2019-08-11 DIAGNOSIS — I10 Essential (primary) hypertension: Secondary | ICD-10-CM | POA: Diagnosis not present

## 2019-08-11 DIAGNOSIS — E119 Type 2 diabetes mellitus without complications: Secondary | ICD-10-CM

## 2019-08-11 DIAGNOSIS — M25511 Pain in right shoulder: Secondary | ICD-10-CM | POA: Diagnosis not present

## 2019-08-11 LAB — BAYER DCA HB A1C WAIVED: HB A1C (BAYER DCA - WAIVED): 8.3 % — ABNORMAL HIGH (ref <7.0)

## 2019-08-11 MED ORDER — INSULIN DEGLUDEC 200 UNIT/ML ~~LOC~~ SOPN: 30.0000 [IU] | PEN_INJECTOR | Freq: Every day | SUBCUTANEOUS | 5 refills | Status: AC

## 2019-08-11 MED ORDER — PREDNISONE 10 MG PO TABS: ORAL_TABLET | ORAL | 0 refills | Status: AC

## 2019-08-11 MED ORDER — PREDNISONE 10 MG PO TABS: ORAL_TABLET | ORAL | 0 refills | Status: DC

## 2019-08-11 NOTE — Progress Notes (Signed)
Subjective:  Patient ID: [name] [name],  male    DOB: 1956/05/06  Age: 63 y.o.    CC: Medical Management of Chronic Issues   HPI [name] [name] presents for  follow-up of hypertension. Patient has no history of headache chest pain or shortness of breath or recent cough. Patient also denies symptoms of TIA such as numbness weakness lateralizing. Patient denies side effects from medication. States taking it regularly.  Patient also  in for follow-up of elevated cholesterol. Doing well without complaints on current medication. Denies side effects  including myalgia and arthralgia and nausea. Also in today for liver function testing. Currently no chest pain, shortness of breath or other cardiovascular related symptoms noted.  Follow-up of diabetes. Patient does check blood sugar at home. Readings run between 140 and 200 Patient denies symptoms such as excessive hunger or urinary frequency, excessive hunger, nausea No significant hypoglycemic spells noted. Medications reviewed. Pt reports taking them regularly. Pt. denies complication/adverse reaction today.   Two months of right shoulder pain. Increasing in spite of seeing urgent care and getting a high potency ibuprofen shot on November 27. NKI. Can not lift over waist height. Feels numb at the ball of the shoulder.   History [name] [name] has a past medical history of Chest pain, Coronary artery disease, Diabetes mellitus without complication ([place]), Dyspnea, Hyperlipidemia, Hypertension, Right bundle branch block, and Sleep apnea.   He has a past surgical history that includes Cardiovascular stress test (SEPT 10 2010); Cardiovascular stress test (November 01 2009); Cardiovascular stress test (December 08 2011); Cardiac catheterization (01 /21/2011); Cardiac catheterization (04/ 24 /2013); sleep  study report (05/03/2007); SLEEP STUDY (07/ 06/2007); SLEEP STUDY (07 11 2008); ADULT ECHOCARDIOGRAPHY (11/08/2004); Carotid Doppler (11 /23/2013); left heart  catheterization with coronary angiogram (N/A, 12/13/2011); Esophagogastroduodenoscopy (N/A, 01/30/2015); Esophagogastroduodenoscopy (11/2007); Colonoscopy (11/2007); Esophagogastroduodenoscopy (10/2007); Esophagogastroduodenoscopy (N/A, 04/16/2015); and Esophageal dilation (N/A, 04/16/2015).   His family history includes Diabetes in his brother, mother, and sister.He reports that he quit smoking about 24 years ago. He has never used smokeless tobacco. He reports that he does not drink alcohol or use drugs.  Current Outpatient Medications on File Prior to Visit  Medication Sig Dispense Refill  . aspirin EC 81 MG tablet Take 81 mg by mouth 2 (two) times daily.     Marland Kitchen buPROPion (WELLBUTRIN SR) 100 MG 12 hr tablet TAKE ONE TABLET BY MOUTH TWICE DAILY    . Canagliflozin-metFORMIN HCl (INVOKAMET) [phone] MG TABS TAKE 1 TABLET TWICE A DAY BEFORE A MEAL    . celecoxib (CELEBREX) 200 MG capsule Take 1 capsule (200 mg total) by mouth daily. With food 30 capsule 5  . clopidogrel (PLAVIX) 75 MG tablet Take 75 mg by mouth daily.    Marland Kitchen escitalopram (LEXAPRO) 20 MG tablet Take 20 mg by mouth at bedtime.     . famotidine (PEPCID) 20 MG tablet Take 1 tablet (20 mg total) by mouth 2 (two) times daily. 180 tablet 3  . fish oil-omega-3 fatty acids 1000 MG capsule Take 1 g by mouth daily.     . Insulin Pen Needle (BD PEN NEEDLE NANO U/F) 32G X 4 MM MISC TEST TWICE DAILY    . losartan (COZAAR) 100 MG tablet Take 100 mg by mouth every evening.     . metoprolol succinate (TOPROL-XL) 25 MG 24 hr tablet Take 25 mg by mouth at bedtime.     . Multiple Vitamin (MULITIVITAMIN WITH MINERALS) TABS Take 1 tablet by mouth daily.    Marland Kitchen  rosuvastatin (CRESTOR) 10 MG tablet Take 1 tablet (10 mg total) by mouth daily. For cholesterol 90 tablet 1  . Semaglutide, 1 MG/DOSE, (OZEMPIC, 1 MG/DOSE,) 2 MG/1.5ML SOPN Inject into the skin.    . Vitamin D, Ergocalciferol, (DRISDOL) 1.25 MG (50000 UT) CAPS capsule Take 1 capsule (50,000 Units total) by  mouth every 7 (seven) days. 13 capsule 3   No current facility-administered medications on file prior to visit.    ROS Review of Systems  Constitutional: Negative.   HENT: Negative.   Eyes: Negative for visual disturbance.  Respiratory: Negative for cough and shortness of breath.   Cardiovascular: Negative for chest pain and leg swelling.  Gastrointestinal: Negative for abdominal pain, diarrhea, nausea and vomiting.  Genitourinary: Negative for difficulty urinating.  Musculoskeletal: Negative for myalgias. Arthralgias: right shoulder.  Skin: Negative for rash.  Neurological: Negative for headaches.  Psychiatric/Behavioral: Negative for sleep disturbance.    Objective:  BP (!) 128/56   Pulse 84   Temp (!) 97.4 F (36.3 C) (Temporal)   Resp 20   Ht 5' 10"  (1.778 m)   Wt 203 lb (92.1 kg)   SpO2 98%   BMI 29.13 kg/m   BP Readings from Last 3 Encounters:  08/11/19 (!) 128/56  05/20/19 (!) 130/57  10/19/15 137/63    Wt Readings from Last 3 Encounters:  08/11/19 203 lb (92.1 kg)  05/20/19 205 lb (93 kg)  10/19/15 224 lb 12.8 oz (102 kg)     Physical Exam Constitutional:      General: He is not in acute distress.    Appearance: He is well-developed.  HENT:     Head: Normocephalic and atraumatic.     Right Ear: External ear normal.     Left Ear: External ear normal.     Nose: Nose normal.  Eyes:     Conjunctiva/sclera: Conjunctivae normal.     Pupils: Pupils are equal, round, and reactive to light.  Cardiovascular:     Rate and Rhythm: Normal rate and regular rhythm.     Heart sounds: Normal heart sounds. No murmur.  Pulmonary:     Effort: Pulmonary effort is normal. No respiratory distress.     Breath sounds: Normal breath sounds. No wheezing or rales.  Abdominal:     Palpations: Abdomen is soft.     Tenderness: There is no abdominal tenderness.  Musculoskeletal:     Cervical back: Normal range of motion and neck supple.     Comments: Tender over anterior  and superior right shoulder. Weak for abduction. Cannot actively raise over 60 degrees.   Skin:    General: Skin is warm and dry.  Neurological:     Mental Status: He is alert and oriented to person, place, and time.     Deep Tendon Reflexes: Reflexes are normal and symmetric.  Psychiatric:        Behavior: Behavior normal.        Thought Content: Thought content normal.        Judgment: Judgment normal.     Diabetic Foot Exam - Simple   No data filed        Assessment & Plan:   [name] [name] was seen today for medical management of chronic issues.  Diagnoses and all orders for this visit:  Essential hypertension -     CMP14+EGFR  Type 2 diabetes mellitus without complication, unspecified whether long term insulin use (HCC) -     hgba1c -     Lipid panel -  Insulin Degludec 200 UNIT/ML SOPN; Inject 30 Units into the skin daily.  Chronic right shoulder pain -     Physical Therapy  Other orders -     Discontinue: predniSONE (DELTASONE) 10 MG tablet; Take 5 daily for 2 days followed by 4,3,2 and 1 for 2 days each.   I have changed [name] [name]'s Insulin Degludec. I am also having him maintain his clopidogrel, aspirin EC, escitalopram, metoprolol succinate, fish oil-omega-3 fatty acids, multivitamin with minerals, losartan, buPROPion, Invokamet, BD Pen Needle Nano U/F, Ozempic (1 MG/DOSE), celecoxib, rosuvastatin, famotidine, and Vitamin D (Ergocalciferol).  Meds ordered this encounter  Medications  . Insulin Degludec 200 UNIT/ML SOPN    Sig: Inject 30 Units into the skin daily.    Dispense:  45 mL    Refill:  5  . DISCONTD: predniSONE (DELTASONE) 10 MG tablet    Sig: Take 5 daily for 2 days followed by 4,3,2 and 1 for 2 days each.    Dispense:  30 tablet    Refill:  0     Follow-up: Return in about 3 months (around 11/09/2019).  Claretta Fraise, M.D.

## 2019-08-11 NOTE — Telephone Encounter (Signed)
[facility]  [facility] [place]. Patient made aware

## 2019-08-12 LAB — CMP14+EGFR
ALT: 45 IU/L — ABNORMAL HIGH (ref 0–44)
AST: 40 IU/L (ref 0–40)
Albumin/Globulin Ratio: 1.4 (ref 1.2–2.2)
Albumin: 4.1 g/dL (ref 3.8–4.8)
Alkaline Phosphatase: 88 IU/L (ref 39–117)
BUN/Creatinine Ratio: 13 (ref 10–24)
BUN: 15 mg/dL (ref 8–27)
Bilirubin Total: 0.4 mg/dL (ref 0.0–1.2)
CO2: 21 mmol/L (ref 20–29)
Calcium: 9.5 mg/dL (ref 8.6–10.2)
Chloride: 101 mmol/L (ref 96–106)
Creatinine, Ser: 1.16 mg/dL (ref 0.76–1.27)
GFR calc Af Amer: 77 mL/min/{1.73_m2} (ref >59)
GFR calc non Af Amer: 66 mL/min/{1.73_m2} (ref >59)
Globulin, Total: 3 g/dL (ref 1.5–4.5)
Glucose: 152 mg/dL — ABNORMAL HIGH (ref 65–99)
Potassium: 4.2 mmol/L (ref 3.5–5.2)
Sodium: 139 mmol/L (ref 134–144)
Total Protein: 7.1 g/dL (ref 6.0–8.5)

## 2019-08-12 LAB — LIPID PANEL
Chol/HDL Ratio: 2.5 ratio (ref 0.0–5.0)
Cholesterol, Total: 76 mg/dL — ABNORMAL LOW (ref 100–199)
HDL: 31 mg/dL — ABNORMAL LOW (ref >39)
LDL Chol Calc (NIH): 23 mg/dL (ref 0–99)
Triglycerides: 119 mg/dL (ref 0–149)
VLDL Cholesterol Cal: 22 mg/dL (ref 5–40)

## 2019-08-12 NOTE — Progress Notes (Signed)
Hello Blakely,  Your lab result is normal and/or stable.Some minor variations that are not significant are commonly marked abnormal, but do not represent any medical problem for you.  Best regards, Debera Sterba, M.D.

## 2019-08-17 ENCOUNTER — Emergency Department (HOSPITAL_COMMUNITY)
Admission: EM | Admit: 2019-08-17 | Discharge: 2019-08-17 | Disposition: A | Discharge status: Home / Self Care | Payer: BC Managed Care – PPO | Attending: Emergency Medicine | Admitting: Emergency Medicine

## 2019-08-17 ENCOUNTER — Encounter (HOSPITAL_COMMUNITY): Payer: Self-pay

## 2019-08-17 ENCOUNTER — Other Ambulatory Visit: Payer: Self-pay

## 2019-08-17 ENCOUNTER — Emergency Department (HOSPITAL_COMMUNITY): Payer: BC Managed Care – PPO

## 2019-08-17 DIAGNOSIS — I2510 Atherosclerotic heart disease of native coronary artery without angina pectoris: Secondary | ICD-10-CM | POA: Diagnosis not present

## 2019-08-17 DIAGNOSIS — I10 Essential (primary) hypertension: Secondary | ICD-10-CM | POA: Insufficient documentation

## 2019-08-17 DIAGNOSIS — Z951 Presence of aortocoronary bypass graft: Secondary | ICD-10-CM | POA: Diagnosis not present

## 2019-08-17 DIAGNOSIS — Z7982 Long term (current) use of aspirin: Secondary | ICD-10-CM | POA: Diagnosis not present

## 2019-08-17 DIAGNOSIS — Z87891 Personal history of nicotine dependence: Secondary | ICD-10-CM | POA: Insufficient documentation

## 2019-08-17 DIAGNOSIS — E119 Type 2 diabetes mellitus without complications: Secondary | ICD-10-CM | POA: Insufficient documentation

## 2019-08-17 DIAGNOSIS — U071 COVID-19: Secondary | ICD-10-CM | POA: Diagnosis not present

## 2019-08-17 DIAGNOSIS — R0602 Shortness of breath: Secondary | ICD-10-CM | POA: Diagnosis not present

## 2019-08-17 DIAGNOSIS — R509 Fever, unspecified: Secondary | ICD-10-CM | POA: Diagnosis present

## 2019-08-17 LAB — CBC WITH DIFFERENTIAL/PLATELET
Abs Immature Granulocytes: 0.02 10*3/uL (ref 0.00–0.07)
Basophils Absolute: 0 10*3/uL (ref 0.0–0.1)
Basophils Relative: 0 %
Eosinophils Absolute: 0 10*3/uL (ref 0.0–0.5)
Eosinophils Relative: 0 %
HCT: 46.7 % (ref 39.0–52.0)
Hemoglobin: 15.5 g/dL (ref 13.0–17.0)
Immature Granulocytes: 0 %
Lymphocytes Relative: 19 %
Lymphs Abs: 0.9 10*3/uL (ref 0.7–4.0)
MCH: 31.4 pg (ref 26.0–34.0)
MCHC: 33.2 g/dL (ref 30.0–36.0)
MCV: 94.5 fL (ref 80.0–100.0)
Monocytes Absolute: 0.4 10*3/uL (ref 0.1–1.0)
Monocytes Relative: 8 %
Neutro Abs: 3.4 10*3/uL (ref 1.7–7.7)
Neutrophils Relative %: 73 %
Platelets: 148 10*3/uL — ABNORMAL LOW (ref 150–400)
RBC: 4.94 MIL/uL (ref 4.22–5.81)
RDW: 13.2 % (ref 11.5–15.5)
WBC: 4.7 10*3/uL (ref 4.0–10.5)
nRBC: 0 % (ref 0.0–0.2)

## 2019-08-17 LAB — COMPREHENSIVE METABOLIC PANEL
ALT: 33 U/L (ref 0–44)
AST: 58 U/L — ABNORMAL HIGH (ref 15–41)
Albumin: 3.6 g/dL (ref 3.5–5.0)
Alkaline Phosphatase: 64 U/L (ref 38–126)
Anion gap: 17 — ABNORMAL HIGH (ref 5–15)
BUN: 24 mg/dL — ABNORMAL HIGH (ref 8–23)
CO2: 21 mmol/L — ABNORMAL LOW (ref 22–32)
Calcium: 8.1 mg/dL — ABNORMAL LOW (ref 8.9–10.3)
Chloride: 94 mmol/L — ABNORMAL LOW (ref 98–111)
Creatinine, Ser: 1.04 mg/dL (ref 0.61–1.24)
GFR calc Af Amer: >60 mL/min (ref >60)
GFR calc non Af Amer: >60 mL/min (ref >60)
Glucose, Bld: 238 mg/dL — ABNORMAL HIGH (ref 70–99)
Potassium: 3.9 mmol/L (ref 3.5–5.1)
Sodium: 132 mmol/L — ABNORMAL LOW (ref 135–145)
Total Bilirubin: 1.3 mg/dL — ABNORMAL HIGH (ref 0.3–1.2)
Total Protein: 7.6 g/dL (ref 6.5–8.1)

## 2019-08-17 LAB — PROCALCITONIN: Procalcitonin: 0.18 ng/mL

## 2019-08-17 LAB — BLOOD GAS, VENOUS
Acid-Base Excess: 0.3 mmol/L (ref 0.0–2.0)
Bicarbonate: 22.9 mmol/L (ref 20.0–28.0)
FIO2: 21
O2 Saturation: 26.6 %
Patient temperature: 37.6
pCO2, Ven: 39.9 mmHg — ABNORMAL LOW (ref 44.0–60.0)
pH, Ven: 7.404 (ref 7.250–7.430)
pO2, Ven: <31 mmHg — CL (ref 32.0–45.0)

## 2019-08-17 LAB — URINALYSIS, ROUTINE W REFLEX MICROSCOPIC
Bacteria, UA: NONE SEEN
Bilirubin Urine: NEGATIVE
Glucose, UA: >=500 mg/dL — AB
Ketones, ur: 80 mg/dL — AB
Leukocytes,Ua: NEGATIVE
Nitrite: NEGATIVE
Protein, ur: 100 mg/dL — AB
Specific Gravity, Urine: 1.032 — ABNORMAL HIGH (ref 1.005–1.030)
pH: 5 (ref 5.0–8.0)

## 2019-08-17 LAB — TRIGLYCERIDES: Triglycerides: 102 mg/dL (ref <150)

## 2019-08-17 LAB — LACTATE DEHYDROGENASE: LDH: 228 U/L — ABNORMAL HIGH (ref 98–192)

## 2019-08-17 LAB — FERRITIN: Ferritin: 586 ng/mL — ABNORMAL HIGH (ref 24–336)

## 2019-08-17 LAB — FIBRINOGEN: Fibrinogen: 693 mg/dL — ABNORMAL HIGH (ref 210–475)

## 2019-08-17 LAB — POC SARS CORONAVIRUS 2 AG -  ED: SARS Coronavirus 2 Ag: POSITIVE — AB

## 2019-08-17 LAB — C-REACTIVE PROTEIN: CRP: 8.3 mg/dL — ABNORMAL HIGH (ref <1.0)

## 2019-08-17 LAB — CBG MONITORING, ED: Glucose-Capillary: 218 mg/dL — ABNORMAL HIGH (ref 70–99)

## 2019-08-17 LAB — D-DIMER, QUANTITATIVE: D-Dimer, Quant: 1.08 ug/mL-FEU — ABNORMAL HIGH (ref 0.00–0.50)

## 2019-08-17 LAB — LACTIC ACID, PLASMA
Lactic Acid, Venous: 2.5 mmol/L (ref 0.5–1.9)
Lactic Acid, Venous: 2.2 mmol/L (ref 0.5–1.9)

## 2019-08-17 MED ORDER — SODIUM CHLORIDE 0.9 % IV SOLN: Freq: Once | INTRAVENOUS | Status: DC

## 2019-08-17 MED ORDER — BENZONATATE 100 MG PO CAPS: 100.0000 mg | ORAL_CAPSULE | Freq: Three times a day (TID) | ORAL | 0 refills | Status: DC

## 2019-08-17 MED ORDER — ACETAMINOPHEN 325 MG PO TABS
650.0000 mg | ORAL_TABLET | Freq: Once | ORAL | Status: AC
  Administered 2019-08-17: 650 mg via ORAL
  Filled 2019-08-17: qty 2

## 2019-08-17 MED ORDER — ONDANSETRON 4 MG PO TBDP: 4.0000 mg | ORAL_TABLET | Freq: Three times a day (TID) | ORAL | 0 refills | Status: AC | PRN

## 2019-08-17 MED ORDER — SODIUM CHLORIDE 0.9 % IV BOLUS
1000.0000 mL | Freq: Once | INTRAVENOUS | Status: AC
  Administered 2019-08-17: 1000 mL via INTRAVENOUS

## 2019-08-17 NOTE — ED Notes (Signed)
Date and time results received: 08/17/19 5:52 PM  (use smartphrase ".now" to insert current time)  Test: Lactic Critical Value: 2.5  Name of Provider Notified: [facility]  Orders Received? Or Actions Taken?: Orders Received - See Orders for details

## 2019-08-17 NOTE — ED Triage Notes (Signed)
Pt presents to ED with complaints of generalized weakness, headache, cough, body aches, and chills started Wednesday. Pt unsure if fever at home.

## 2019-08-17 NOTE — Discharge Instructions (Addendum)
As discussed, your COVID test was positive. Continue to self quarantine 10 days from your first symptom. You must be fever free for 24 hours prior to the end of your quarantine. I am sending you home with cough medication and nausea medication. Take as prescribed and use as needed. You may take over the counter tylenol as needed for headaches and fever. Follow-up with your PCP if your symptoms do not improve within the next week. Return to the ER if you develop worsening shortness of breath, central chest pain, or any worsening symptoms.

## 2019-08-17 NOTE — ED Provider Notes (Signed)
[facility] EMERGENCY DEPARTMENT Provider Note   CSN: [id number] Arrival date & time: 08/17/19  1524     History Chief Complaint  Patient presents with  . Fever    Covid positive    [name] [name] is a 63 y.o. male with a past medical history significant for CAD status post bypass grafting in 1996 and SVG was stented in 2006, hyperlipidemia, hypertension, right bundle branch block, sleep apnea on CPAP machine who presents to the ED due to gradual onset of progressively worsening COVID-like symptoms.  Patient admits to generalized weakness, headache, dry cough, myalgias, and chills that first began on Sunday afternoon.  Patient notes he started to feel better Monday and Tuesday, but progressively got worse starting on Wednesday night.  Patient admits to a dry cough associated with a sore throat.  Patient has tried Alka-Seltzer plus and Tylenol with mild relief.  Patient admits to shortness of breath when standing and with ambulation.  Patient denies chest pain, lower extremity edema, abdominal pain, nausea, vomiting, diarrhea.  Patient denies Covid exposures and sick contacts.   Past Medical History:  Diagnosis Date  . Chest pain        . Coronary artery disease    post bypass grafting in 1996-.SVG was stented  to his ramus branch in 2006     . Diabetes mellitus without complication (HCC)    noninsulin -requiring diabetes   . Dyspnea    MYOVIEW STRESS TEST--PERFORMED 04/30/2009--WHICH SHOWED  NEW LATERAL ISCHEMIA.   Marland Kitchen Hyperlipidemia   . Hypertension   . Right bundle branch block   . Sleep apnea    pt has a cpap machine    Patient Active Problem List   Diagnosis Date Noted  . Helicobacter pylori gastritis 10/19/2015  . Schatzki's ring   . Hiatal hernia   . Mucosal abnormality of stomach   . Mucosal abnormality of esophagus   . Food impaction of esophagus 01/30/2015  . CAD, CABG '96. SVG-RI PCI '06, 1/11. Cath 4/13 with ISR- medical Rx only 01/22/2013  . HTN (hypertension)  01/22/2013  . Dyslipidemia 01/22/2013  . Diabetes mellitus, Type 2 NIDDM 01/22/2013  . Sleep apnea 01/22/2013  . RBBB 01/22/2013  . Obesity (BMI 30.0-34.9) 01/22/2013  . GERD 05/14/2009  . ESOPHAGEAL STRICTURE 05/12/2009  . SCHATZKI'S RING, HX OF 05/12/2009    Past Surgical History:  Procedure Laterality Date  . ADULT ECHOCARDIOGRAPHY  11/08/2004   The left ventricle is normal size .There is normal left ventricular wall thickness .EJECTION FRACTION =>55%.The LA is mildly dilated .The main PA is imaged to the bicfucation into right and left branches with normal size noted.Based on an assumed RA pressure orf 10 and a end diastolic PR gradient of 1.2 m/sec the PA end diastolic pressure is estimated to be ~16 which is top normal  . CARDIAC CATHETERIZATION  01 /21/2011    REVEALING 75 % IN - STENT RESTENOSIS WITHIN  THE STENTED SEGMENT WHICH I RESTENTED WITH A DRUG ELUTING STENT   . CARDIAC CATHETERIZATION  04/ 24 /2013   THAT SHOWED 95 % IN STENT RESTENOSIS WITHIN THE PREVIOUSLY RESTENTED SEGMENT . iI ELECTED TO TREAT HIM MEDICALLY/ PT ASYMPTOMATIC SINCE  . CARDIOVASCULAR STRESS TEST  SEPT 10 2010   NEW LATERAL ISCHEMIA  . CARDIOVASCULAR STRESS TEST  November 01 2009   SHOWED RESOULTION OF HIS ISCHEMIA ABNORMALITY. PT WAS ASYMPTOMATIC  AFTER THAT  . CARDIOVASCULAR STRESS TEST  December 08 2011   Decker  ISCHEMIA (LED TO A CATH)  . Carotid Doppler  11 /23/2013   Summay -Bilateral Bulb ICA's demonstrated a mild amount of fibrous plaquewith no evidence of significant diameter  reduction ,tortuosity or any other vascular abnormality. This is midly abnormal carotid duple doppler eval. When compared to the previous study dated 08/12/2007,there are no significant changes noted.Noted it is recommend that this test be repeated when clinically indicated aque   . COLONOSCOPY  11/2007   Dr. Jena Gauss: normal. repeat 11/2017.  Marland Kitchen ESOPHAGEAL DILATION N/A 04/16/2015   Procedure: ESOPHAGEAL  DILATION;  Surgeon: Corbin Ade, MD;  Location: [facility];  Service: Endoscopy;  Laterality: N/A;  . ESOPHAGOGASTRODUODENOSCOPY N/A 01/30/2015   Dr. Charlott Rakes: food impaction-foreign body removal, distal esophageal stricture, ulcerated esophagus due to food impaction.   . ESOPHAGOGASTRODUODENOSCOPY  11/2007   Dr. Jena Gauss: Schatzki ring s/p dilation, small hh  . ESOPHAGOGASTRODUODENOSCOPY  10/2007   Dr. Carman Ching: food impaction, friable esophageal ring  . ESOPHAGOGASTRODUODENOSCOPY N/A 04/16/2015   RMR: Distal tandem esophageal ring status pos Maloney dilation. Abnormal distal esophagus. Status post esophageal biopsy. Hiatal hernia. Abnormal gastric mucosa of uncertain significance Status post gastric biopsy.  Marland Kitchen LEFT HEART CATHETERIZATION WITH CORONARY ANGIOGRAM N/A 12/13/2011   Procedure: LEFT HEART CATHETERIZATION WITH CORONARY ANGIOGRAM;  Surgeon: Runell Gess, MD;  Location: [facility] CATH LAB;  Service: Cardiovascular;  Laterality: N/A;  . sleep  study report  05/03/2007   The patient has undergone a diagnostic polysomnogram which confirmed the diagnosis of sleep disordered breathing with an AHI of 5.4/hr and 24.91/hr during REM sleep  . SLEEP STUDY  07/ 06/2007   The (AHI) formerly known as RDI , during the total sleep time  6 h 17 min  was 5.41/ hr and during Rem sleep at 24.91/hr .This places the patient into the mild sleep apnea category . The average oxygen saturation range   . SLEEP STUDY  07 11 2008   (cont-) range during REM was 91 % The lowest oxygen saturation during NON-REM and REM sleep was 86% and 87 % respectively . The patient was obsered snoring loudly       Family History  Problem Relation Age of Onset  . Diabetes Mother   . Diabetes Brother   . Diabetes Sister   . Colon cancer Neg Hx     Social History   Tobacco Use  . Smoking status: Former Smoker    Quit date: 02/19/1995    Years since quitting: 24.5  . Smokeless tobacco: Never Used  . Tobacco  comment: Quit x 20 years  Substance Use Topics  . Alcohol use: No    Alcohol/week: 0.0 standard drinks  . Drug use: No    Home Medications Prior to Admission medications   Medication Sig Start Date End Date Taking? Authorizing Provider  acetaminophen (TYLENOL 8 HOUR ARTHRITIS PAIN) 650 MG CR tablet Take 650 mg by mouth every 8 (eight) hours as needed for pain or fever.   Yes [provider]  aspirin EC 81 MG tablet Take 81 mg by mouth 2 (two) times daily.    Yes [provider]  buPROPion (WELLBUTRIN SR) 100 MG 12 hr tablet Take 100 mg by mouth 2 (two) times daily.  03/07/19  Yes [provider]  Canagliflozin-metFORMIN HCl (INVOKAMET) [phone] MG TABS Take 1 tablet by mouth 2 (two) times daily.  04/17/19  Yes [provider]  celecoxib (CELEBREX) 200 MG capsule Take 1 capsule (200 mg total) by  mouth daily. With food 05/20/19  Yes Stacks, Broadus JohnWarren, MD  Chlorphen-Phenyleph-ASA (ALKA-SELTZER PLUS COLD) 2-7.8-325 MG TBEF Take 1 tablet by mouth daily as needed (for cold symptoms).   Yes [provider]  clopidogrel (PLAVIX) 75 MG tablet Take 75 mg by mouth daily.   Yes [provider]  escitalopram (LEXAPRO) 20 MG tablet Take 20 mg by mouth at bedtime.    Yes [provider]  famotidine (PEPCID) 20 MG tablet Take 1 tablet (20 mg total) by mouth 2 (two) times daily. Patient taking differently: Take 20 mg by mouth daily.  05/20/19  Yes Mechele ClaudeStacks, Warren, MD  fish oil-omega-3 fatty acids 1000 MG capsule Take 1 g by mouth daily.    Yes [provider]  Insulin Degludec 200 UNIT/ML SOPN Inject 30 Units into the skin daily. Patient taking differently: Inject 30 Units into the skin every morning.  08/11/19  Yes Mechele ClaudeStacks, Warren, MD  losartan (COZAAR) 100 MG tablet Take 100 mg by mouth every evening.  11/28/12  Yes [provider]  metoprolol succinate (TOPROL-XL) 25 MG 24 hr tablet Take 25 mg by mouth at bedtime.    Yes [provider]  Multiple Vitamin (MULITIVITAMIN WITH MINERALS) TABS Take 1 tablet by mouth daily.   Yes [provider]  predniSONE (DELTASONE) 10 MG tablet Take 5 daily for 2 days followed by 4,3,2 and 1 for 2 days each. Patient taking differently: Take 10 mg by mouth See admin instructions. Take 5 daily for 2 days followed by 4,3,2 and 1 for 2 days each. 08/11/19  Yes Mechele ClaudeStacks, Warren, MD  rosuvastatin (CRESTOR) 10 MG tablet Take 1 tablet (10 mg total) by mouth daily. For cholesterol 05/20/19  Yes Stacks, Broadus JohnWarren, MD  Semaglutide, 1 MG/DOSE, (OZEMPIC, 1 MG/DOSE,) 2 MG/1.5ML SOPN Inject 1 mg into the skin every Monday.  12/11/18  Yes [provider]  Vitamin D, Ergocalciferol, (DRISDOL) 1.25 MG (50000 UT) CAPS capsule Take 1 capsule (50,000 Units total) by mouth every 7 (seven) days. 05/22/19 05/20/20 Yes Stacks, Broadus JohnWarren, MD  benzonatate (TESSALON) 100 MG capsule Take 1 capsule (100 mg total) by mouth every 8 (eight) hours. 08/17/19   Mannie StabileAberman, Vanna Shavers C, PA-C  ondansetron (ZOFRAN ODT) 4 MG disintegrating tablet Take 1 tablet (4 mg total) by mouth every 8 (eight) hours as needed for nausea or vomiting. 08/17/19   Mannie StabileAberman, Shewanda Sharpe C, PA-C    Allergies    Hydrocodone-acetaminophen and Hydrochlorothiazide  Review of Systems   Review of Systems  Constitutional: Positive for activity change, chills and fatigue. Negative for fever.  Respiratory: Positive for cough and shortness of breath.   Cardiovascular: Negative for chest pain, palpitations and leg swelling.  Gastrointestinal: Negative for abdominal pain, diarrhea, nausea and vomiting.  Genitourinary: Negative for dysuria.  Musculoskeletal: Positive for arthralgias and myalgias.  Neurological: Positive for weakness and headaches. Negative for dizziness and numbness.    Physical Exam Updated Vital Signs BP 129/68   Pulse 81   Temp 99.6 F (37.6 C) (Oral)   Resp (!) 33   Ht 5\' 10"  (1.778 m)   Wt 91.2 kg   SpO2 94%   BMI  28.84 kg/m   Physical Exam Vitals and nursing note reviewed.  Constitutional:      General: He is not in acute distress.    Appearance: He is ill-appearing.  HENT:     Head: Normocephalic.     Mouth/Throat:     Mouth: Mucous membranes are dry.  Pharynx: Posterior oropharyngeal erythema present. No oropharyngeal exudate.     Comments: Very dry mucus membranes. Extremely erythematous posterior oropharyngeal with no exudates. Uvula midline. Normal phonation Eyes:     Pupils: Pupils are equal, round, and reactive to light.  Neck:     Comments: No meningismus. Mild tenderness in posterior aspect of neck Cardiovascular:     Rate and Rhythm: Normal rate and regular rhythm.     Pulses: Normal pulses.     Heart sounds: Normal heart sounds. No murmur. No friction rub. No gallop.   Pulmonary:     Breath sounds: Normal breath sounds.     Comments: Increased work of breathing, but no accessory muscle usage. Ambulated and dropped down to 94%, but felts very lightheaded and short of breath. Speaking in full sentences. Lungs clear to auscultation bilaterally with slight decreased air movement at bases Abdominal:     General: Abdomen is flat. There is no distension.     Palpations: Abdomen is soft.     Tenderness: There is no abdominal tenderness. There is no guarding or rebound.  Musculoskeletal:     Cervical back: Normal range of motion and neck supple. No rigidity.     Right lower leg: No edema.     Left lower leg: No edema.     Comments: Able to move all 4 extremities without difficulty  Skin:    General: Skin is warm.  Neurological:     General: No focal deficit present.     Mental Status: He is alert.     ED Results / Procedures / Treatments   Labs (all labs ordered are listed, but only abnormal results are displayed) Labs Reviewed  LACTIC ACID, PLASMA - Abnormal; Notable for the following components:      Result Value   Lactic Acid, Venous 2.5 (*)    All other components  within normal limits  LACTIC ACID, PLASMA - Abnormal; Notable for the following components:   Lactic Acid, Venous 2.2 (*)    All other components within normal limits  CBC WITH DIFFERENTIAL/PLATELET - Abnormal; Notable for the following components:   Platelets 148 (*)    All other components within normal limits  COMPREHENSIVE METABOLIC PANEL - Abnormal; Notable for the following components:   Sodium 132 (*)    Chloride 94 (*)    CO2 21 (*)    Glucose, Bld 238 (*)    BUN 24 (*)    Calcium 8.1 (*)    AST 58 (*)    Total Bilirubin 1.3 (*)    Anion gap 17 (*)    All other components within normal limits  D-DIMER, QUANTITATIVE (NOT AT [facility]) - Abnormal; Notable for the following components:   D-Dimer, Quant 1.08 (*)    All other components within normal limits  LACTATE DEHYDROGENASE - Abnormal; Notable for the following components:   LDH 228 (*)    All other components within normal limits  FERRITIN - Abnormal; Notable for the following components:   Ferritin 586 (*)    All other components within normal limits  FIBRINOGEN - Abnormal; Notable for the following components:   Fibrinogen 693 (*)    All other components within normal limits  C-REACTIVE PROTEIN - Abnormal; Notable for the following components:   CRP 8.3 (*)    All other components within normal limits  URINALYSIS, ROUTINE W REFLEX MICROSCOPIC - Abnormal; Notable for the following components:   Specific Gravity, Urine 1.032 (*)    Glucose, UA >=  500 (*)    Hgb urine dipstick SMALL (*)    Ketones, ur 80 (*)    Protein, ur 100 (*)    All other components within normal limits  BLOOD GAS, VENOUS - Abnormal; Notable for the following components:   pCO2, Ven 39.9 (*)    pO2, Ven <31.0 (*)    All other components within normal limits  POC SARS CORONAVIRUS 2 AG -  ED - Abnormal; Notable for the following components:   SARS Coronavirus 2 Ag POSITIVE (*)    All other components within normal limits  CBG MONITORING, ED -  Abnormal; Notable for the following components:   Glucose-Capillary 218 (*)    All other components within normal limits  CULTURE, BLOOD (ROUTINE X 2)  CULTURE, BLOOD (ROUTINE X 2)  PROCALCITONIN  TRIGLYCERIDES    EKG EKG Interpretation  Date/Time:   year old male presents to the ED for evaluation of Covid-like symptoms of generalized weakness, headache, cough, body aches, and chills.  Upon arrival patient borderline febrile at 99.6, tachycardic at 108 with blood pressure of 154/67.  O2 saturation at 92% on room air.  On physical exam patient is tachypneic at 23 with increased work of breathing, no accessory muscle usage.  Patient speaking in full sentences.  Lungs clear to auscultation bilaterally with slight decreased air movement at the base of the lungs.  Throat  extremely erythematous with uvula midline and dry mucous membranes.  Patient ambulated in the room and maintained O2 saturation at 94%; however, felt  short of breath and lightheaded upon ambulation likely due to dehydration.  Patient's rapid Covid test is positive.   CXR personally reviewed which demonstrates mild vascular congestion and minimal hazy density in mid to lower lung fields that are most suggestive of a combination of vascular congestion and atelectasis.  CBC reassuring with no leukocytosis, but significant for mild thrombocytopenia at 148. CMP significant for hyperglycemia at 238, elevated AST at 17, hyponatremia at 132, decreased bicarb at 21 with anion gap of 17 likely due to increased lactic acid at 2.5. Doubt DKA at this time given glucose level, but will obtain UA and VBG for further evaluation.   UA significant for small amount of hgb, ketonuria, and proteinuria. Suspect ketonuria due to dehydration rather than DKA given  that his glucose is not greater than 250 and VBG with normal pH. Inflammatory marks elevated: LDH at 228, D-dimer at 1.08, and Fibrinogen at 693. Procalcitonin normal at 0.18 doubt bacterial infection at this time. 2nd lactic acid has improved to 2.2. Discussed case with Dr. Estell Harpin who evaluated patient at bedside and agrees patient is stable enough for discharge. Will treat symptomatically with cough medication and Zofran. Patient educated on self quarantine protocol. Patient has been advised to follow-up with PCP if symptoms do not improve within the next week. Strict ED precautions discussed with patient. Patient states understanding and agrees to plan. Patient discharged home in no acute distress and stable vitals   [name] [name] was evaluated in Emergency Department on 08/17/2019 for the symptoms described in the history of present illness. He was evaluated in the context of the global COVID-19 pandemic, which necessitated consideration that the patient might be at risk for infection with the SARS-CoV-2 virus that causes COVID-19. Institutional protocols and algorithms that pertain to the evaluation of patients at risk for COVID-19 are in a state of rapid change based on information released by regulatory bodies including the CDC and federal and state organizations. These policies and algorithms were followed during the patient's care in the ED. Final Clinical Impression(s) / ED Diagnoses Final diagnoses:  COVID-19 virus infection    Rx / DC Orders ED Discharge Orders         Ordered    benzonatate (TESSALON) 100 MG capsule  Every 8 hours     08/17/19 1956    ondansetron (ZOFRAN ODT) 4 MG disintegrating tablet  Every 8 hours PRN     08/17/19 1956           Jesusita Oka 08/17/19 2044    Bethann Berkshire, MD 08/17/19 2213

## 2019-08-18 ENCOUNTER — Telehealth: Payer: Self-pay | Admitting: Infectious Diseases

## 2019-08-18 NOTE — Telephone Encounter (Signed)
Called to discuss with patient about Covid symptoms and the use of bamlanivimab, a monoclonal antibody infusion for those with mild to moderate Covid symptoms and at a high risk of hospitalization.  Pt is qualified for this infusion at the [facility] infusion center due to Age >55, Diabetes and Hypertension.   Sx started Sunday 12/20 and went to ER yesterday for worsening of symptoms. Not hypoxic. Inflammatory markers mildly elevated.  Will call back to see if he is interested in infusion appt Wednesday (day 10).    Message left to call back.

## 2019-08-18 NOTE — Telephone Encounter (Signed)
Called to discuss with patient about Covid symptoms and the use of bamlanivimab, a monoclonal antibody infusion for those with mild to moderate Covid symptoms and at a high risk of hospitalization.  Pt is qualified for this infusion at the [facility] infusion center due to Age >55 and Hypertension, Diabetes.   Symptoms he feels started 12/20 when he felt onset of fatigue and body aches that evening on Sunday. Went to the MD Monday AM for a follow up visit. He was given pulse dose of steroids for his shoulder pain and was feeling well the next two days until Wednesday night when he had severe weakness, polyarthralgia's, fatigue and fever. His oxygen levels in the ER last night was in the mid 90s. He has a mild cough that is new since this illness started.   He will think about it today and call back to the call line or wait for my call tomorrow mid morning to follow up. Transportation is a limiting factor for him.

## 2019-08-19 ENCOUNTER — Telehealth: Payer: Self-pay | Admitting: Family Medicine

## 2019-08-19 ENCOUNTER — Telehealth: Payer: Self-pay | Admitting: *Deleted

## 2019-08-19 NOTE — Telephone Encounter (Signed)
Fax from [facility] Clarification for Prednisonte 10 mg on directions, quantity and days supply

## 2019-08-19 NOTE — Telephone Encounter (Signed)
Patient 's wife aware and verbalized understanding. °

## 2019-08-19 NOTE — Telephone Encounter (Signed)
It looks like the prescription was resent to the drug store in [place] instead of mail order pharmacy.

## 2019-08-19 NOTE — Telephone Encounter (Signed)
Would like to proceed with infusion if there is a spot on 12/30. This will be his 10th day and last that we can offer.

## 2019-08-19 NOTE — Telephone Encounter (Signed)
It is symptom management as COVID-19 is a virus. Make sure he stays well hydrated - small sips every little bit. Continue zinc. Add vitamin C.

## 2019-08-19 NOTE — Telephone Encounter (Signed)
Patient symptoms he is weak and not eating. Patient is taking zinc. He cant get to the infusion clinic cause no one will take him since he has the Virus and wife does not drive. She is very concerned what else can they do to help him?

## 2019-08-20 ENCOUNTER — Inpatient Hospital Stay (HOSPITAL_COMMUNITY)
Admission: EM | Admit: 2019-08-20 | Discharge: 2019-09-22 | DRG: 870 | Disposition: E | Payer: BC Managed Care – PPO | Attending: Pulmonary Disease | Admitting: Pulmonary Disease

## 2019-08-20 ENCOUNTER — Telehealth: Payer: Self-pay | Admitting: Family Medicine

## 2019-08-20 ENCOUNTER — Other Ambulatory Visit: Payer: Self-pay

## 2019-08-20 ENCOUNTER — Emergency Department (HOSPITAL_COMMUNITY): Payer: BC Managed Care – PPO

## 2019-08-20 ENCOUNTER — Encounter (HOSPITAL_COMMUNITY): Payer: Self-pay

## 2019-08-20 DIAGNOSIS — Z6829 Body mass index (BMI) 29.0-29.9, adult: Secondary | ICD-10-CM

## 2019-08-20 DIAGNOSIS — K449 Diaphragmatic hernia without obstruction or gangrene: Secondary | ICD-10-CM | POA: Diagnosis not present

## 2019-08-20 DIAGNOSIS — Z888 Allergy status to other drugs, medicaments and biological substances status: Secondary | ICD-10-CM

## 2019-08-20 DIAGNOSIS — E870 Hyperosmolality and hypernatremia: Secondary | ICD-10-CM | POA: Diagnosis not present

## 2019-08-20 DIAGNOSIS — E11649 Type 2 diabetes mellitus with hypoglycemia without coma: Secondary | ICD-10-CM | POA: Diagnosis not present

## 2019-08-20 DIAGNOSIS — R6521 Severe sepsis with septic shock: Secondary | ICD-10-CM | POA: Diagnosis not present

## 2019-08-20 DIAGNOSIS — I4510 Unspecified right bundle-branch block: Secondary | ICD-10-CM | POA: Diagnosis present

## 2019-08-20 DIAGNOSIS — J9690 Respiratory failure, unspecified, unspecified whether with hypoxia or hypercapnia: Secondary | ICD-10-CM | POA: Diagnosis not present

## 2019-08-20 DIAGNOSIS — Z794 Long term (current) use of insulin: Secondary | ICD-10-CM

## 2019-08-20 DIAGNOSIS — R0602 Shortness of breath: Secondary | ICD-10-CM | POA: Diagnosis not present

## 2019-08-20 DIAGNOSIS — I2510 Atherosclerotic heart disease of native coronary artery without angina pectoris: Secondary | ICD-10-CM | POA: Diagnosis present

## 2019-08-20 DIAGNOSIS — J181 Lobar pneumonia, unspecified organism: Secondary | ICD-10-CM | POA: Diagnosis not present

## 2019-08-20 DIAGNOSIS — F329 Major depressive disorder, single episode, unspecified: Secondary | ICD-10-CM | POA: Diagnosis present

## 2019-08-20 DIAGNOSIS — Z66 Do not resuscitate: Secondary | ICD-10-CM | POA: Diagnosis not present

## 2019-08-20 DIAGNOSIS — K5900 Constipation, unspecified: Secondary | ICD-10-CM | POA: Diagnosis present

## 2019-08-20 DIAGNOSIS — Z4682 Encounter for fitting and adjustment of non-vascular catheter: Secondary | ICD-10-CM | POA: Diagnosis not present

## 2019-08-20 DIAGNOSIS — E875 Hyperkalemia: Secondary | ICD-10-CM | POA: Diagnosis present

## 2019-08-20 DIAGNOSIS — Z452 Encounter for adjustment and management of vascular access device: Secondary | ICD-10-CM | POA: Diagnosis not present

## 2019-08-20 DIAGNOSIS — J80 Acute respiratory distress syndrome: Secondary | ICD-10-CM | POA: Diagnosis not present

## 2019-08-20 DIAGNOSIS — I10 Essential (primary) hypertension: Secondary | ICD-10-CM | POA: Diagnosis present

## 2019-08-20 DIAGNOSIS — E669 Obesity, unspecified: Secondary | ICD-10-CM | POA: Diagnosis not present

## 2019-08-20 DIAGNOSIS — E119 Type 2 diabetes mellitus without complications: Secondary | ICD-10-CM | POA: Diagnosis not present

## 2019-08-20 DIAGNOSIS — M7989 Other specified soft tissue disorders: Secondary | ICD-10-CM | POA: Diagnosis not present

## 2019-08-20 DIAGNOSIS — N179 Acute kidney failure, unspecified: Secondary | ICD-10-CM | POA: Diagnosis not present

## 2019-08-20 DIAGNOSIS — E871 Hypo-osmolality and hyponatremia: Secondary | ICD-10-CM | POA: Diagnosis not present

## 2019-08-20 DIAGNOSIS — Z79899 Other long term (current) drug therapy: Secondary | ICD-10-CM

## 2019-08-20 DIAGNOSIS — Z4659 Encounter for fitting and adjustment of other gastrointestinal appliance and device: Secondary | ICD-10-CM

## 2019-08-20 DIAGNOSIS — R4182 Altered mental status, unspecified: Secondary | ICD-10-CM | POA: Diagnosis not present

## 2019-08-20 DIAGNOSIS — I4891 Unspecified atrial fibrillation: Secondary | ICD-10-CM | POA: Diagnosis not present

## 2019-08-20 DIAGNOSIS — E1165 Type 2 diabetes mellitus with hyperglycemia: Secondary | ICD-10-CM | POA: Diagnosis not present

## 2019-08-20 DIAGNOSIS — R918 Other nonspecific abnormal finding of lung field: Secondary | ICD-10-CM | POA: Diagnosis not present

## 2019-08-20 DIAGNOSIS — R32 Unspecified urinary incontinence: Secondary | ICD-10-CM | POA: Diagnosis not present

## 2019-08-20 DIAGNOSIS — R001 Bradycardia, unspecified: Secondary | ICD-10-CM | POA: Diagnosis not present

## 2019-08-20 DIAGNOSIS — J9601 Acute respiratory failure with hypoxia: Secondary | ICD-10-CM | POA: Diagnosis not present

## 2019-08-20 DIAGNOSIS — R7401 Elevation of levels of liver transaminase levels: Secondary | ICD-10-CM | POA: Diagnosis present

## 2019-08-20 DIAGNOSIS — G4733 Obstructive sleep apnea (adult) (pediatric): Secondary | ICD-10-CM | POA: Diagnosis not present

## 2019-08-20 DIAGNOSIS — R509 Fever, unspecified: Secondary | ICD-10-CM | POA: Diagnosis not present

## 2019-08-20 DIAGNOSIS — A4189 Other specified sepsis: Principal | ICD-10-CM | POA: Diagnosis present

## 2019-08-20 DIAGNOSIS — D6489 Other specified anemias: Secondary | ICD-10-CM | POA: Diagnosis present

## 2019-08-20 DIAGNOSIS — K219 Gastro-esophageal reflux disease without esophagitis: Secondary | ICD-10-CM | POA: Diagnosis not present

## 2019-08-20 DIAGNOSIS — Z7902 Long term (current) use of antithrombotics/antiplatelets: Secondary | ICD-10-CM

## 2019-08-20 DIAGNOSIS — G92 Toxic encephalopathy: Secondary | ICD-10-CM | POA: Diagnosis not present

## 2019-08-20 DIAGNOSIS — E785 Hyperlipidemia, unspecified: Secondary | ICD-10-CM | POA: Diagnosis not present

## 2019-08-20 DIAGNOSIS — Z833 Family history of diabetes mellitus: Secondary | ICD-10-CM

## 2019-08-20 DIAGNOSIS — G9341 Metabolic encephalopathy: Secondary | ICD-10-CM | POA: Diagnosis not present

## 2019-08-20 DIAGNOSIS — Z951 Presence of aortocoronary bypass graft: Secondary | ICD-10-CM

## 2019-08-20 DIAGNOSIS — R52 Pain, unspecified: Secondary | ICD-10-CM | POA: Diagnosis not present

## 2019-08-20 DIAGNOSIS — J1282 Pneumonia due to coronavirus disease 2019: Secondary | ICD-10-CM | POA: Diagnosis present

## 2019-08-20 DIAGNOSIS — Z87891 Personal history of nicotine dependence: Secondary | ICD-10-CM

## 2019-08-20 DIAGNOSIS — U071 COVID-19: Secondary | ICD-10-CM

## 2019-08-20 DIAGNOSIS — Z7982 Long term (current) use of aspirin: Secondary | ICD-10-CM

## 2019-08-20 DIAGNOSIS — L89150 Pressure ulcer of sacral region, unstageable: Secondary | ICD-10-CM | POA: Diagnosis present

## 2019-08-20 DIAGNOSIS — L8990 Pressure ulcer of unspecified site, unspecified stage: Secondary | ICD-10-CM | POA: Insufficient documentation

## 2019-08-20 DIAGNOSIS — Z978 Presence of other specified devices: Secondary | ICD-10-CM

## 2019-08-20 DIAGNOSIS — N170 Acute kidney failure with tubular necrosis: Secondary | ICD-10-CM | POA: Diagnosis not present

## 2019-08-20 DIAGNOSIS — R0902 Hypoxemia: Secondary | ICD-10-CM | POA: Diagnosis not present

## 2019-08-20 DIAGNOSIS — Z885 Allergy status to narcotic agent status: Secondary | ICD-10-CM

## 2019-08-20 DIAGNOSIS — I462 Cardiac arrest due to underlying cardiac condition: Secondary | ICD-10-CM | POA: Diagnosis not present

## 2019-08-20 DIAGNOSIS — E872 Acidosis: Secondary | ICD-10-CM | POA: Diagnosis present

## 2019-08-20 DIAGNOSIS — E876 Hypokalemia: Secondary | ICD-10-CM | POA: Diagnosis not present

## 2019-08-20 DIAGNOSIS — J9600 Acute respiratory failure, unspecified whether with hypoxia or hypercapnia: Secondary | ICD-10-CM

## 2019-08-20 DIAGNOSIS — I472 Ventricular tachycardia: Secondary | ICD-10-CM | POA: Diagnosis not present

## 2019-08-20 DIAGNOSIS — Z955 Presence of coronary angioplasty implant and graft: Secondary | ICD-10-CM

## 2019-08-20 DIAGNOSIS — R34 Anuria and oliguria: Secondary | ICD-10-CM | POA: Diagnosis present

## 2019-08-20 LAB — FERRITIN: Ferritin: 2287 ng/mL — ABNORMAL HIGH (ref 24–336)

## 2019-08-20 LAB — COMPREHENSIVE METABOLIC PANEL
ALT: 68 U/L — ABNORMAL HIGH (ref 0–44)
AST: 139 U/L — ABNORMAL HIGH (ref 15–41)
Albumin: 3 g/dL — ABNORMAL LOW (ref 3.5–5.0)
Alkaline Phosphatase: 73 U/L (ref 38–126)
Anion gap: >20 — ABNORMAL HIGH (ref 5–15)
BUN: 30 mg/dL — ABNORMAL HIGH (ref 8–23)
CO2: 18 mmol/L — ABNORMAL LOW (ref 22–32)
Calcium: 8.1 mg/dL — ABNORMAL LOW (ref 8.9–10.3)
Chloride: 97 mmol/L — ABNORMAL LOW (ref 98–111)
Creatinine, Ser: 1.05 mg/dL (ref 0.61–1.24)
GFR calc Af Amer: >60 mL/min (ref >60)
GFR calc non Af Amer: >60 mL/min (ref >60)
Glucose, Bld: 236 mg/dL — ABNORMAL HIGH (ref 70–99)
Potassium: 3.7 mmol/L (ref 3.5–5.1)
Sodium: 136 mmol/L (ref 135–145)
Total Bilirubin: 1.4 mg/dL — ABNORMAL HIGH (ref 0.3–1.2)
Total Protein: 7 g/dL (ref 6.5–8.1)

## 2019-08-20 LAB — BETA-HYDROXYBUTYRIC ACID: Beta-Hydroxybutyric Acid: 6.1 mmol/L — ABNORMAL HIGH (ref 0.05–0.27)

## 2019-08-20 LAB — LACTATE DEHYDROGENASE: LDH: 704 U/L — ABNORMAL HIGH (ref 98–192)

## 2019-08-20 LAB — CBC WITH DIFFERENTIAL/PLATELET
Abs Immature Granulocytes: 0.04 10*3/uL (ref 0.00–0.07)
Basophils Absolute: 0 10*3/uL (ref 0.0–0.1)
Basophils Relative: 0 %
Eosinophils Absolute: 0.1 10*3/uL (ref 0.0–0.5)
Eosinophils Relative: 2 %
HCT: 43.6 % (ref 39.0–52.0)
Hemoglobin: 14.2 g/dL (ref 13.0–17.0)
Immature Granulocytes: 1 %
Lymphocytes Relative: 15 %
Lymphs Abs: 1 10*3/uL (ref 0.7–4.0)
MCH: 30.8 pg (ref 26.0–34.0)
MCHC: 32.6 g/dL (ref 30.0–36.0)
MCV: 94.6 fL (ref 80.0–100.0)
Monocytes Absolute: 0.4 10*3/uL (ref 0.1–1.0)
Monocytes Relative: 6 %
Neutro Abs: 4.8 10*3/uL (ref 1.7–7.7)
Neutrophils Relative %: 76 %
Platelets: 208 10*3/uL (ref 150–400)
RBC: 4.61 MIL/uL (ref 4.22–5.81)
RDW: 13.6 % (ref 11.5–15.5)
WBC: 6.4 10*3/uL (ref 4.0–10.5)
nRBC: 0 % (ref 0.0–0.2)

## 2019-08-20 LAB — BLOOD GAS, VENOUS
Acid-base deficit: 2.3 mmol/L — ABNORMAL HIGH (ref 0.0–2.0)
Bicarbonate: 22.3 mmol/L (ref 20.0–28.0)
FIO2: 100
O2 Saturation: 66.9 %
Patient temperature: 38.1
pCO2, Ven: 33.3 mmHg — ABNORMAL LOW (ref 44.0–60.0)
pH, Ven: 7.424 (ref 7.250–7.430)
pO2, Ven: 38.2 mmHg (ref 32.0–45.0)

## 2019-08-20 LAB — TRIGLYCERIDES: Triglycerides: 186 mg/dL — ABNORMAL HIGH (ref <150)

## 2019-08-20 LAB — LACTIC ACID, PLASMA
Lactic Acid, Venous: 2.9 mmol/L (ref 0.5–1.9)
Lactic Acid, Venous: 2.9 mmol/L (ref 0.5–1.9)

## 2019-08-20 LAB — PROCALCITONIN: Procalcitonin: 0.6 ng/mL

## 2019-08-20 LAB — C-REACTIVE PROTEIN: CRP: 12.1 mg/dL — ABNORMAL HIGH (ref <1.0)

## 2019-08-20 LAB — FIBRINOGEN: Fibrinogen: 636 mg/dL — ABNORMAL HIGH (ref 210–475)

## 2019-08-20 LAB — D-DIMER, QUANTITATIVE: D-Dimer, Quant: 2.57 ug/mL-FEU — ABNORMAL HIGH (ref 0.00–0.50)

## 2019-08-20 MED ORDER — FAMOTIDINE 20 MG PO TABS
20.0000 mg | ORAL_TABLET | Freq: Every day | ORAL | Status: DC
  Administered 2019-08-20 – 2019-08-24 (×5): 20 mg via ORAL
  Filled 2019-08-20 (×5): qty 1

## 2019-08-20 MED ORDER — ONDANSETRON HCL 4 MG PO TABS: 4.0000 mg | ORAL_TABLET | Freq: Four times a day (QID) | ORAL | Status: DC | PRN

## 2019-08-20 MED ORDER — DEXAMETHASONE SODIUM PHOSPHATE 10 MG/ML IJ SOLN
10.0000 mg | Freq: Once | INTRAMUSCULAR | Status: AC
  Administered 2019-08-20: 18:00:00 10 mg via INTRAVENOUS
  Filled 2019-08-20: qty 1

## 2019-08-20 MED ORDER — HEPARIN SODIUM (PORCINE) 5000 UNIT/ML IJ SOLN
5000.0000 [IU] | Freq: Three times a day (TID) | INTRAMUSCULAR | Status: DC
  Administered 2019-08-20 – 2019-08-22 (×5): 5000 [IU] via SUBCUTANEOUS
  Filled 2019-08-20 (×5): qty 1

## 2019-08-20 MED ORDER — IPRATROPIUM BROMIDE HFA 17 MCG/ACT IN AERS
2.0000 | INHALATION_SPRAY | Freq: Four times a day (QID) | RESPIRATORY_TRACT | Status: DC
  Administered 2019-08-20 – 2019-08-23 (×11): 2 via RESPIRATORY_TRACT
  Filled 2019-08-20 (×2): qty 12.9

## 2019-08-20 MED ORDER — SENNA 8.6 MG PO TABS
1.0000 | ORAL_TABLET | Freq: Two times a day (BID) | ORAL | Status: DC
  Administered 2019-08-21 – 2019-09-12 (×36): 8.6 mg via ORAL
  Filled 2019-08-20 (×37): qty 1

## 2019-08-20 MED ORDER — SODIUM CHLORIDE 0.9 % IV SOLN: INTRAVENOUS | Status: DC

## 2019-08-20 MED ORDER — ASPIRIN EC 81 MG PO TBEC
81.0000 mg | DELAYED_RELEASE_TABLET | Freq: Two times a day (BID) | ORAL | Status: DC
  Administered 2019-08-20 – 2019-08-25 (×10): 81 mg via ORAL
  Filled 2019-08-20 (×10): qty 1

## 2019-08-20 MED ORDER — ROSUVASTATIN CALCIUM 10 MG PO TABS
10.0000 mg | ORAL_TABLET | Freq: Every day | ORAL | Status: DC
  Administered 2019-08-20 – 2019-09-07 (×19): 10 mg via ORAL
  Filled 2019-08-20 (×4): qty 1
  Filled 2019-08-20: qty 2
  Filled 2019-08-20 (×10): qty 1
  Filled 2019-08-20: qty 2
  Filled 2019-08-20 (×3): qty 1

## 2019-08-20 MED ORDER — TRAMADOL HCL 50 MG PO TABS: 50.0000 mg | ORAL_TABLET | Freq: Four times a day (QID) | ORAL | Status: DC | PRN

## 2019-08-20 MED ORDER — ALBUTEROL SULFATE HFA 108 (90 BASE) MCG/ACT IN AERS
2.0000 | INHALATION_SPRAY | RESPIRATORY_TRACT | Status: DC
  Administered 2019-08-20 – 2019-08-21 (×3): 2 via RESPIRATORY_TRACT
  Filled 2019-08-20: qty 6.7

## 2019-08-20 MED ORDER — CLOPIDOGREL BISULFATE 75 MG PO TABS
75.0000 mg | ORAL_TABLET | Freq: Every day | ORAL | Status: DC
  Administered 2019-08-20 – 2019-09-10 (×22): 75 mg via ORAL
  Filled 2019-08-20 (×22): qty 1

## 2019-08-20 MED ORDER — METOPROLOL SUCCINATE ER 25 MG PO TB24
25.0000 mg | ORAL_TABLET | Freq: Every day | ORAL | Status: DC
  Administered 2019-08-20 – 2019-08-22 (×3): 25 mg via ORAL
  Filled 2019-08-20 (×4): qty 1

## 2019-08-20 MED ORDER — ESCITALOPRAM OXALATE 10 MG PO TABS
20.0000 mg | ORAL_TABLET | Freq: Every day | ORAL | Status: DC
  Administered 2019-08-20 – 2019-08-22 (×3): 20 mg via ORAL
  Filled 2019-08-20 (×2): qty 2
  Filled 2019-08-20: qty 1
  Filled 2019-08-20 (×2): qty 2

## 2019-08-20 MED ORDER — SODIUM CHLORIDE 0.9 % IV SOLN
200.0000 mg | Freq: Once | INTRAVENOUS | Status: AC
  Administered 2019-08-20: 200 mg via INTRAVENOUS
  Filled 2019-08-20: qty 40

## 2019-08-20 MED ORDER — ACETAMINOPHEN 325 MG PO TABS
650.0000 mg | ORAL_TABLET | Freq: Once | ORAL | Status: AC
  Administered 2019-08-20: 650 mg via ORAL
  Filled 2019-08-20: qty 2

## 2019-08-20 MED ORDER — BUPROPION HCL ER (SR) 100 MG PO TB12
100.0000 mg | ORAL_TABLET | Freq: Two times a day (BID) | ORAL | Status: DC
  Administered 2019-08-20 – 2019-09-12 (×47): 100 mg via ORAL
  Filled 2019-08-20 (×52): qty 1

## 2019-08-20 MED ORDER — SODIUM CHLORIDE 0.9 % IV SOLN
100.0000 mg | Freq: Every day | INTRAVENOUS | Status: AC
  Administered 2019-08-21 – 2019-08-24 (×4): 100 mg via INTRAVENOUS
  Filled 2019-08-20 (×5): qty 20

## 2019-08-20 MED ORDER — SODIUM CHLORIDE 0.9 % IV BOLUS
1000.0000 mL | Freq: Once | INTRAVENOUS | Status: AC
  Administered 2019-08-20: 1000 mL via INTRAVENOUS

## 2019-08-20 MED ORDER — ACETAMINOPHEN 325 MG PO TABS
650.0000 mg | ORAL_TABLET | Freq: Four times a day (QID) | ORAL | Status: DC | PRN
  Administered 2019-08-24 – 2019-09-04 (×20): 650 mg via ORAL
  Filled 2019-08-20 (×19): qty 2

## 2019-08-20 MED ORDER — GUAIFENESIN-DM 100-10 MG/5ML PO SYRP: 10.0000 mL | ORAL_SOLUTION | ORAL | Status: DC | PRN

## 2019-08-20 MED ORDER — ONDANSETRON HCL 4 MG/2ML IJ SOLN
4.0000 mg | Freq: Four times a day (QID) | INTRAMUSCULAR | Status: DC | PRN
  Filled 2019-08-20: qty 2

## 2019-08-20 NOTE — ED Notes (Signed)
CRITICAL VALUE ALERT  Critical Value:  Lactic Acid 2.9  Date & Time Notied:  08-28-2019 1531  Provider Notified: Donna Christen PA  Orders Received/Actions taken: None yet

## 2019-08-20 NOTE — Telephone Encounter (Signed)
If the hiccups last for more than 48 hours, we can trial medications to help.

## 2019-08-20 NOTE — ED Notes (Signed)
Respiratory paged. 77% on Non rebreather

## 2019-08-20 NOTE — Telephone Encounter (Signed)
Patient aware and verbalized understanding. °

## 2019-08-20 NOTE — Telephone Encounter (Signed)
Patient has been hiccup since last night. It started as soon as he took the zinc. The last time he took one was at 11:45Pm. Patient still has hiccups can you advise anything?

## 2019-08-20 NOTE — ED Provider Notes (Signed)
[facility] EMERGENCY DEPARTMENT Provider Note   CSN: [id number] Arrival date & time: Sep 11, 2019  1228     History Chief Complaint  Patient presents with  . COVID +    [name] [name] is a 63 y.o. male with PMH significant for CAD s/p bypass grafting and stents, HLD, HTN, OSA, and most notably positive COVID-19 testing obtained 3 days ago during evaluation in the ED.  Patient endorses a 9-day history of progressively worsening COVID-19-like symptoms.  According to EMS, patient was 65% on RA at home when they found him.  Patient was given Rocephin en route to hospital.  On my exam, patient was 78% on 15 L nonrebreather before improving 86%.  Respiratory was called and are en route to the room.  Patient is able to communicate well and does not appear confused.  He complains of worsening fatigue, body aches, difficulty breathing, diminished appetite, and feeling poorly.   HPI     Past Medical History:  Diagnosis Date  . Chest pain        . Coronary artery disease    post bypass grafting in 1996-.SVG was stented  to his ramus branch in 2006     . Diabetes mellitus without complication (HCC)    noninsulin -requiring diabetes   . Dyspnea    MYOVIEW STRESS TEST--PERFORMED 04/30/2009--WHICH SHOWED  NEW LATERAL ISCHEMIA.   Marland Kitchen Hyperlipidemia   . Hypertension   . Right bundle branch block   . Sleep apnea    pt has a cpap machine    Patient Active Problem List   Diagnosis Date Noted  . COVID-19 2019-09-11  . Helicobacter pylori gastritis 10/19/2015  . Schatzki's ring   . Hiatal hernia   . Mucosal abnormality of stomach   . Mucosal abnormality of esophagus   . Food impaction of esophagus 01/30/2015  . CAD, CABG '96. SVG-RI PCI '06, 1/11. Cath 4/13 with ISR- medical Rx only 01/22/2013  . HTN (hypertension) 01/22/2013  . Dyslipidemia 01/22/2013  . Diabetes mellitus, Type 2 NIDDM 01/22/2013  . Sleep apnea 01/22/2013  . RBBB 01/22/2013  . Obesity (BMI 30.0-34.9) 01/22/2013  . GERD  05/14/2009  . ESOPHAGEAL STRICTURE 05/12/2009  . SCHATZKI'S RING, HX OF 05/12/2009    Past Surgical History:  Procedure Laterality Date  . ADULT ECHOCARDIOGRAPHY  11/08/2004   The left ventricle is normal size .There is normal left ventricular wall thickness .EJECTION FRACTION =>55%.The LA is mildly dilated .The main PA is imaged to the bicfucation into right and left branches with normal size noted.Based on an assumed RA pressure orf 10 and a end diastolic PR gradient of 1.2 m/sec the PA end diastolic pressure is estimated to be ~16 which is top normal  . CARDIAC CATHETERIZATION  01 /21/2011    REVEALING 75 % IN - STENT RESTENOSIS WITHIN  THE STENTED SEGMENT WHICH I RESTENTED WITH A DRUG ELUTING STENT   . CARDIAC CATHETERIZATION  04/ 24 /2013   THAT SHOWED 95 % IN STENT RESTENOSIS WITHIN THE PREVIOUSLY RESTENTED SEGMENT . iI ELECTED TO TREAT HIM MEDICALLY/ PT ASYMPTOMATIC SINCE  . CARDIOVASCULAR STRESS TEST  SEPT 10 2010   NEW LATERAL ISCHEMIA  . CARDIOVASCULAR STRESS TEST  November 01 2009   SHOWED RESOULTION OF HIS ISCHEMIA ABNORMALITY. PT WAS ASYMPTOMATIC  AFTER THAT  . CARDIOVASCULAR STRESS TEST  December 08 2011   MYOVIEW SHOWED NEW ANTEROLATERAL ISCHEMIA (LED TO A CATH)  . Carotid Doppler  11 /23/2013   [place]may -Bilateral Bulb ICA's  demonstrated a mild amount of fibrous plaquewith no evidence of significant diameter  reduction ,tortuosity or any other vascular abnormality. This is midly abnormal carotid duple doppler eval. When compared to the previous study dated 08/12/2007,there are no significant changes noted.Noted it is recommend that this test be repeated when clinically indicated aque   . COLONOSCOPY  11/2007   Dr. Jena Gauss: normal. repeat 11/2017.  Marland Kitchen ESOPHAGEAL DILATION N/A 04/16/2015   Procedure: ESOPHAGEAL DILATION;  Surgeon: Corbin Ade, MD;  Location: [facility] END[facility] SUITE;  Service: Endoscopy;  Laterality: N/A;  . ESOPHAGOGASTRODUODENOSCOPY N/A 01/30/2015   Dr. Charlott Rakes: food  impaction-foreign body removal, distal esophageal stricture, ulcerated esophagus due to food impaction.   . ESOPHAGOGASTRODUODENOSCOPY  11/2007   Dr. Jena Gauss: Schatzki ring s/p dilation, small hh  . ESOPHAGOGASTRODUODENOSCOPY  10/2007   Dr. Carman Ching: food impaction, friable esophageal ring  . ESOPHAGOGASTRODUODENOSCOPY N/A 04/16/2015   RMR: Distal tandem esophageal ring status pos Maloney dilation. Abnormal distal esophagus. Status post esophageal biopsy. Hiatal hernia. Abnormal gastric mucosa of uncertain significance Status post gastric biopsy.  Marland Kitchen LEFT HEART CATHETERIZATION WITH CORONARY ANGIOGRAM N/A 12/13/2011   Procedure: LEFT HEART CATHETERIZATION WITH CORONARY ANGIOGRAM;  Surgeon: Runell Gess, MD;  Location: [facility] CATH LAB;  Service: Cardiovascular;  Laterality: N/A;  . sleep  study report  05/03/2007   The patient has undergone a diagnostic polysomnogram which confirmed the diagnosis of sleep disordered breathing with an AHI of 5.4/hr and 24.91/hr during REM sleep  . SLEEP STUDY  07/ 06/2007   The (AHI) formerly known as RDI , during the total sleep time  6 h 17 min  was 5.41/ hr and during Rem sleep at 24.91/hr .This places the patient into the mild sleep apnea category . The average oxygen saturation range   . SLEEP STUDY  07 11 2008   (cont-) range during REM was 91 % The lowest oxygen saturation during NON-REM and REM sleep was 86% and 87 % respectively . The patient was obsered snoring loudly       Family History  Problem Relation Age of Onset  . Diabetes Mother   . Diabetes Brother   . Diabetes Sister   . Colon cancer Neg Hx     Social History   Tobacco Use  . Smoking status: Former Smoker    Quit date: 02/19/1995    Years since quitting: 24.5  . Smokeless tobacco: Never Used  . Tobacco comment: Quit x 20 years  Substance Use Topics  . Alcohol use: No    Alcohol/week: 0.0 standard drinks  . Drug use: No    Home Medications Prior to Admission medications     Medication Sig Start Date End Date Taking? Authorizing Provider  acetaminophen (TYLENOL 8 HOUR ARTHRITIS PAIN) 650 MG CR tablet Take 650 mg by mouth every 8 (eight) hours as needed for pain or fever.   Yes [provider]  aspirin EC 81 MG tablet Take 81 mg by mouth 2 (two) times daily.    Yes [provider]  benzonatate (TESSALON) 100 MG capsule Take 1 capsule (100 mg total) by mouth every 8 (eight) hours. 08/17/19  Yes Aberman, Merla Riches, PA-C  buPROPion (WELLBUTRIN SR) 100 MG 12 hr tablet Take 100 mg by mouth 2 (two) times daily.  03/07/19  Yes [provider]  Canagliflozin-metFORMIN HCl (INVOKAMET) [phone] MG TABS Take 1 tablet by mouth 2 (two) times daily.  04/17/19  Yes [provider]  celecoxib (CELEBREX) 200 MG  capsule Take 1 capsule (200 mg total) by mouth daily. With food 05/20/19  Yes Stacks, Broadus Kyri, MD  Chlorphen-Phenyleph-ASA (ALKA-SELTZER PLUS COLD) 2-7.8-325 MG TBEF Take 1 tablet by mouth daily as needed (for cold symptoms).   Yes [provider]  clopidogrel (PLAVIX) 75 MG tablet Take 75 mg by mouth daily.   Yes [provider]  escitalopram (LEXAPRO) 20 MG tablet Take 20 mg by mouth at bedtime.    Yes [provider]  famotidine (PEPCID) 20 MG tablet Take 1 tablet (20 mg total) by mouth 2 (two) times daily. Patient taking differently: Take 20 mg by mouth daily.  05/20/19  Yes Mechele Claude, MD  fish oil-omega-3 fatty acids 1000 MG capsule Take 1 g by mouth daily.    Yes [provider]  Insulin Degludec 200 UNIT/ML SOPN Inject 30 Units into the skin daily. Patient taking differently: Inject 30 Units into the skin every morning.  08/11/19  Yes Mechele Claude, MD  losartan (COZAAR) 100 MG tablet Take 100 mg by mouth every evening.  11/28/12  Yes [provider]  metoprolol succinate (TOPROL-XL) 25 MG 24 hr tablet Take 25 mg by mouth at bedtime.    Yes [provider]  Multiple Vitamin  (MULITIVITAMIN WITH MINERALS) TABS Take 1 tablet by mouth daily.   Yes [provider]  ondansetron (ZOFRAN ODT) 4 MG disintegrating tablet Take 1 tablet (4 mg total) by mouth every 8 (eight) hours as needed for nausea or vomiting. 08/17/19  Yes Aberman, Merla Riches, PA-C  predniSONE (DELTASONE) 10 MG tablet Take 5 daily for 2 days followed by 4,3,2 and 1 for 2 days each. Patient taking differently: Take 10 mg by mouth See admin instructions. Take 5 daily for 2 days followed by 4,3,2 and 1 for 2 days each. 08/11/19  Yes Mechele Claude, MD  rosuvastatin (CRESTOR) 10 MG tablet Take 1 tablet (10 mg total) by mouth daily. For cholesterol 05/20/19  Yes Stacks, Broadus Deni, MD  Semaglutide, 1 MG/DOSE, (OZEMPIC, 1 MG/DOSE,) 2 MG/1.5ML SOPN Inject 1 mg into the skin every Monday.  12/11/18  Yes [provider]  Vitamin D, Ergocalciferol, (DRISDOL) 1.25 MG (50000 UT) CAPS capsule Take 1 capsule (50,000 Units total) by mouth every 7 (seven) days. 05/22/19 05/20/20 Yes Mechele Claude, MD    Allergies    Hydrocodone-acetaminophen and Hydrochlorothiazide  Review of Systems   Review of Systems  All other systems reviewed and are negative.   Physical Exam Updated Vital Signs BP (!) 101/48   Pulse 82   Temp (!) 100.5 F (38.1 C) (Rectal)   Resp (!) 27   Wt 91.2 kg   SpO2 92%   BMI 28.85 kg/m   Physical Exam Vitals and nursing note reviewed. Exam conducted with a chaperone present.  Constitutional:      General: He is in acute distress.     Appearance: He is ill-appearing.  HENT:     Head: Normocephalic and atraumatic.     Mouth/Throat:     Pharynx: Oropharynx is clear.  Eyes:     General: No scleral icterus.    Conjunctiva/sclera: Conjunctivae normal.  Cardiovascular:     Rate and Rhythm: Normal rate and regular rhythm.     Pulses: Normal pulses.     Heart sounds: Normal heart sounds.  Pulmonary:     Comments: Moderately increased respiratory effort.  Crackles auscultated in  lower lobes bilaterally. Abdominal:     General: Abdomen is flat. There is no distension.  Palpations: Abdomen is soft.     Tenderness: There is no abdominal tenderness. There is no guarding.  Skin:    Comments: Cool skin. Mottling noted diffusely, most notably on chest and abdomen.  Neurological:     Mental Status: He is alert and oriented to person, place, and time.     GCS: GCS eye subscore is 4. GCS verbal subscore is 5. GCS motor subscore is 6.  Psychiatric:        Mood and Affect: Mood normal.        Behavior: Behavior normal.        Thought Content: Thought content normal.     ED Results / Procedures / Treatments   Labs (all labs ordered are listed, but only abnormal results are displayed) Labs Reviewed  LACTIC ACID, PLASMA - Abnormal; Notable for the following components:      Result Value   Lactic Acid, Venous 2.9 (*)    All other components within normal limits  LACTIC ACID, PLASMA - Abnormal; Notable for the following components:   Lactic Acid, Venous 2.9 (*)    All other components within normal limits  COMPREHENSIVE METABOLIC PANEL - Abnormal; Notable for the following components:   Chloride 97 (*)    CO2 18 (*)    Glucose, Bld 236 (*)    BUN 30 (*)    Calcium 8.1 (*)    Albumin 3.0 (*)    AST 139 (*)    ALT 68 (*)    Total Bilirubin 1.4 (*)    Anion gap >20 (*)    All other components within normal limits  D-DIMER, QUANTITATIVE (NOT [facility]) - Abnormal; Notable for the following components:   D-Dimer, Quant 2.57 (*)    All other components within normal limits  LACTATE DEHYDROGENASE - Abnormal; Notable for the following components:   LDH 704 (*)    All other components within normal limits  TRIGLYCERIDES - Abnormal; Notable for the following components:   Triglycerides 186 (*)    All other components within normal limits  FIBRINOGEN - Abnormal; Notable for the following components:   Fibrinogen 636 (*)    All other components within normal limits    BLOOD GAS, VENOUS - Abnormal; Notable for the following components:   pCO2, Ven 33.3 (*)    Acid-base deficit 2.3 (*)    All other components within normal limits  BETA-HYDROXYBUTYRIC ACID - Abnormal; Notable for the following components:   Beta-Hydroxybutyric Acid 6.10 (*)    All other components within normal limits  CULTURE, BLOOD (ROUTINE X 2)  CULTURE, BLOOD (ROUTINE X 2)  CBC WITH DIFFERENTIAL/PLATELET  PROCALCITONIN  FERRITIN  C-REACTIVE PROTEIN    EKG EKG Interpretation  Date/Time:  Wednesday August 20 2019 12:47:26 EST Ventricular Rate:  93 PR Interval:    QRS Duration: 154 QT Interval:  397 QTC Calculation: 494 R Axis:   -2 Text Interpretation: Sinus rhythm Right bundle branch block No significant change since last tracing Confirmed by Vanetta MuldersZackowski, Scott [phone]([id number]) on 07/30/2019 12:51:56 PM   Radiology DG Chest Port 1 View  Result Date: 07/28/2019 CLINICAL DATA:  Shortness of breath, hypoxia, COVID-19 positive EXAM: PORTABLE CHEST 1 VIEW COMPARISON:  08/17/2019 FINDINGS: Postsurgical changes to the sternum and mediastinum. Stable heart size. Increased patchy opacities within the bilateral lungs, right greater than left. Opacities are in a predominantly peripheral distribution. No large pleural fluid collection. No pneumothorax. IMPRESSION: Increased patchy bilateral airspace opacities suspicious for multifocal atypical/viral pneumonia. Electronically Signed  By: Duanne Guess D.O.   On: 2019/08/26 13:22    Procedures Procedures (including critical care time)  Medications Ordered in ED Medications  dexamethasone (DECADRON) injection 10 mg (has no administration in time range)  remdesivir 200 mg in sodium chloride 0.9% 250 mL IVPB (has no administration in time range)    Followed by  remdesivir 100 mg in sodium chloride 0.9 % 100 mL IVPB (has no administration in time range)  acetaminophen (TYLENOL) tablet 650 mg (650 mg Oral Given 08/26/19 1449)  sodium  chloride 0.9 % bolus 1,000 mL (1,000 mLs Intravenous New Bag/Given 2019/08/26 1818)    ED Course  I have reviewed the triage vital signs and the nursing notes.  Pertinent labs & imaging results that were available during my care of the patient were reviewed by me and considered in my medical decision making (see chart for details).    MDM Rules/Calculators/A&P                      DG chest demonstrates increased patchy bilateral airspace opacities concerning for multifocal pneumonia, likely viral etiology.  EKG demonstrates RBBB without significant changes when compared to prior tracings.  After placing patient in a prone position, he is able to maintain 100% oxygen saturation while on high flow oxygen via nonrebreather.  When he flips over into the supine position, he once again becomes tachypneic into the 40s with oxygenation dipping into the 80s.  While patient uses a CPAP at night for his OSA, he does not typically require supplemental oxygen at home.  D-dimer is elevated 2.57 from 1.08 obtained 3 days ago, but not entirely surprising in the setting of a COVID-19 pneumonia. Discussed with Dr. Deretha Emory and do not feel as though PE work-up is warranted given lack of particularly concerning clinical correlation.  This was a progressively worsening dyspnea and weakness rather than an acute onset shortness of breath or pleuritic chest pain concerning for PE.   On reexamination, patient is resting comfortably in prone position oxygenating present on 15 L supplemental oxygen via nonrebreather.  Patient is still tachypneic in 30s.  He denies any chest pain, simply endorses profound fatigue.  He is not altered and is alert and oriented x4.  Lactic acid elevated at 2.9.  Fluids provided.  LDH worsened to 704.  He also has an elevated blood glucose and high anion gap metabolic acidosis.  Patient will need to be admitted for new onset oxygen requirement and hypoxia in the setting of COVID-19 pneumonia.  Will  consult hospitalist for admission.  Spoke with hospitalist who will admit patient.    Final Clinical Impression(s) / ED Diagnoses Final diagnoses:  Hypoxia  COVID-19    Rx / DC Orders ED Discharge Orders    None       Lorelee New, PA-C 08/26/19 1819    Vanetta Mulders, MD 08/22/19 [phone]

## 2019-08-20 NOTE — ED Triage Notes (Signed)
Pt was confirmed COVID on Friday. Rales and crackles in lower lobes. Per EMS, septic and mottled. Gave 1 gram Rocephin en route and 500 mL NS. Sats 65% with EMS on room air. 4L [place] with EMS, 86%. CO2 17.

## 2019-08-20 NOTE — Telephone Encounter (Signed)
Wife put husband on the phone and he was concerned about stroke. His speech was slurred and very slow having vision trouble. Advised patient to put wife back on phone advised for her to call 911 right away and have them come out and evaluate patient let them know he is COVID positive. Wife verbalized understanding stated she was calling 911 as soon as we hung up. FYI

## 2019-08-20 NOTE — Progress Notes (Signed)
Pharmacy Consult - Remdesivir  34 yom presenting COVID-19 positive on 08/17/19 with respiratory symptoms requiring hospitalization. Pharmacy consulted to dose Remdesivir. ALT 68.    Plan: Remdesivir 200mg  IV x 1; then 100mg  IV q24h to complete 5 total doses Monitor clinical progress, ALT   Antonietta Jewel, PharmD, BCCCP Clinical Pharmacist  Phone: [phone]  Please check AMION for all [place] phone numbers After 10:00 PM, call [place] [phone] 08-23-19 5:40 PM

## 2019-08-20 NOTE — H&P (Signed)
TRH H&P    Patient Demographics:    [name] [name], is a 63 y.o. male  MRN: [id number]  DOB - [birth year]  Admit Date - 08/07/2019  Referring MD/NP/PA: Chilton Si   Outpatient Primary MD for the patient is Mechele Claude, MD  Patient coming from: Home   Chief complaint- Shortness of breath   HPI:    [name] [name]  is a 63 y.o. male, with history of hypertension, hyperlipidemia, diabetes mellitus, CAD, and sleep apnea who presents to the hospital with a c/c of shortness of breath.  Patient reports about a week of progressively were worsening flulike symptoms.  Patient reports that he has been short of breath, had a nonproductive cough, felt feverish, had body aches, and has become progressively weaker.  Wife called PCP today and then decided the patient needed to go to the ED.  EMS was called and upon their arrival they reported an oxygen saturation of 65% on room air.  Patient was given Rocephin in route to the hospital.  Patient's oxygenation slowly improved in the ED when the initial provider saw him he was only 78% on 15 L nonrebreather by the time of my exam he was 100% on 15 L nonrebreather prone position.  Patient reports associated poor appetite, diarrhea, and headache.  He does report that the diarrhea has resolved his he has not eaten anything recently. Patient reports no chest pain or palpitations.  ED course White blood cell count of 6.4, hemoglobin is 14.2 CHEM panel is grossly normal with a glucose of 236 Anion gap of 20 Lactic acid of 2.9 LDH of 704, triglycerides 186, pro-Cal 0.60, D-dimer 2.57, fibrinogen 636 Blood cultures Chest x-ray showed increased patchy bilateral airspace opacities suspicious for multifocal atypical/viral pneumonia Temperature 100.5, pulse 87-94, respiratory rate 22-39, blood pressure 144/63, oxygen saturation 75% to 100% 1 L normal saline given Covid positive   Review of systems:      In addition to the HPI above,  Positive for fever-chills, Positive for headache, No changes with Vision or hearing, No problems swallowing food or Liquids, Positive for chest pain, cough, and shortness of breath No Abdominal pain, endorses nausea, bowel movements are regular, No Blood in stool or Urine, No dysuria, No new skin rashes or bruises, Endorses myalgias Generalized weakness, no tingling, numbness in any extremity, No recent weight gain or loss, No polyuria, polydypsia or polyphagia, No significant Mental Stressors.  All other systems reviewed and are negative.    Past History of the following :    Past Medical History:  Diagnosis Date  . Chest pain        . Coronary artery disease    post bypass grafting in 1996-.SVG was stented  to his ramus branch in 2006     . Diabetes mellitus without complication (HCC)    noninsulin -requiring diabetes   . Dyspnea    MYOVIEW STRESS TEST--PERFORMED 04/30/2009--WHICH SHOWED  NEW LATERAL ISCHEMIA.   Marland Kitchen Hyperlipidemia   . Hypertension   . Right bundle branch block   . Sleep apnea  pt has a cpap machine      Past Surgical History:  Procedure Laterality Date  . ADULT ECHOCARDIOGRAPHY  11/08/2004   The left ventricle is normal size .There is normal left ventricular wall thickness .EJECTION FRACTION =>55%.The LA is mildly dilated .The main PA is imaged to the bicfucation into right and left branches with normal size noted.Based on an assumed RA pressure orf 10 and a end diastolic PR gradient of 1.2 m/sec the PA end diastolic pressure is estimated to be ~16 which is top normal  . CARDIAC CATHETERIZATION  01 /21/2011    REVEALING 75 % IN - STENT RESTENOSIS WITHIN  THE STENTED SEGMENT WHICH I RESTENTED WITH A DRUG ELUTING STENT   . CARDIAC CATHETERIZATION  04/ 24 /2013   THAT SHOWED 95 % IN STENT RESTENOSIS WITHIN THE PREVIOUSLY RESTENTED SEGMENT . iI ELECTED TO TREAT HIM MEDICALLY/ PT ASYMPTOMATIC SINCE  . CARDIOVASCULAR STRESS  TEST  SEPT 10 2010   NEW LATERAL ISCHEMIA  . CARDIOVASCULAR STRESS TEST  November 01 2009   SHOWED RESOULTION OF HIS ISCHEMIA ABNORMALITY. PT WAS ASYMPTOMATIC  AFTER THAT  . CARDIOVASCULAR STRESS TEST  December 08 2011   MYOVIEW SHOWED NEW ANTEROLATERAL ISCHEMIA (LED TO A CATH)  . Carotid Doppler  11 /23/2013   Summay -Bilateral Bulb ICA's demonstrated a mild amount of fibrous plaquewith no evidence of significant diameter  reduction ,tortuosity or any other vascular abnormality. This is midly abnormal carotid duple doppler eval. When compared to the previous study dated 08/12/2007,there are no significant changes noted.Noted it is recommend that this test be repeated when clinically indicated aque   . COLONOSCOPY  11/2007   Dr. Gala Romney: normal. repeat 11/2017.  Marland Kitchen ESOPHAGEAL DILATION N/A 04/16/2015   Procedure: ESOPHAGEAL DILATION;  Surgeon: Daneil Dolin, MD;  Location: [facility];  Service: Endoscopy;  Laterality: N/A;  . ESOPHAGOGASTRODUODENOSCOPY N/A 01/30/2015   Dr. Wilford Corner: food impaction-foreign body removal, distal esophageal stricture, ulcerated esophagus due to food impaction.   . ESOPHAGOGASTRODUODENOSCOPY  11/2007   Dr. Gala Romney: Schatzki ring s/p dilation, small hh  . ESOPHAGOGASTRODUODENOSCOPY  10/2007   Dr. Laurence Spates: food impaction, friable esophageal ring  . ESOPHAGOGASTRODUODENOSCOPY N/A 04/16/2015   RMR: Distal tandem esophageal ring status pos Maloney dilation. Abnormal distal esophagus. Status post esophageal biopsy. Hiatal hernia. Abnormal gastric mucosa of uncertain significance Status post gastric biopsy.  Marland Kitchen LEFT HEART CATHETERIZATION WITH CORONARY ANGIOGRAM N/A 12/13/2011   Procedure: LEFT HEART CATHETERIZATION WITH CORONARY ANGIOGRAM;  Surgeon: Lorretta Harp, MD;  Location: [facility] CATH LAB;  Service: Cardiovascular;  Laterality: N/A;  . sleep  study report  05/03/2007   The patient has undergone a diagnostic polysomnogram which confirmed the diagnosis of sleep disordered  breathing with an AHI of 5.4/hr and 24.91/hr during REM sleep  . SLEEP STUDY  07/ 06/2007   The (AHI) formerly known as RDI , during the total sleep time  6 h 17 min  was 5.41/ hr and during Rem sleep at 24.91/hr .This places the patient into the mild sleep apnea category . The average oxygen saturation range   . SLEEP STUDY  07 11 2008   (cont-) range during REM was 91 % The lowest oxygen saturation during NON-REM and REM sleep was 86% and 87 % respectively . The patient was obsered snoring loudly      Social History:      Social History   Tobacco Use  . Smoking status: Former Smoker  Quit date: 02/19/1995    Years since quitting: 24.5  . Smokeless tobacco: Never Used  . Tobacco comment: Quit x 20 years  Substance Use Topics  . Alcohol use: No    Alcohol/week: 0.0 standard drinks       Family History :     Family History  Problem Relation Age of Onset  . Diabetes Mother   . Diabetes Brother   . Diabetes Sister   . Colon cancer Neg Hx       Home Medications:   Prior to Admission medications   Medication Sig Start Date End Date Taking? Authorizing Provider  acetaminophen (TYLENOL 8 HOUR ARTHRITIS PAIN) 650 MG CR tablet Take 650 mg by mouth every 8 (eight) hours as needed for pain or fever.   Yes [provider]  aspirin EC 81 MG tablet Take 81 mg by mouth 2 (two) times daily.    Yes [provider]  benzonatate (TESSALON) 100 MG capsule Take 1 capsule (100 mg total) by mouth every 8 (eight) hours. 08/17/19  Yes Aberman, Merla Riches, PA-C  buPROPion (WELLBUTRIN SR) 100 MG 12 hr tablet Take 100 mg by mouth 2 (two) times daily.  03/07/19  Yes [provider]  Canagliflozin-metFORMIN HCl (INVOKAMET) [phone] MG TABS Take 1 tablet by mouth 2 (two) times daily.  04/17/19  Yes [provider]  celecoxib (CELEBREX) 200 MG capsule Take 1 capsule (200 mg total) by mouth daily. With food 05/20/19  Yes Stacks, Broadus Caidon, MD  Chlorphen-Phenyleph-ASA  (ALKA-SELTZER PLUS COLD) 2-7.8-325 MG TBEF Take 1 tablet by mouth daily as needed (for cold symptoms).   Yes [provider]  clopidogrel (PLAVIX) 75 MG tablet Take 75 mg by mouth daily.   Yes [provider]  escitalopram (LEXAPRO) 20 MG tablet Take 20 mg by mouth at bedtime.    Yes [provider]  famotidine (PEPCID) 20 MG tablet Take 1 tablet (20 mg total) by mouth 2 (two) times daily. Patient taking differently: Take 20 mg by mouth daily.  05/20/19  Yes Mechele Claude, MD  fish oil-omega-3 fatty acids 1000 MG capsule Take 1 g by mouth daily.    Yes [provider]  Insulin Degludec 200 UNIT/ML SOPN Inject 30 Units into the skin daily. Patient taking differently: Inject 30 Units into the skin every morning.  08/11/19  Yes Mechele Claude, MD  losartan (COZAAR) 100 MG tablet Take 100 mg by mouth every evening.  11/28/12  Yes [provider]  metoprolol succinate (TOPROL-XL) 25 MG 24 hr tablet Take 25 mg by mouth at bedtime.    Yes [provider]  Multiple Vitamin (MULITIVITAMIN WITH MINERALS) TABS Take 1 tablet by mouth daily.   Yes [provider]  ondansetron (ZOFRAN ODT) 4 MG disintegrating tablet Take 1 tablet (4 mg total) by mouth every 8 (eight) hours as needed for nausea or vomiting. 08/17/19  Yes Aberman, Merla Riches, PA-C  predniSONE (DELTASONE) 10 MG tablet Take 5 daily for 2 days followed by 4,3,2 and 1 for 2 days each. Patient taking differently: Take 10 mg by mouth See admin instructions. Take 5 daily for 2 days followed by 4,3,2 and 1 for 2 days each. 08/11/19  Yes Mechele Claude, MD  rosuvastatin (CRESTOR) 10 MG tablet Take 1 tablet (10 mg total) by mouth daily. For cholesterol 05/20/19  Yes Stacks, Broadus Jontay, MD  Semaglutide, 1 MG/DOSE, (OZEMPIC, 1 MG/DOSE,) 2 MG/1.5ML SOPN Inject 1 mg into the skin every Monday.  12/11/18  Yes  [provider]  Vitamin D, Ergocalciferol, (DRISDOL) 1.25 MG (50000 UT) CAPS capsule Take 1  capsule (50,000 Units total) by mouth every 7 (seven) days. 05/22/19 05/20/20 Yes Mechele Claude, MD     Allergies:     Allergies  Allergen Reactions  . Hydrocodone-Acetaminophen Swelling    Swelling of hands and feet followed by skin peeling off of hands and feet  . Hydrochlorothiazide Itching     Physical Exam:   Vitals  Blood pressure (!) 101/48, pulse 82, temperature (!) 100.5 F (38.1 C), temperature source Rectal, resp. rate (!) 27, weight 91.2 kg, SpO2 92 %.  1.  General: Lying in prone position in bed ill-appearing  2. Psychiatric: Pleasant and cooperative with exam  3. Neurologic: Cranial nerves II through XII grossly intact, moves all 4 extremities voluntarily, no focal deficit on limited exam  4. HEENMT:  Head is atraumatic normocephalic pupils are reactive to light EOMI trachea midline  5. Respiratory : Lungs are clear to auscultation bilaterally  6. Cardiovascular : Heart rate is tachycardic rhythm is regular  7. Gastrointestinal:  Abdomen is soft nondistended nontender to palpation  8. Skin:  No acute lesions on limited skin exam  9.Musculoskeletal:  No acute injury or deformity noted    Data Review:    CBC Recent Labs  Lab 08/17/19 1637 09-07-19 1411  WBC 4.7 6.4  HGB 15.5 14.2  HCT 46.7 43.6  PLT 148* 208  MCV 94.5 94.6  MCH 31.4 30.8  MCHC 33.2 32.6  RDW 13.2 13.6  LYMPHSABS 0.9 1.0  MONOABS 0.4 0.4  EOSABS 0.0 0.1  BASOSABS 0.0 0.0   ------------------------------------------------------------------------------------------------------------------  Results for orders placed or performed during the hospital encounter of September 07, 2019 (from the past 48 hour(s))  Lactic acid, plasma     Status: Abnormal   Collection Time: 09-07-19  2:10 PM  Result Value Ref Range   Lactic Acid, Venous 2.9 (HH) 0.5 - 1.9 mmol/L    Comment: CRITICAL RESULT CALLED TO, READ BACK BY AND VERIFIED WITH: WILEY,E ON 09/07/2019 AT 1530 BY LOY,C Performed at  [facility], [address], [place], [place] [address]   CBC WITH DIFFERENTIAL     Status: None   Collection Time: 09/07/19  2:11 PM  Result Value Ref Range   WBC 6.4 4.0 - 10.5 K/uL   RBC 4.61 4.22 - 5.81 MIL/uL   Hemoglobin 14.2 13.0 - 17.0 g/dL   HCT 60.4 54.0 - 98.1 %   MCV 94.6 80.0 - 100.0 fL   MCH 30.8 26.0 - 34.0 pg   MCHC 32.6 30.0 - 36.0 g/dL   RDW 19.1 47.8 - 29.5 %   Platelets 208 150 - 400 K/uL   nRBC 0.0 0.0 - 0.2 %   Neutrophils Relative % 76 %   Neutro Abs 4.8 1.7 - 7.7 K/uL   Lymphocytes Relative 15 %   Lymphs Abs 1.0 0.7 - 4.0 K/uL   Monocytes Relative 6 %   Monocytes Absolute 0.4 0.1 - 1.0 K/uL   Eosinophils Relative 2 %   Eosinophils Absolute 0.1 0.0 - 0.5 K/uL   Basophils Relative 0 %   Basophils Absolute 0.0 0.0 - 0.1 K/uL   Immature Granulocytes 1 %   Abs Immature Granulocytes 0.04 0.00 - 0.07 K/uL    Comment: Performed at [facility], [address] [place], [place] [address]  Comprehensive metabolic panel     Status: Abnormal   Collection Time: 09/07/2019  2:11 PM  Result Value Ref  Range   Sodium 136 135 - 145 mmol/L   Potassium 3.7 3.5 - 5.1 mmol/L   Chloride 97 (L) 98 - 111 mmol/L   CO2 18 (L) 22 - 32 mmol/L   Glucose, Bld 236 (H) 70 - 99 mg/dL   BUN 30 (H) 8 - 23 mg/dL   Creatinine, Ser 4.09 0.61 - 1.24 mg/dL   Calcium 8.1 (L) 8.9 - 10.3 mg/dL   Total Protein 7.0 6.5 - 8.1 g/dL   Albumin 3.0 (L) 3.5 - 5.0 g/dL   AST 811 (H) 15 - 41 U/L   ALT 68 (H) 0 - 44 U/L   Alkaline Phosphatase 73 38 - 126 U/L   Total Bilirubin 1.4 (H) 0.3 - 1.2 mg/dL   GFR calc non Af Amer >60 >60 mL/min   GFR calc Af Amer >60 >60 mL/min   Anion gap >20 (H) 5 - 15    Comment: Performed at [facility], [address], [place], [place] [address]  D-dimer, quantitative     Status: Abnormal   Collection Time: 07/26/2019  2:11 PM  Result Value Ref Range   D-Dimer, Quant 2.57 (H) 0.00 - 0.50 ug/mL-FEU    Comment: (NOTE) At the manufacturer cut-off of 0.50 ug/mL FEU, this  assay has been documented to exclude PE with a sensitivity and negative predictive value of 97 to 99%.  At this time, this assay has not been approved by the FDA to exclude DVT/VTE. Results should be correlated with clinical presentation. Performed at [facility], [address] [place], [place] [address]   Procalcitonin     Status: None   Collection Time: 08/17/2019  2:11 PM  Result Value Ref Range   Procalcitonin 0.60 ng/mL    Comment:        Interpretation: PCT > 0.5 ng/mL and <= 2 ng/mL: Systemic infection (sepsis) is possible, but other conditions are known to elevate PCT as well. (NOTE)       Sepsis PCT Algorithm           Lower Respiratory Tract                                      Infection PCT Algorithm    ----------------------------     ----------------------------         PCT < 0.25 ng/mL                PCT < 0.10 ng/mL         Strongly encourage             Strongly discourage   discontinuation of antibiotics    initiation of antibiotics    ----------------------------     -----------------------------       PCT 0.25 - 0.50 ng/mL            PCT 0.10 - 0.25 ng/mL               OR       >80% decrease in PCT            Discourage initiation of                                            antibiotics      Encourage discontinuation  of antibiotics    ----------------------------     -----------------------------         PCT >= 0.50 ng/mL              PCT 0.26 - 0.50 ng/mL                AND       <80% decrease in PCT             Encourage initiation of                                             antibiotics       Encourage continuation           of antibiotics    ----------------------------     -----------------------------        PCT >= 0.50 ng/mL                  PCT > 0.50 ng/mL               AND         increase in PCT                  Strongly encourage                                      initiation of antibiotics    Strongly encourage escalation            of antibiotics                                     -----------------------------                                           PCT <= 0.25 ng/mL                                                 OR                                        > 80% decrease in PCT                                     Discontinue / Do not initiate                                             antibiotics Performed at [facility], [address], [place], [place] [address]   Lactate dehydrogenase     Status: Abnormal   Collection Time: 08/10/2019  2:11 PM  Result Value Ref Range   LDH 704 (H) 98 - 192 U/L  Comment: Performed at [facility], [address], [place], [place] [address]  Fibrinogen     Status: Abnormal   Collection Time: 07/27/2019  2:11 PM  Result Value Ref Range   Fibrinogen 636 (H) 210 - 475 mg/dL    Comment: Performed at [facility], [address] [place], [place] [address]  Triglycerides     Status: Abnormal   Collection Time: 07/27/2019  2:12 PM  Result Value Ref Range   Triglycerides 186 (H) <150 mg/dL    Comment: Performed at [facility], [address] [place], [place] [address]  Beta-hydroxybutyric acid     Status: Abnormal   Collection Time: 07/22/2019  2:13 PM  Result Value Ref Range   Beta-Hydroxybutyric Acid 6.10 (H) 0.05 - 0.27 mmol/L    Comment: RESULTS CONFIRMED BY MANUAL DILUTION Performed at [facility], [address] [place], [place] [address]   Lactic acid, plasma     Status: Abnormal   Collection Time: 08/11/2019  4:04 PM  Result Value Ref Range   Lactic Acid, Venous 2.9 (HH) 0.5 - 1.9 mmol/L    Comment: CRITICAL VALUE NOTED.  VALUE IS CONSISTENT WITH PREVIOUSLY REPORTED AND CALLED VALUE. Performed at [facility], [address] [place], [place] [address]   Blood gas, venous (at [facility] and [facility], not at [facility])     Status: Abnormal   Collection Time: 07/23/2019  4:38 PM  Result Value Ref Range   FIO2 100.00    pH, Ven 7.424 7.250 - 7.430   pCO2, Ven 33.3 (L) 44.0 - 60.0 mmHg    pO2, Ven 38.2 32.0 - 45.0 mmHg   Bicarbonate 22.3 20.0 - 28.0 mmol/L   Acid-base deficit 2.3 (H) 0.0 - 2.0 mmol/L   O2 Saturation 66.9 %   Patient temperature 38.1     Comment: Performed at [facility], [address] [place], [place] [address]    Chemistries  Recent Labs  Lab 08/17/19 1637 08/08/2019 1411  NA 132* 136  K 3.9 3.7  CL 94* 97*  CO2 21* 18*  GLUCOSE 238* 236*  BUN 24* 30*  CREATININE 1.04 1.05  CALCIUM 8.1* 8.1*  AST 58* 139*  ALT 33 68*  ALKPHOS 64 73  BILITOT 1.3* 1.4*   ------------------------------------------------------------------------------------------------------------------  ------------------------------------------------------------------------------------------------------------------ GFR: Estimated Creatinine Clearance: 80.7 mL/min (by C-G formula based on SCr of 1.05 mg/dL). Liver Function Tests: Recent Labs  Lab 08/17/19 1637 07/22/2019 1411  AST 58* 139*  ALT 33 68*  ALKPHOS 64 73  BILITOT 1.3* 1.4*  PROT 7.6 7.0  ALBUMIN 3.6 3.0*   No results for input(s): LIPASE, AMYLASE in the last 168 hours. No results for input(s): AMMONIA in the last 168 hours. Coagulation Profile: No results for input(s): INR, PROTIME in the last 168 hours. Cardiac Enzymes: No results for input(s): CKTOTAL, CKMB, CKMBINDEX, TROPONINI in the last 168 hours. BNP (last 3 results) No results for input(s): PROBNP in the last 8760 hours. HbA1C: No results for input(s): HGBA1C in the last 72 hours. CBG: Recent Labs  Lab 08/17/19 1810  GLUCAP 218*   Lipid Profile: Recent Labs    08/04/2019 1412  TRIG 186*   Thyroid Function Tests: No results for input(s): TSH, T4TOTAL, FREET4, T3FREE, THYROIDAB in the last 72 hours. Anemia Panel: No results for input(s): VITAMINB12, FOLATE, FERRITIN, TIBC, IRON, RETICCTPCT in the last 72 hours.  --------------------------------------------------------------------------------------------------------------- Urine  analysis:    Component Value Date/Time   COLORURINE YELLOW 08/17/2019 1754   APPEARANCEUR CLEAR 08/17/2019 1754  LABSPEC 1.032 (H) 08/17/2019 1754   PHURINE 5.0 08/17/2019 1754   GLUCOSEU >=500 (A) 08/17/2019 1754   HGBUR SMALL (A) 08/17/2019 1754   BILIRUBINUR NEGATIVE 08/17/2019 1754   KETONESUR 80 (A) 08/17/2019 1754   PROTEINUR 100 (A) 08/17/2019 1754   NITRITE NEGATIVE 08/17/2019 1754   LEUKOCYTESUR NEGATIVE 08/17/2019 1754      Imaging Results:    DG Chest Port 1 View  Result Date: 2018-08-29 CLINICAL DATA:  Shortness of breath, hypoxia, COVID-19 positive EXAM: PORTABLE CHEST 1 VIEW COMPARISON:  08/17/2019 FINDINGS: Postsurgical changes to the sternum and mediastinum. Stable heart size. Increased patchy opacities within the bilateral lungs, right greater than left. Opacities are in a predominantly peripheral distribution. No large pleural fluid collection. No pneumothorax. IMPRESSION: Increased patchy bilateral airspace opacities suspicious for multifocal atypical/viral pneumonia. Electronically Signed   By: Duanne GuessNicholas  Plundo D.O.   On: 2018-08-29 13:22    My personal review of EKG: Rhythm NSR, Rate 93 /min, QTc 494 ,no Acute ST changes   Assessment & Plan:    Active Problems:   COVID-19   1. Acute hypoxic respiratory failure 1. Requiring 15 L nonrebreather 2. Secondary to Covid pneumonia 3. Start Decadron and remdesivir 4. [facility] 5. Encourage prone positioning 6. Continue albuterol and Atrovent inhalers 7. Continue to monitor pulse ox continuously 2. COVID-19 positive test (U07.1, COVID-19) with Acute Pneumonia (J12.89, Other viral pneumonia) 1. See plan above 2. Start Decadron remdesivir 3. Covid precautions 4. Continue to monitor 3. Sepsis secondary to Covid 1. Temperatures 100.5, respiratory rate is 22-39, lactic acidosis and transaminitis present 2. Avoiding 30 mils per kilogram fluid bolus given setting of Covid  pneumonia 3. 1 L normal saline given in ED 4. Continue IV fluids 5. Continue to monitor 4. Transaminitis 1. Likely a result of endorgan damage and inflammation in the setting of sepsis and Covid pneumonia 2. Hepatitis panel to r/o acute hyepatitis 3. Continue to trend with am CMP 4. AST 139, ALT 68, and T bili 1.4 5. Lactic acidosis 1. 2.9 2. 1L NS in ED 3. Continue 12525ml.hr 4. Continue to trend lactic acid 6. Protein calorie malnutrition 1. Likely 2/2 reduced PO intake in the setting of covid infection 2. Encourage nutrient dense food choices 3. Continue to monitor 7. Diabetes mellitus type 2    DVT Prophylaxis-   Heparin - SCDs   AM Labs Ordered, also please review Full Orders  Family Communication: No family at bedside Code Status:  Full  Admission status: Inpatient: Based on patients clinical presentation and evaluation of above clinical data, I have made determination that patient meets Inpatient criteria at this time.  Time spent in minutes : 67   Fariha Goto B Zierle-Ghosh M.D

## 2019-08-21 ENCOUNTER — Encounter (HOSPITAL_COMMUNITY): Payer: Self-pay | Admitting: Family Medicine

## 2019-08-21 ENCOUNTER — Other Ambulatory Visit: Payer: Self-pay

## 2019-08-21 DIAGNOSIS — J9601 Acute respiratory failure with hypoxia: Secondary | ICD-10-CM

## 2019-08-21 DIAGNOSIS — R0902 Hypoxemia: Secondary | ICD-10-CM

## 2019-08-21 DIAGNOSIS — I10 Essential (primary) hypertension: Secondary | ICD-10-CM

## 2019-08-21 DIAGNOSIS — U071 COVID-19: Secondary | ICD-10-CM

## 2019-08-21 LAB — CBC WITH DIFFERENTIAL/PLATELET
Abs Immature Granulocytes: 0.05 10*3/uL (ref 0.00–0.07)
Basophils Absolute: 0 10*3/uL (ref 0.0–0.1)
Basophils Relative: 0 %
Eosinophils Absolute: 0 10*3/uL (ref 0.0–0.5)
Eosinophils Relative: 0 %
HCT: 42 % (ref 39.0–52.0)
Hemoglobin: 14 g/dL (ref 13.0–17.0)
Immature Granulocytes: 1 %
Lymphocytes Relative: 11 %
Lymphs Abs: 0.5 10*3/uL — ABNORMAL LOW (ref 0.7–4.0)
MCH: 31.2 pg (ref 26.0–34.0)
MCHC: 33.3 g/dL (ref 30.0–36.0)
MCV: 93.5 fL (ref 80.0–100.0)
Monocytes Absolute: 0.2 10*3/uL (ref 0.1–1.0)
Monocytes Relative: 4 %
Neutro Abs: 4.2 10*3/uL (ref 1.7–7.7)
Neutrophils Relative %: 84 %
Platelets: 191 10*3/uL (ref 150–400)
RBC: 4.49 MIL/uL (ref 4.22–5.81)
RDW: 13.9 % (ref 11.5–15.5)
WBC: 5 10*3/uL (ref 4.0–10.5)
nRBC: 0 % (ref 0.0–0.2)

## 2019-08-21 LAB — ABO/RH: ABO/RH(D): O POS

## 2019-08-21 LAB — HEPATITIS PANEL, ACUTE
HCV Ab: NONREACTIVE
Hep A IgM: NONREACTIVE
Hep B C IgM: NONREACTIVE
Hepatitis B Surface Ag: NONREACTIVE

## 2019-08-21 LAB — GLUCOSE, CAPILLARY: Glucose-Capillary: 221 mg/dL — ABNORMAL HIGH (ref 70–99)

## 2019-08-21 LAB — COMPREHENSIVE METABOLIC PANEL
ALT: 51 U/L — ABNORMAL HIGH (ref 0–44)
AST: 104 U/L — ABNORMAL HIGH (ref 15–41)
Albumin: 2.6 g/dL — ABNORMAL LOW (ref 3.5–5.0)
Alkaline Phosphatase: 64 U/L (ref 38–126)
Anion gap: 20 — ABNORMAL HIGH (ref 5–15)
BUN: 29 mg/dL — ABNORMAL HIGH (ref 8–23)
CO2: 19 mmol/L — ABNORMAL LOW (ref 22–32)
Calcium: 7.7 mg/dL — ABNORMAL LOW (ref 8.9–10.3)
Chloride: 102 mmol/L (ref 98–111)
Creatinine, Ser: 0.93 mg/dL (ref 0.61–1.24)
GFR calc Af Amer: >60 mL/min (ref >60)
GFR calc non Af Amer: >60 mL/min (ref >60)
Glucose, Bld: 196 mg/dL — ABNORMAL HIGH (ref 70–99)
Potassium: 4.4 mmol/L (ref 3.5–5.1)
Sodium: 141 mmol/L (ref 135–145)
Total Bilirubin: 1.3 mg/dL — ABNORMAL HIGH (ref 0.3–1.2)
Total Protein: 6 g/dL — ABNORMAL LOW (ref 6.5–8.1)

## 2019-08-21 LAB — MRSA PCR SCREENING: MRSA by PCR: NEGATIVE

## 2019-08-21 MED ORDER — INSULIN DETEMIR 100 UNIT/ML ~~LOC~~ SOLN
20.0000 [IU] | Freq: Every day | SUBCUTANEOUS | Status: DC
  Administered 2019-08-21 – 2019-08-22 (×2): 20 [IU] via SUBCUTANEOUS
  Filled 2019-08-21 (×3): qty 0.2

## 2019-08-21 MED ORDER — INSULIN ASPART 100 UNIT/ML ~~LOC~~ SOLN
0.0000 [IU] | Freq: Three times a day (TID) | SUBCUTANEOUS | Status: DC
  Administered 2019-08-21: 5 [IU] via SUBCUTANEOUS
  Administered 2019-08-22 (×2): 8 [IU] via SUBCUTANEOUS
  Administered 2019-08-23: 3 [IU] via SUBCUTANEOUS
  Administered 2019-08-23: 08:00:00 2 [IU] via SUBCUTANEOUS

## 2019-08-21 MED ORDER — SODIUM CHLORIDE 0.9 % IV SOLN
2.0000 g | INTRAVENOUS | Status: AC
  Administered 2019-08-21 – 2019-08-25 (×5): 2 g via INTRAVENOUS
  Filled 2019-08-21 (×5): qty 20

## 2019-08-21 MED ORDER — CHLORHEXIDINE GLUCONATE CLOTH 2 % EX PADS
6.0000 | MEDICATED_PAD | Freq: Every day | CUTANEOUS | Status: DC
  Administered 2019-08-21 – 2019-09-12 (×21): 6 via TOPICAL

## 2019-08-21 MED ORDER — INSULIN ASPART 100 UNIT/ML ~~LOC~~ SOLN
0.0000 [IU] | Freq: Every day | SUBCUTANEOUS | Status: DC
  Administered 2019-08-21: 22:00:00 2 [IU] via SUBCUTANEOUS

## 2019-08-21 MED ORDER — ALBUTEROL SULFATE HFA 108 (90 BASE) MCG/ACT IN AERS
2.0000 | INHALATION_SPRAY | Freq: Four times a day (QID) | RESPIRATORY_TRACT | Status: DC
  Administered 2019-08-21 – 2019-08-23 (×8): 2 via RESPIRATORY_TRACT
  Filled 2019-08-21 (×2): qty 6.7

## 2019-08-21 MED ORDER — TOCILIZUMAB 400 MG/20ML IV SOLN
700.0000 mg | Freq: Once | INTRAVENOUS | Status: AC
  Administered 2019-08-21: 700 mg via INTRAVENOUS
  Filled 2019-08-21: qty 35

## 2019-08-21 MED ORDER — INSULIN ASPART 100 UNIT/ML ~~LOC~~ SOLN
4.0000 [IU] | Freq: Three times a day (TID) | SUBCUTANEOUS | Status: DC
  Administered 2019-08-21 – 2019-08-23 (×5): 4 [IU] via SUBCUTANEOUS

## 2019-08-21 MED ORDER — SODIUM CHLORIDE 0.9% IV SOLUTION: Freq: Once | INTRAVENOUS | Status: AC

## 2019-08-21 MED ORDER — SODIUM CHLORIDE 0.9 % IV SOLN
500.0000 mg | INTRAVENOUS | Status: AC
  Administered 2019-08-21 – 2019-08-25 (×5): 500 mg via INTRAVENOUS
  Filled 2019-08-21 (×5): qty 500

## 2019-08-21 NOTE — Plan of Care (Addendum)
Patient having labored breathing and maxed out on HFNC and Non-rebreather, MD notified. Patient transferred to ICU. Bedside report given to [facility], Therapist, sports.  Problem: Education: Goal: Knowledge of General Education information will improve Description: Including pain rating scale, medication(s)/side effects and non-pharmacologic comfort measures 08/21/2019 0847 by Orvan Falconer, RN Outcome: Progressing 08/21/2019 0847 by Orvan Falconer, RN Outcome: Progressing   Problem: Health Behavior/Discharge Planning: Goal: Ability to manage health-related needs will improve 08/21/2019 0847 by Orvan Falconer, RN Outcome: Progressing 08/21/2019 0847 by Orvan Falconer, RN Outcome: Progressing   Problem: Clinical Measurements: Goal: Ability to maintain clinical measurements within normal limits will improve 08/21/2019 0847 by Orvan Falconer, RN Outcome: Progressing 08/21/2019 0847 by Orvan Falconer, RN Outcome: Progressing Goal: Will remain free from infection 08/21/2019 0847 by Orvan Falconer, RN Outcome: Progressing 08/21/2019 0847 by Orvan Falconer, RN Outcome: Progressing Goal: Diagnostic test results will improve 08/21/2019 0847 by Orvan Falconer, RN Outcome: Progressing 08/21/2019 0847 by Orvan Falconer, RN Outcome: Progressing Goal: Respiratory complications will improve 08/21/2019 0847 by Orvan Falconer, RN Outcome: Progressing 08/21/2019 0847 by Orvan Falconer, RN Outcome: Progressing Goal: Cardiovascular complication will be avoided 08/21/2019 0847 by Orvan Falconer, RN Outcome: Progressing 08/21/2019 0847 by Orvan Falconer, RN Outcome: Progressing   Problem: Activity: Goal: Risk for activity intolerance will decrease 08/21/2019 0847 by Orvan Falconer, RN Outcome: Progressing 08/21/2019 0847 by Orvan Falconer, RN Outcome: Progressing   Problem: Nutrition: Goal: Adequate nutrition will be maintained 08/21/2019  0847 by Orvan Falconer, RN Outcome: Progressing 08/21/2019 0847 by Orvan Falconer, RN Outcome: Progressing   Problem: Coping: Goal: Level of anxiety will decrease 08/21/2019 0847 by Orvan Falconer, RN Outcome: Progressing 08/21/2019 0847 by Orvan Falconer, RN Outcome: Progressing   Problem: Elimination: Goal: Will not experience complications related to bowel motility 08/21/2019 0847 by Orvan Falconer, RN Outcome: Progressing 08/21/2019 0847 by Orvan Falconer, RN Outcome: Progressing Goal: Will not experience complications related to urinary retention 08/21/2019 0847 by Orvan Falconer, RN Outcome: Progressing 08/21/2019 0847 by Orvan Falconer, RN Outcome: Progressing   Problem: Pain Managment: Goal: General experience of comfort will improve 08/21/2019 0847 by Orvan Falconer, RN Outcome: Progressing 08/21/2019 0847 by Orvan Falconer, RN Outcome: Progressing   Problem: Safety: Goal: Ability to remain free from injury will improve 08/21/2019 0847 by Orvan Falconer, RN Outcome: Progressing 08/21/2019 0847 by Orvan Falconer, RN Outcome: Progressing   Problem: Skin Integrity: Goal: Risk for impaired skin integrity will decrease 08/21/2019 0847 by Orvan Falconer, RN Outcome: Progressing 08/21/2019 0847 by Orvan Falconer, RN Outcome: Progressing

## 2019-08-21 NOTE — Progress Notes (Signed)
Pt's daughter did a facechat time with pt.

## 2019-08-21 NOTE — Progress Notes (Signed)
Assisted tele visit to patient with daughter.  Yenesis Even Ann, RN  

## 2019-08-21 NOTE — Consult Note (Signed)
NAME:  [name] [name], MRN:  [id number], DOB:  June 22, 1956, LOS: 1 ADMISSION DATE:  08-29-19, CONSULTATION DATE: 08/21/2019 REFERRING MD: Dr. Waldron Labs, CHIEF COMPLAINT: Hypoxemic respiratory failure  Brief History   63 year old man with CAD, hypertension, diabetes, OSA.  Admitted with COVID-19 pneumonia and hypoxemic respiratory failure 12/30.  History of present illness   63 year old man with hypertension, hyperlipidemia, CAD, diabetes, OSA.  Admitted 12/30 with a week of flulike symptoms, was COVID-19 positive 12/27.  Noted to have hypoxemic respiratory failure and bilateral patchy pulmonary infiltrates, transaminitis, elevated inflammatory markers on arrival to [facility] ED.  Started dexamethasone, remdesivir.  He has experienced tachypnea, transient desaturations, moved for ICU monitoring requiring 1.00 NRB plus HHF [place].   Past Medical History   has a past medical history of Chest pain, Coronary artery disease, Diabetes mellitus without complication ([place]), Dyspnea, Hyperlipidemia, Hypertension, Right bundle branch block, and Sleep apnea.   Significant Hospital Events   Admit 12/30 To ICU on 1.00 NRB, HHFNC  Consults:  PCCM 12/31  Procedures:    Significant Diagnostic Tests:  Chest x-ray 12/30 >> scattered patchy bilateral pulmonary infiltrates  Micro Data:  SARS CoV 2 12/27 >> positive MRSA screen 12/31 >> negative Blood 12/30 >>  Antimicrobials:  Remdesivir 12/30 >> Tocilizumab x1, 12/30 Azithromycin 12/31 >>  Ceftriaxone 12/31 >>   Interim history/subjective:  Moved to ICU for closer monitoring 12/31 Denies dyspnea.  He is thirsty, wants something to drink or to swab his mouth  Objective   Blood pressure (!) 145/58, pulse 85, temperature (!) 97 F (36.1 C), temperature source Axillary, resp. rate (!) 31, height 5\' 10"  (1.778 m), weight 88 kg, SpO2 90 %.        Intake/Output Summary (Last 24 hours) at 08/21/2019 1633 Last data filed at 08/21/2019 7829 Gross  per 24 hour  Intake 2682.34 ml  Output 600 ml  Net 2082.34 ml   Filed Weights   08-29-2019 1249 29-Aug-2019 2134  Weight: 91.2 kg 88 kg    Examination: General: Ill-appearing man, tired, in no distress HENT: Oropharynx a bit dry, pupils equal, no lymphadenopathy Lungs: Bilateral inspiratory crackles, no wheezing Cardiovascular: Regular, distant, no murmur Abdomen: Soft, nondistended with positive bowel sounds Extremities: No significant edema Neuro: Awake, able to interact, follows commands, answers questions appropriately GU: Deferred  Resolved Hospital Problem list     Assessment & Plan:  Acute hypoxemic respiratory failure due to COVID-19 pneumonia/pneumonitis.  He has a high oxygen need and labile respiratory status earlier in the day.  Now looks to be stabilizing but on 1.00 NRB plus HHFNC.  Certainly at risk for progression and the need for intubation with mechanical ventilation.    Recs: Agree with transfer to ICU for closer monitoring. Remdesivir, dexamethasone as ordered Agree with empiric addition of azithromycin, ceftriaxone to cover for possible superimposed bacterial CAP Would encourage prone positioning as he is able to tolerate Favor negative fluid balance if possible, currently 2 L positive for the hospitalization.  I will KVO his IV fluids for now PCCM will follow peripherally  Best practice:  Diet: Heart healthy Pain/Anxiety/Delirium protocol (if indicated): N/A VAP protocol (if indicated): N/A DVT prophylaxis: Heparin subcut GI prophylaxis: Pepcid Glucose control: Sliding scale insulin, Levemir Mobility: Bedrest Code Status: Full Family Communication: Discussed with patient 12/31 Disposition: ICU  Labs   CBC: Recent Labs  Lab 08/17/19 1637 2019/08/29 1411 08/21/19 0210  WBC 4.7 6.4 5.0  NEUTROABS 3.4 4.8 4.2  HGB 15.5 14.2 14.0  HCT  46.7 43.6 42.0  MCV 94.5 94.6 93.5  PLT 148* 208 191    Basic Metabolic Panel: Recent Labs  Lab 08/17/19 1637  08-31-19 1411 08/21/19 0210  NA 132* 136 141  K 3.9 3.7 4.4  CL 94* 97* 102  CO2 21* 18* 19*  GLUCOSE 238* 236* 196*  BUN 24* 30* 29*  CREATININE 1.04 1.05 0.93  CALCIUM 8.1* 8.1* 7.7*   GFR: Estimated Creatinine Clearance: 89.7 mL/min (by C-G formula based on SCr of 0.93 mg/dL). Recent Labs  Lab 08/17/19 1637 08/17/19 1824 08/31/19 1410 August 31, 2019 1411 08-31-2019 1604 08/21/19 0210  PROCALCITON 0.18  --   --  0.60  --   --   WBC 4.7  --   --  6.4  --  5.0  LATICACIDVEN 2.5* 2.2* 2.9*  --  2.9*  --     Liver Function Tests: Recent Labs  Lab 08/17/19 1637 08-31-19 1411 08/21/19 0210  AST 58* 139* 104*  ALT 33 68* 51*  ALKPHOS 64 73 64  BILITOT 1.3* 1.4* 1.3*  PROT 7.6 7.0 6.0*  ALBUMIN 3.6 3.0* 2.6*   No results for input(s): LIPASE, AMYLASE in the last 168 hours. No results for input(s): AMMONIA in the last 168 hours.  ABG    Component Value Date/Time   HCO3 22.3 August 31, 2019 1638   TCO2 20 01/30/2015 1808   ACIDBASEDEF 2.3 (H) August 31, 2019 1638   O2SAT 66.9 08-31-2019 1638     Coagulation Profile: No results for input(s): INR, PROTIME in the last 168 hours.  Cardiac Enzymes: No results for input(s): CKTOTAL, CKMB, CKMBINDEX, TROPONINI in the last 168 hours.  HbA1C: HB A1C (BAYER DCA - WAIVED)  Date/Time Value Ref Range Status  08/11/2019 08:04 AM 8.3 (H) <7.0 % Final    Comment:                                          Diabetic Adult            <7.0                                       Healthy Adult        4.3 - 5.7                                                           (DCCT/NGSP) American Diabetes Association's Summary of Glycemic Recommendations for Adults with Diabetes: Hemoglobin A1c <7.0%. More stringent glycemic goals (A1c <6.0%) may further reduce complications at the cost of increased risk of hypoglycemia.   05/20/2019 09:09 AM 6.7 <7.0 % Final    Comment:                                          Diabetic Adult            <7.0  Healthy Adult        4.3 - 5.7                                                           (DCCT/NGSP) American Diabetes Association's Summary of Glycemic Recommendations for Adults with Diabetes: Hemoglobin A1c <7.0%. More stringent glycemic goals (A1c <6.0%) may further reduce complications at the cost of increased risk of hypoglycemia.     CBG: Recent Labs  Lab 08/17/19 1810  GLUCAP 218*    Review of Systems:   As per HPI  Past Medical History  He,  has a past medical history of Chest pain, Coronary artery disease, Diabetes mellitus without complication (HCC), Dyspnea, Hyperlipidemia, Hypertension, Right bundle branch block, and Sleep apnea.   Surgical History    Past Surgical History:  Procedure Laterality Date  . ADULT ECHOCARDIOGRAPHY  11/08/2004   The left ventricle is normal size .There is normal left ventricular wall thickness .EJECTION FRACTION =>55%.The LA is mildly dilated .The main PA is imaged to the bicfucation into right and left branches with normal size noted.Based on an assumed RA pressure orf 10 and a end diastolic PR gradient of 1.2 m/sec the PA end diastolic pressure is estimated to be ~16 which is top normal  . CARDIAC CATHETERIZATION  01 /21/2011    REVEALING 75 % IN - STENT RESTENOSIS WITHIN  THE STENTED SEGMENT WHICH I RESTENTED WITH A DRUG ELUTING STENT   . CARDIAC CATHETERIZATION  04/ 24 /2013   THAT SHOWED 95 % IN STENT RESTENOSIS WITHIN THE PREVIOUSLY RESTENTED SEGMENT . iI ELECTED TO TREAT HIM MEDICALLY/ PT ASYMPTOMATIC SINCE  . CARDIOVASCULAR STRESS TEST  SEPT 10 2010   NEW LATERAL ISCHEMIA  . CARDIOVASCULAR STRESS TEST  November 01 2009   SHOWED RESOULTION OF HIS ISCHEMIA ABNORMALITY. PT WAS ASYMPTOMATIC  AFTER THAT  . CARDIOVASCULAR STRESS TEST  December 08 2011   MYOVIEW SHOWED NEW ANTEROLATERAL ISCHEMIA (LED TO A CATH)  . Carotid Doppler  11 /23/2013   [place] -Bilateral Bulb ICA's demonstrated a mild amount of fibrous  plaquewith no evidence of significant diameter  reduction ,tortuosity or any other vascular abnormality. This is midly abnormal carotid duple doppler eval. When compared to the previous study dated 08/12/2007,there are no significant changes noted.Noted it is recommend that this test be repeated when clinically indicated aque   . COLONOSCOPY  11/2007   Dr. Jena Gauss: normal. repeat 11/2017.  Marland Kitchen ESOPHAGEAL DILATION N/A 04/16/2015   Procedure: ESOPHAGEAL DILATION;  Surgeon: Corbin Ade, MD;  Location: [facility];  Service: Endoscopy;  Laterality: N/A;  . ESOPHAGOGASTRODUODENOSCOPY N/A 01/30/2015   Dr. Charlott Rakes: food impaction-foreign body removal, distal esophageal stricture, ulcerated esophagus due to food impaction.   . ESOPHAGOGASTRODUODENOSCOPY  11/2007   Dr. Jena Gauss: Schatzki ring s/p dilation, small hh  . ESOPHAGOGASTRODUODENOSCOPY  10/2007   Dr. Carman Ching: food impaction, friable esophageal ring  . ESOPHAGOGASTRODUODENOSCOPY N/A 04/16/2015   RMR: Distal tandem esophageal ring status pos Maloney dilation. Abnormal distal esophagus. Status post esophageal biopsy. Hiatal hernia. Abnormal gastric mucosa of uncertain significance Status post gastric biopsy.  Marland Kitchen LEFT HEART CATHETERIZATION WITH CORONARY ANGIOGRAM N/A 12/13/2011   Procedure: LEFT HEART CATHETERIZATION WITH CORONARY ANGIOGRAM;  Surgeon: Runell Gess, MD;  Location: [facility] CATH LAB;  Service: Cardiovascular;  Laterality: N/A;  . sleep  study report  05/03/2007   The patient has undergone a diagnostic polysomnogram which confirmed the diagnosis of sleep disordered breathing with an AHI of 5.4/hr and 24.91/hr during REM sleep  . SLEEP STUDY  07/ 06/2007   The (AHI) formerly known as RDI , during the total sleep time  6 h 17 min  was 5.41/ hr and during Rem sleep at 24.91/hr .This places the patient into the mild sleep apnea category . The average oxygen saturation range   . SLEEP STUDY  07 11 2008   (cont-) range during REM was 91 %  The lowest oxygen saturation during NON-REM and REM sleep was 86% and 87 % respectively . The patient was obsered snoring loudly     Social History   reports that he quit smoking about 24 years ago. He has never used smokeless tobacco. He reports that he does not drink alcohol or use drugs.   Family History   His family history includes Diabetes in his brother, mother, and sister. There is no history of Colon cancer.   Allergies Allergies  Allergen Reactions  . Hydrocodone-Acetaminophen Swelling    Swelling of hands and feet followed by skin peeling off of hands and feet  . Hydrochlorothiazide Itching     Home Medications  Prior to Admission medications   Medication Sig Start Date End Date Taking? Authorizing Provider  acetaminophen (TYLENOL 8 HOUR ARTHRITIS PAIN) 650 MG CR tablet Take 650 mg by mouth every 8 (eight) hours as needed for pain or fever.   Yes [provider]  aspirin EC 81 MG tablet Take 81 mg by mouth 2 (two) times daily.    Yes [provider]  benzonatate (TESSALON) 100 MG capsule Take 1 capsule (100 mg total) by mouth every 8 (eight) hours. 08/17/19  Yes Aberman, Merla Richesaroline C, PA-C  buPROPion (WELLBUTRIN SR) 100 MG 12 hr tablet Take 100 mg by mouth 2 (two) times daily.  03/07/19  Yes [provider]  Canagliflozin-metFORMIN HCl (INVOKAMET) [phone] MG TABS Take 1 tablet by mouth 2 (two) times daily.  04/17/19  Yes [provider]  celecoxib (CELEBREX) 200 MG capsule Take 1 capsule (200 mg total) by mouth daily. With food 05/20/19  Yes Stacks, Broadus JohnWarren, MD  Chlorphen-Phenyleph-ASA (ALKA-SELTZER PLUS COLD) 2-7.8-325 MG TBEF Take 1 tablet by mouth daily as needed (for cold symptoms).   Yes [provider]  clopidogrel (PLAVIX) 75 MG tablet Take 75 mg by mouth daily.   Yes [provider]  escitalopram (LEXAPRO) 20 MG tablet Take 20 mg by mouth at bedtime.    Yes [provider]  famotidine (PEPCID) 20 MG tablet Take  1 tablet (20 mg total) by mouth 2 (two) times daily. Patient taking differently: Take 20 mg by mouth daily.  05/20/19  Yes Mechele ClaudeStacks, Warren, MD  fish oil-omega-3 fatty acids 1000 MG capsule Take 1 g by mouth daily.    Yes [provider]  Insulin Degludec 200 UNIT/ML SOPN Inject 30 Units into the skin daily. Patient taking differently: Inject 30 Units into the skin every morning.  08/11/19  Yes Mechele ClaudeStacks, Warren, MD  losartan (COZAAR) 100 MG tablet Take 100 mg by mouth every evening.  11/28/12  Yes [provider]  metoprolol succinate (TOPROL-XL) 25 MG 24 hr tablet Take 25 mg by mouth at bedtime.    Yes [provider]  Multiple Vitamin (MULITIVITAMIN WITH MINERALS) TABS Take 1 tablet by mouth daily.  Yes [provider]  ondansetron (ZOFRAN ODT) 4 MG disintegrating tablet Take 1 tablet (4 mg total) by mouth every 8 (eight) hours as needed for nausea or vomiting. 08/17/19  Yes Aberman, Merla Riches, PA-C  predniSONE (DELTASONE) 10 MG tablet Take 5 daily for 2 days followed by 4,3,2 and 1 for 2 days each. Patient taking differently: Take 10 mg by mouth See admin instructions. Take 5 daily for 2 days followed by 4,3,2 and 1 for 2 days each. 08/11/19  Yes Mechele Claude, MD  rosuvastatin (CRESTOR) 10 MG tablet Take 1 tablet (10 mg total) by mouth daily. For cholesterol 05/20/19  Yes Stacks, Broadus Shandell, MD  Semaglutide, 1 MG/DOSE, (OZEMPIC, 1 MG/DOSE,) 2 MG/1.5ML SOPN Inject 1 mg into the skin every Monday.  12/11/18  Yes [provider]  Vitamin D, Ergocalciferol, (DRISDOL) 1.25 MG (50000 UT) CAPS capsule Take 1 capsule (50,000 Units total) by mouth every 7 (seven) days. 05/22/19 05/20/20 Yes Mechele Claude, MD     Critical care time: 70     [name], MD, PhD 08/21/2019, 4:52 PM [facility] [phone] or if no answer [phone]

## 2019-08-21 NOTE — Progress Notes (Signed)
PROGRESS NOTE                                                                                                                                                                                                             Patient Demographics:    [name] [name], is a 63 y.o. male, DOB - 03/13/1956, JXB:[id number]  Admit date - 07/31/2019   Admitting Physician [name] Gilford, DO  Outpatient Primary MD for the patient is [name] Claude, MD  LOS - 1   Chief Complaint  Patient presents with  . COVID positive       Brief Narrative    63 y.o. male, with history of hypertension, hyperlipidemia, diabetes mellitus, CAD, and sleep apnea who presents to the hospital with a c/c of shortness of breath.   Patient tested positive for Covid 19, noted to be hypoxic requiring 15 L high flow nasal cannula and NRB, transferred to [facility] for further management.  Patient with increased work of breathing, and with increased oxygen requirement, he was transferred to ICU 12/31 for need of heated high flow nasal cannula.   Subjective:    [name] today ports cough, dyspnea, poor appetite, generalized weakness .   Assessment  & Plan :    Active Problems:   COVID-19  Acute hypoxic respiratory failure due to COVID-19 pneumonia -Chest x-ray significant for bilateral opacities, patient with increased oxygen requirement, currently he has been transferred to ICU for worsening hypoxia despite being on NRB and 15 L high flow nasal cannula, so now he is being transitioned to heated high flow and NRB. -Received Actemra 12/31 -Continue with IV steroids -Continue with IV remdesivir. -Convulsant plasma with the patient, he is agreeable, will proceed with transfusion. -Tinea to trend inflammatory markers closely -Discussed at length with the patient, it was encouraged with incentive spirometry, flutter valve, and to prone  COVID-19 Labs  Recent Labs    08/18/2019 1411  08/02/2019 1412  DDIMER 2.57*  --   FERRITIN  --  2,287*  LDH 704*  --   CRP  --  12.1*    No results found for: SARSCOV2NAA  Transaminitis -Secondary to COVID-19, continue to monitor closely especially he is on remdesivir  Diabetes mellitus type 2 -Check hemoglobin A1c -We will start on insulin sliding scale, 4 units before meals and Levemir 20  units subcu daily.  Hypertension -Resume home medications  CAD -Continue with Plavix, statin and beta-blocker  Hyperlipidemia -Continue with home dose statin     Code Status : Full  Family Communication  : Discussed with wife via phone  Disposition Plan  : Transfer to ICU  Barriers For Discharge : High oxygen requirement  Consults  : None  Procedures  : None  DVT Prophylaxis  : Subcu Lovenox  Lab Results  Component Value Date   PLT 191 08/21/2019    Antibiotics  :    Anti-infectives (From admission, onward)   Start     Dose/Rate Route Frequency Ordered Stop   08/21/19 1000  remdesivir 100 mg in sodium chloride 0.9 % 100 mL IVPB     100 mg 200 mL/hr over 30 Minutes Intravenous Daily 08/12/2019 1742 08/25/19 0959   08/21/19 0815  cefTRIAXone (ROCEPHIN) 2 g in sodium chloride 0.9 % 100 mL IVPB     2 g 200 mL/hr over 30 Minutes Intravenous Every 24 hours 08/21/19 0810     08/21/19 0815  azithromycin (ZITHROMAX) 500 mg in sodium chloride 0.9 % 250 mL IVPB     500 mg 250 mL/hr over 60 Minutes Intravenous Every 24 hours 08/21/19 0810     08/01/2019 1745  remdesivir 200 mg in sodium chloride 0.9% 250 mL IVPB     200 mg 580 mL/hr over 30 Minutes Intravenous Once 08/13/2019 1742 08/05/2019 2045        Objective:   Vitals:   08/21/19 0721 08/21/19 0800 08/21/19 1158 08/21/19 1200  BP: (!) 124/59 121/60 (!) 131/59 (!) 145/58  Pulse: 73 72 85 85  Resp: (!) 28 (!) 33 (!) 40 (!) 31  Temp: 98.9 F (37.2 C)  (!) 97 F (36.1 C)   TempSrc: Axillary  Axillary   SpO2: 90% (!) 88% 91% 90%  Weight:      Height:        Wt  Readings from Last 3 Encounters:  07/22/2019 88 kg  08/17/19 91.2 kg  08/11/19 92.1 kg     Intake/Output Summary (Last 24 hours) at 08/21/2019 1609 Last data filed at 08/21/2019 0948 Gross per 24 hour  Intake 2682.34 ml  Output 600 ml  Net 2082.34 ml     Physical Exam  Awake Alert, Oriented X 3, No new F.N deficits, Normal affect Symmetrical Chest wall movement, Good air movement bilaterally, mildly tachypneic, scattered rales. RRR,No Gallops,Rubs or new Murmurs, No Parasternal Heave +ve B.Sounds, Abd Soft, No tenderness,  No rebound - guarding or rigidity. No Cyanosis, Clubbing or edema, No new Rash or bruise      Data Review:    CBC Recent Labs  Lab 08/17/19 1637 07/28/2019 1411 08/21/19 0210  WBC 4.7 6.4 5.0  HGB 15.5 14.2 14.0  HCT 46.7 43.6 42.0  PLT 148* 208 191  MCV 94.5 94.6 93.5  MCH 31.4 30.8 31.2  MCHC 33.2 32.6 33.3  RDW 13.2 13.6 13.9  LYMPHSABS 0.9 1.0 0.5*  MONOABS 0.4 0.4 0.2  EOSABS 0.0 0.1 0.0  BASOSABS 0.0 0.0 0.0    Chemistries  Recent Labs  Lab 08/17/19 1637 08/16/2019 1411 08/21/19 0210  NA 132* 136 141  K 3.9 3.7 4.4  CL 94* 97* 102  CO2 21* 18* 19*  GLUCOSE 238* 236* 196*  BUN 24* 30* 29*  CREATININE 1.04 1.05 0.93  CALCIUM 8.1* 8.1* 7.7*  AST 58* 139* 104*  ALT 33 68* 51*  ALKPHOS 64 73 64  BILITOT 1.3* 1.4* 1.3*   ------------------------------------------------------------------------------------------------------------------ Recent Labs    08/19/2019 1412  TRIG 186*    Lab Results  Component Value Date   HGBA1C 8.3 (H) 08/11/2019   ------------------------------------------------------------------------------------------------------------------ No results for input(s): TSH, T4TOTAL, T3FREE, THYROIDAB in the last 72 hours.  Invalid input(s): FREET3 ------------------------------------------------------------------------------------------------------------------ Recent Labs    07/27/2019 1412  FERRITIN 2,287*     Coagulation profile No results for input(s): INR, PROTIME in the last 168 hours.  Recent Labs    08/09/2019 1411  DDIMER 2.57*    Cardiac Enzymes No results for input(s): CKMB, TROPONINI, MYOGLOBIN in the last 168 hours.  Invalid input(s): CK ------------------------------------------------------------------------------------------------------------------ No results found for: BNP  Inpatient Medications  Scheduled Meds: . albuterol  2 puff Inhalation Q6H  . aspirin EC  81 mg Oral BID  . buPROPion  100 mg Oral BID  . Chlorhexidine Gluconate Cloth  6 each Topical Daily  . clopidogrel  75 mg Oral Daily  . escitalopram  20 mg Oral QHS  . famotidine  20 mg Oral Daily  . heparin  5,000 Units Subcutaneous Q8H  . ipratropium  2 puff Inhalation Q6H  . metoprolol succinate  25 mg Oral QHS  . rosuvastatin  10 mg Oral Daily  . senna  1 tablet Oral BID   Continuous Infusions: . sodium chloride 125 mL/hr at 08/21/19 0651  . azithromycin 500 mg (08/21/19 0921)  . cefTRIAXone (ROCEPHIN)  IV 2 g (08/21/19 0830)  . remdesivir 100 mg in NS 100 mL 100 mg (08/21/19 1156)   PRN Meds:.acetaminophen, guaiFENesin-dextromethorphan, ondansetron **OR** ondansetron (ZOFRAN) IV, traMADol  Micro Results Recent Results (from the past 240 hour(s))  Blood Culture (routine x 2)     Status: None (Preliminary result)   Collection Time: 08/17/19  4:51 PM   Specimen: BLOOD RIGHT ARM  Result Value Ref Range Status   Specimen Description BLOOD RIGHT ARM  Final   Special Requests   Final    BOTTLES DRAWN AEROBIC AND ANAEROBIC Blood Culture adequate volume   Culture   Final    NO GROWTH 4 DAYS Performed at [facility], [address] [place], [place] [address]    Report Status PENDING  Incomplete  Blood Culture (routine x 2)     Status: None (Preliminary result)   Collection Time: 08/17/19  5:00 PM   Specimen: Left Antecubital; Blood  Result Value Ref Range Status   Specimen Description LEFT  ANTECUBITAL  Final   Special Requests   Final    BOTTLES DRAWN AEROBIC AND ANAEROBIC Blood Culture adequate volume   Culture   Final    NO GROWTH 4 DAYS Performed at [facility], [address] [place], [place] [address]    Report Status PENDING  Incomplete  Blood Culture (routine x 2)     Status: None (Preliminary result)   Collection Time: 08/03/2019  2:12 PM   Specimen: BLOOD LEFT ARM  Result Value Ref Range Status   Specimen Description BLOOD LEFT ARM BOTTLES DRAWN AEROBIC ONLY  Final   Special Requests   Final    Blood Culture results may not be optimal due to an inadequate volume of blood received in culture bottles   Culture   Final    NO GROWTH < 24 HOURS Performed at [facility], [address] [place], [place] [address]    Report Status PENDING  Incomplete  Blood Culture (routine x 2)     Status: None (Preliminary result)   Collection Time:  07/23/2019  2:13 PM   Specimen: BLOOD RIGHT ARM  Result Value Ref Range Status   Specimen Description BLOOD RIGHT ARM  Final   Special Requests   Final    Blood Culture adequate volume BOTTLES DRAWN AEROBIC AND ANAEROBIC   Culture   Final    NO GROWTH < 24 HOURS Performed at [facility], [address] [place], [place] [address]    Report Status PENDING  Incomplete  MRSA PCR Screening     Status: None   Collection Time: 08/21/19  1:27 AM   Specimen: Nasal Mucosa; Nasopharyngeal  Result Value Ref Range Status   MRSA by PCR NEGATIVE NEGATIVE Final    Comment:        The GeneXpert MRSA Assay (FDA approved for NASAL specimens only), is one component of a comprehensive MRSA colonization surveillance program. It is not intended to diagnose MRSA infection nor to guide or monitor treatment for MRSA infections. Performed at [facility], [employer] [address] [place], [place] [address]     Radiology Reports DG Chest [place] 1 View  Result Date: 08/13/2019 CLINICAL DATA:  Shortness of breath, hypoxia, COVID-19 positive EXAM:  PORTABLE CHEST 1 VIEW COMPARISON:  08/17/2019 FINDINGS: Postsurgical changes to the sternum and mediastinum. Stable heart size. Increased patchy opacities within the bilateral lungs, right greater than left. Opacities are in a predominantly peripheral distribution. No large pleural fluid collection. No pneumothorax. IMPRESSION: Increased patchy bilateral airspace opacities suspicious for multifocal atypical/viral pneumonia. Electronically Signed   By: Davina Poke D.O.   On: 08/11/2019 13:22   DG Chest Port 1 View  Result Date: 08/17/2019 CLINICAL DATA:  63 year old male with shortness of breath. Positive COVID-19. EXAM: PORTABLE CHEST 1 VIEW COMPARISON:  Chest radiograph dated 01/30/2015. FINDINGS: There is diffuse interstitial and vascular prominence most likely mild vascular congestion. Minimal hazy density in the mid to lower lung field, likely combination of vascular congestion and atelectasis. Atypical infection is less likely but not excluded. Clinical correlation is recommended. No focal consolidation, pleural effusion, or pneumothorax. Top-normal cardiac size. Median sternotomy wires and CABG vascular clips. Atherosclerotic calcification of the aorta. No acute osseous pathology. IMPRESSION: 1. Mild vascular congestion. 2. Minimal hazy density in the mid to lower lung field, likely combination of vascular congestion and atelectasis. Atypical infection is less likely but not excluded. No focal consolidation. Electronically Signed   By: Anner Crete M.D.   On: 08/17/2019 16:49     Phillips Climes M.D on 08/21/2019 at 4:09 PM  Between 7am to 7pm - Pager - [phone]  After 7pm go to [email or web address] - password [facility]  [facility] -  Office  [phone]

## 2019-08-22 LAB — COMPREHENSIVE METABOLIC PANEL
ALT: 47 U/L — ABNORMAL HIGH (ref 0–44)
AST: 79 U/L — ABNORMAL HIGH (ref 15–41)
Albumin: 2.7 g/dL — ABNORMAL LOW (ref 3.5–5.0)
Alkaline Phosphatase: 73 U/L (ref 38–126)
Anion gap: 14 (ref 5–15)
BUN: 34 mg/dL — ABNORMAL HIGH (ref 8–23)
CO2: 20 mmol/L — ABNORMAL LOW (ref 22–32)
Calcium: 7.9 mg/dL — ABNORMAL LOW (ref 8.9–10.3)
Chloride: 110 mmol/L (ref 98–111)
Creatinine, Ser: 0.84 mg/dL (ref 0.61–1.24)
GFR calc Af Amer: >60 mL/min (ref >60)
GFR calc non Af Amer: >60 mL/min (ref >60)
Glucose, Bld: 164 mg/dL — ABNORMAL HIGH (ref 70–99)
Potassium: 3.3 mmol/L — ABNORMAL LOW (ref 3.5–5.1)
Sodium: 144 mmol/L (ref 135–145)
Total Bilirubin: 1.2 mg/dL (ref 0.3–1.2)
Total Protein: 5.8 g/dL — ABNORMAL LOW (ref 6.5–8.1)

## 2019-08-22 LAB — CBC WITH DIFFERENTIAL/PLATELET
Abs Immature Granulocytes: 0.05 10*3/uL (ref 0.00–0.07)
Basophils Absolute: 0 10*3/uL (ref 0.0–0.1)
Basophils Relative: 0 %
Eosinophils Absolute: 0 10*3/uL (ref 0.0–0.5)
Eosinophils Relative: 0 %
HCT: 36.7 % — ABNORMAL LOW (ref 39.0–52.0)
Hemoglobin: 12.4 g/dL — ABNORMAL LOW (ref 13.0–17.0)
Immature Granulocytes: 1 %
Lymphocytes Relative: 26 %
Lymphs Abs: 1.6 10*3/uL (ref 0.7–4.0)
MCH: 30.6 pg (ref 26.0–34.0)
MCHC: 33.8 g/dL (ref 30.0–36.0)
MCV: 90.6 fL (ref 80.0–100.0)
Monocytes Absolute: 0.2 10*3/uL (ref 0.1–1.0)
Monocytes Relative: 3 %
Neutro Abs: 4.5 10*3/uL (ref 1.7–7.7)
Neutrophils Relative %: 70 %
Platelets: 249 10*3/uL (ref 150–400)
RBC: 4.05 MIL/uL — ABNORMAL LOW (ref 4.22–5.81)
RDW: 14 % (ref 11.5–15.5)
WBC: 6.4 10*3/uL (ref 4.0–10.5)
nRBC: 0 % (ref 0.0–0.2)

## 2019-08-22 LAB — C-REACTIVE PROTEIN: CRP: 5.7 mg/dL — ABNORMAL HIGH (ref <1.0)

## 2019-08-22 LAB — GLUCOSE, CAPILLARY
Glucose-Capillary: 263 mg/dL — ABNORMAL HIGH (ref 70–99)
Glucose-Capillary: 96 mg/dL (ref 70–99)
Glucose-Capillary: 285 mg/dL — ABNORMAL HIGH (ref 70–99)
Glucose-Capillary: 140 mg/dL — ABNORMAL HIGH (ref 70–99)

## 2019-08-22 LAB — CULTURE, BLOOD (ROUTINE X 2)
Culture: NO GROWTH
Culture: NO GROWTH
Special Requests: ADEQUATE
Special Requests: ADEQUATE

## 2019-08-22 LAB — PREPARE FRESH FROZEN PLASMA: Unit division: 0

## 2019-08-22 LAB — BPAM FFP
Blood Product Expiration Date: 202101012315
ISSUE DATE / TIME: 202012312359
Unit Type and Rh: 5100

## 2019-08-22 LAB — HEMOGLOBIN A1C
Hgb A1c MFr Bld: 8.7 % — ABNORMAL HIGH (ref 4.8–5.6)
Mean Plasma Glucose: 202.99 mg/dL

## 2019-08-22 LAB — D-DIMER, QUANTITATIVE: D-Dimer, Quant: 1.61 ug/mL-FEU — ABNORMAL HIGH (ref 0.00–0.50)

## 2019-08-22 LAB — PROCALCITONIN: Procalcitonin: 0.26 ng/mL

## 2019-08-22 MED ORDER — INSULIN DETEMIR 100 UNIT/ML ~~LOC~~ SOLN
8.0000 [IU] | Freq: Once | SUBCUTANEOUS | Status: AC
  Administered 2019-08-22: 8 [IU] via SUBCUTANEOUS
  Filled 2019-08-22: qty 0.08

## 2019-08-22 MED ORDER — FLEET ENEMA 7-19 GM/118ML RE ENEM
1.0000 | ENEMA | Freq: Every day | RECTAL | Status: DC | PRN
  Administered 2019-08-27: 1 via RECTAL
  Filled 2019-08-22: qty 1

## 2019-08-22 MED ORDER — TOCILIZUMAB 400 MG/20ML IV SOLN
700.0000 mg | Freq: Once | INTRAVENOUS | Status: AC
  Administered 2019-08-22: 700 mg via INTRAVENOUS
  Filled 2019-08-22: qty 35

## 2019-08-22 MED ORDER — BISACODYL 5 MG PO TBEC: 10.0000 mg | DELAYED_RELEASE_TABLET | Freq: Every day | ORAL | Status: DC | PRN

## 2019-08-22 MED ORDER — INSULIN DETEMIR 100 UNIT/ML ~~LOC~~ SOLN
28.0000 [IU] | Freq: Every day | SUBCUTANEOUS | Status: DC
  Administered 2019-08-23 – 2019-09-02 (×11): 28 [IU] via SUBCUTANEOUS
  Filled 2019-08-22 (×11): qty 0.28

## 2019-08-22 MED ORDER — DEXAMETHASONE SODIUM PHOSPHATE 10 MG/ML IJ SOLN
10.0000 mg | Freq: Once | INTRAMUSCULAR | Status: AC
  Administered 2019-08-22: 15:00:00 10 mg via INTRAVENOUS
  Filled 2019-08-22: qty 1

## 2019-08-22 MED ORDER — ALPRAZOLAM 0.25 MG PO TABS
0.2500 mg | ORAL_TABLET | Freq: Three times a day (TID) | ORAL | Status: DC | PRN
  Administered 2019-08-22 – 2019-08-23 (×3): 0.25 mg via ORAL
  Filled 2019-08-22 (×3): qty 1

## 2019-08-22 MED ORDER — POTASSIUM CHLORIDE CRYS ER 20 MEQ PO TBCR
40.0000 meq | EXTENDED_RELEASE_TABLET | Freq: Once | ORAL | Status: AC
  Administered 2019-08-22: 10:00:00 40 meq via ORAL
  Filled 2019-08-22: qty 2

## 2019-08-22 MED ORDER — ENOXAPARIN SODIUM 40 MG/0.4ML ~~LOC~~ SOLN
40.0000 mg | Freq: Two times a day (BID) | SUBCUTANEOUS | Status: DC
  Administered 2019-08-22 – 2019-09-11 (×41): 40 mg via SUBCUTANEOUS
  Filled 2019-08-22 (×42): qty 0.4

## 2019-08-22 NOTE — Progress Notes (Addendum)
PROGRESS NOTE                                                                                                                                                                                                             Patient Demographics:    [name] [name], is a 64 y.o. male, DOB - July 13, 1956, OXB:[id number]  Admit date - 08/19/2019   Admitting Physician Lilyan Gilford, DO  Outpatient Primary MD for the patient is Mechele Claude, MD  LOS - 2   Chief Complaint  Patient presents with  . COVID positive       Brief Narrative    64 y.o. male, with history of hypertension, hyperlipidemia, diabetes mellitus, CAD, and sleep apnea who presents to the hospital with a c/c of shortness of breath.   Patient tested positive for Covid 19, noted to be hypoxic requiring 15 L high flow nasal cannula and NRB, transferred to [facility] for further management.  Patient with increased work of breathing, and with increased oxygen requirement, he was transferred to ICU 12/31 for need of heated high flow nasal cannula.   Subjective:    [name] [name] today denies any chest pain, reports dyspnea with exertion, reports some cough, nonproductive, reports generalized weakness .   Assessment  & Plan :    Active Problems:   CAD, CABG '96. SVG-RI PCI '06, 1/11. Cath 4/13 with ISR- medical Rx only   HTN (hypertension)   Dyslipidemia   Diabetes mellitus, Type 2 NIDDM   COVID-19  Acute hypoxic respiratory failure due to COVID-19 pneumonia -Chest x-ray significant for bilateral opacities, with worsening oxygen requirement overnight, he is on heated high flow 50 L oxygen 100%, and NRB  -Received Actemra 12/31, cussed with the patient, no history of TB, hepatitis viral infection, bowel perforation, or diverticulitis in the past, given worsening clinical status, will go ahead and proceed with another dose  dosing today. -Continue with IV steroids -Continue with IV  remdesivir. -Convulsant plasma 12/31 -Continue to trend inflammatory markers -Discussed at length with the patient, it was encouraged with incentive spirometry, flutter valve, as well discussed with staff, they will be able to prone patient today. -Procalcitonin mildly elevated at 0.26, continue with Rocephin and azithromycin, trending down -With extremely high oxygen requirement, with multiple comorbidities including diabetes mellitus, CAD, and hypertension, he is at high  risk for intubation.  COVID-19 Labs  Recent Labs    07/19/2019 1411 07/19/2019 1412 08/22/19 0315  DDIMER 2.57*  --  1.61*  FERRITIN  --  2,287*  --   LDH 704*  --   --   CRP  --  12.1* 5.7*    No results found for: SARSCOV2NAA  Transaminitis -Secondary to COVID-19, continue to monitor closely especially he is on remdesivir  Diabetes mellitus type 2, poorly controlled with hyperglycemia -A1c is 8.7 -BG elevated, will increase Levemir to 28 units subcu daily, continue with sliding scale and 4 units before meals .  Hypokalemia -Repleted, recheck in a.m.Marland Kitchen.  Hypertension -Resume home medications  CAD -Continue with Plavix, statin and beta-blocker  Hyperlipidemia -Continue with home dose statin     Code Status : Full  Family Communication  : Discussed with wife via phone  Disposition Plan  : Remains in  ICU  Barriers For Discharge : High oxygen requirement  Consults  : PCCM  Procedures  : None  DVT Prophylaxis  : Subcu Lovenox  Lab Results  Component Value Date   PLT 249 08/22/2019    Antibiotics  :    Anti-infectives (From admission, onward)   Start     Dose/Rate Route Frequency Ordered Stop   08/21/19 1000  remdesivir 100 mg in sodium chloride 0.9 % 100 mL IVPB     100 mg 200 mL/hr over 30 Minutes Intravenous Daily 07/19/2019 1742 08/25/19 0959   08/21/19 0815  cefTRIAXone (ROCEPHIN) 2 g in sodium chloride 0.9 % 100 mL IVPB     2 g 200 mL/hr over 30 Minutes Intravenous Every 24 hours  08/21/19 0810     08/21/19 0815  azithromycin (ZITHROMAX) 500 mg in sodium chloride 0.9 % 250 mL IVPB     500 mg 250 mL/hr over 60 Minutes Intravenous Every 24 hours 08/21/19 0810     07/19/2019 1745  remdesivir 200 mg in sodium chloride 0.9% 250 mL IVPB     200 mg 580 mL/hr over 30 Minutes Intravenous Once 07/19/2019 1742 07/19/2019 2045        Objective:   Vitals:   08/22/19 0700 08/22/19 0800 08/22/19 0807 08/22/19 0900  BP: (!) 130/53 (!) 130/56 (!) 128/55   Pulse: 69 69 71   Resp: (!) 39 (!) 28 (!) 28 (!) 31  Temp:      TempSrc:      SpO2: (!) 85% (!) 86% (!) 85% (!) 89%  Weight:      Height:        Wt Readings from Last 3 Encounters:  07/19/2019 88 kg  08/17/19 91.2 kg  08/11/19 92.1 kg     Intake/Output Summary (Last 24 hours) at 08/22/2019 1201 Last data filed at 08/22/2019 0600 Gross per 24 hour  Intake 1947.79 ml  Output 2050 ml  Net -102.21 ml     Physical Exam  Awake Alert, Oriented X 3, No new F.N deficits, Normal affect Symmetrical Chest wall movement, Good air movement bilaterally, patient with scattered rales RRR,No Gallops,Rubs or new Murmurs, No Parasternal Heave +ve B.Sounds, Abd Soft, No tenderness, No rebound - guarding or rigidity. No Cyanosis, Clubbing or edema, No new Rash or bruise      Data Review:    CBC Recent Labs  Lab 08/17/19 1637 07/19/2019 1411 08/21/19 0210 08/22/19 0315  WBC 4.7 6.4 5.0 6.4  HGB 15.5 14.2 14.0 12.4*  HCT 46.7 43.6 42.0 36.7*  PLT 148* 208 191 249  MCV 94.5 94.6 93.5 90.6  MCH 31.4 30.8 31.2 30.6  MCHC 33.2 32.6 33.3 33.8  RDW 13.2 13.6 13.9 14.0  LYMPHSABS 0.9 1.0 0.5* 1.6  MONOABS 0.4 0.4 0.2 0.2  EOSABS 0.0 0.1 0.0 0.0  BASOSABS 0.0 0.0 0.0 0.0    Chemistries  Recent Labs  Lab 08/17/19 1637 07/22/2019 1411 08/21/19 0210 08/22/19 0315  NA 132* 136 141 144  K 3.9 3.7 4.4 3.3*  CL 94* 97* 102 110  CO2 21* 18* 19* 20*  GLUCOSE 238* 236* 196* 164*  BUN 24* 30* 29* 34*  CREATININE 1.04 1.05 0.93 0.84   CALCIUM 8.1* 8.1* 7.7* 7.9*  AST 58* 139* 104* 79*  ALT 33 68* 51* 47*  ALKPHOS 64 73 64 73  BILITOT 1.3* 1.4* 1.3* 1.2   ------------------------------------------------------------------------------------------------------------------ Recent Labs    08/02/2019 1412  TRIG 186*    Lab Results  Component Value Date   HGBA1C 8.7 (H) 08/22/2019   ------------------------------------------------------------------------------------------------------------------ No results for input(s): TSH, T4TOTAL, T3FREE, THYROIDAB in the last 72 hours.  Invalid input(s): FREET3 ------------------------------------------------------------------------------------------------------------------ Recent Labs    07/23/2019 1412  FERRITIN 2,287*    Coagulation profile No results for input(s): INR, PROTIME in the last 168 hours.  Recent Labs    07/23/2019 1411 08/22/19 0315  DDIMER 2.57* 1.61*    Cardiac Enzymes No results for input(s): CKMB, TROPONINI, MYOGLOBIN in the last 168 hours.  Invalid input(s): CK ------------------------------------------------------------------------------------------------------------------ No results found for: BNP  Inpatient Medications  Scheduled Meds: . albuterol  2 puff Inhalation Q6H  . aspirin EC  81 mg Oral BID  . buPROPion  100 mg Oral BID  . Chlorhexidine Gluconate Cloth  6 each Topical Daily  . clopidogrel  75 mg Oral Daily  . dexamethasone (DECADRON) injection  10 mg Intravenous Once  . enoxaparin (LOVENOX) injection  40 mg Subcutaneous Q12H  . escitalopram  20 mg Oral QHS  . famotidine  20 mg Oral Daily  . insulin aspart  0-15 Units Subcutaneous TID WC  . insulin aspart  0-5 Units Subcutaneous QHS  . insulin aspart  4 Units Subcutaneous TID WC  . insulin detemir  20 Units Subcutaneous Daily  . ipratropium  2 puff Inhalation Q6H  . metoprolol succinate  25 mg Oral QHS  . rosuvastatin  10 mg Oral Daily  . senna  1 tablet Oral BID   Continuous  Infusions: . sodium chloride 10 mL/hr at 08/21/19 1752  . azithromycin 500 mg (08/22/19 1042)  . cefTRIAXone (ROCEPHIN)  IV 2 g (08/22/19 1010)  . remdesivir 100 mg in NS 100 mL 100 mg (08/22/19 0930)  . tocilizumab (ACTEMRA) IV     PRN Meds:.acetaminophen, guaiFENesin-dextromethorphan, ondansetron **OR** ondansetron (ZOFRAN) IV, traMADol  Micro Results Recent Results (from the past 240 hour(s))  Blood Culture (routine x 2)     Status: None   Collection Time: 08/17/19  4:51 PM   Specimen: BLOOD RIGHT ARM  Result Value Ref Range Status   Specimen Description BLOOD RIGHT ARM  Final   Special Requests   Final    BOTTLES DRAWN AEROBIC AND ANAEROBIC Blood Culture adequate volume   Culture   Final    NO GROWTH 5 DAYS Performed at [facility], [address] [place], [place] [address]    Report Status 08/22/2019 FINAL  Final  Blood Culture (routine x 2)     Status: None   Collection Time: 08/17/19  5:00 PM   Specimen: Left Antecubital; Blood  Result  Value Ref Range Status   Specimen Description LEFT ANTECUBITAL  Final   Special Requests   Final    BOTTLES DRAWN AEROBIC AND ANAEROBIC Blood Culture adequate volume   Culture   Final    NO GROWTH 5 DAYS Performed at [facility], [address], [place], [place] [address]    Report Status 08/22/2019 FINAL  Final  Blood Culture (routine x 2)     Status: None (Preliminary result)   Collection Time: 08/10/2019  2:12 PM   Specimen: BLOOD LEFT ARM  Result Value Ref Range Status   Specimen Description BLOOD LEFT ARM BOTTLES DRAWN AEROBIC ONLY  Final   Special Requests   Final    Blood Culture results may not be optimal due to an inadequate volume of blood received in culture bottles   Culture   Final    NO GROWTH 2 DAYS Performed at [facility], [name], [place], [place] [address]    Report Status PENDING  Incomplete  Blood Culture (routine x 2)     Status: None (Preliminary result)   Collection Time: 07/27/2019  2:13 PM    Specimen: BLOOD RIGHT ARM  Result Value Ref Range Status   Specimen Description BLOOD RIGHT ARM  Final   Special Requests   Final    Blood Culture adequate volume BOTTLES DRAWN AEROBIC AND ANAEROBIC   Culture   Final    NO GROWTH 2 DAYS Performed at [facility], [address] [place], [place] [address]    Report Status PENDING  Incomplete  MRSA PCR Screening     Status: None   Collection Time: 08/21/19  1:27 AM   Specimen: Nasal Mucosa; Nasopharyngeal  Result Value Ref Range Status   MRSA by PCR NEGATIVE NEGATIVE Final    Comment:        The GeneXpert MRSA Assay (FDA approved for NASAL specimens only), is one component of a comprehensive MRSA colonization surveillance program. It is not intended to diagnose MRSA infection nor to guide or monitor treatment for MRSA infections. Performed at [facility], [address], [place], [place] [address]     Radiology Reports DG Chest [place] 1 View  Result Date: 08/10/2019 CLINICAL DATA:  Shortness of breath, hypoxia, COVID-19 positive EXAM: PORTABLE CHEST 1 VIEW COMPARISON:  08/17/2019 FINDINGS: Postsurgical changes to the sternum and mediastinum. Stable heart size. Increased patchy opacities within the bilateral lungs, right greater than left. Opacities are in a predominantly peripheral distribution. No large pleural fluid collection. No pneumothorax. IMPRESSION: Increased patchy bilateral airspace opacities suspicious for multifocal atypical/viral pneumonia. Electronically Signed   By: Duanne Guess D.O.   On: 08/19/2019 13:22   DG Chest Port 1 View  Result Date: 08/17/2019 CLINICAL DATA:  64 year old male with shortness of breath. Positive COVID-19. EXAM: PORTABLE CHEST 1 VIEW COMPARISON:  Chest radiograph dated 01/30/2015. FINDINGS: There is diffuse interstitial and vascular prominence most likely mild vascular congestion. Minimal hazy density in the mid to lower lung field, likely combination of vascular congestion and  atelectasis. Atypical infection is less likely but not excluded. Clinical correlation is recommended. No focal consolidation, pleural effusion, or pneumothorax. Top-normal cardiac size. Median sternotomy wires and CABG vascular clips. Atherosclerotic calcification of the aorta. No acute osseous pathology. IMPRESSION: 1. Mild vascular congestion. 2. Minimal hazy density in the mid to lower lung field, likely combination of vascular congestion and atelectasis. Atypical infection is less likely but not excluded. No focal consolidation. Electronically Signed   By: Burtis Junes  Radparvar M.D.   On: 08/17/2019 16:49     Phillips Climes M.D on 08/22/2019 at 12:01 PM  Between 7am to 7pm - Pager - [phone]  After 7pm go to [email or web address] - password [facility]  [facility] -  Office  [phone]

## 2019-08-22 NOTE — Progress Notes (Signed)
Pt's spouse called for update on pt.  Nurse stated pt has maintained overnight, not any worse, but no improvement yet.  Pt had stayed on HHFNC and NRB to keep sats > 88%.

## 2019-08-22 DEATH — deceased

## 2019-08-23 ENCOUNTER — Inpatient Hospital Stay: Payer: Self-pay

## 2019-08-23 ENCOUNTER — Inpatient Hospital Stay (HOSPITAL_COMMUNITY): Payer: BC Managed Care – PPO

## 2019-08-23 DIAGNOSIS — J9600 Acute respiratory failure, unspecified whether with hypoxia or hypercapnia: Secondary | ICD-10-CM

## 2019-08-23 DIAGNOSIS — E119 Type 2 diabetes mellitus without complications: Secondary | ICD-10-CM

## 2019-08-23 LAB — CBC WITH DIFFERENTIAL/PLATELET
Abs Immature Granulocytes: 0.04 10*3/uL (ref 0.00–0.07)
Basophils Absolute: 0 10*3/uL (ref 0.0–0.1)
Basophils Relative: 0 %
Eosinophils Absolute: 0 10*3/uL (ref 0.0–0.5)
Eosinophils Relative: 0 %
HCT: 38.7 % — ABNORMAL LOW (ref 39.0–52.0)
Hemoglobin: 13.3 g/dL (ref 13.0–17.0)
Immature Granulocytes: 1 %
Lymphocytes Relative: 16 %
Lymphs Abs: 1 10*3/uL (ref 0.7–4.0)
MCH: 30.7 pg (ref 26.0–34.0)
MCHC: 34.4 g/dL (ref 30.0–36.0)
MCV: 89.4 fL (ref 80.0–100.0)
Monocytes Absolute: 0.3 10*3/uL (ref 0.1–1.0)
Monocytes Relative: 4 %
Neutro Abs: 5 10*3/uL (ref 1.7–7.7)
Neutrophils Relative %: 79 %
Platelets: 287 10*3/uL (ref 150–400)
RBC: 4.33 MIL/uL (ref 4.22–5.81)
RDW: 13.8 % (ref 11.5–15.5)
WBC: 6.4 10*3/uL (ref 4.0–10.5)
nRBC: 0 % (ref 0.0–0.2)

## 2019-08-23 LAB — D-DIMER, QUANTITATIVE: D-Dimer, Quant: 1.77 ug/mL-FEU — ABNORMAL HIGH (ref 0.00–0.50)

## 2019-08-23 LAB — COMPREHENSIVE METABOLIC PANEL
ALT: 46 U/L — ABNORMAL HIGH (ref 0–44)
AST: 92 U/L — ABNORMAL HIGH (ref 15–41)
Albumin: 2.7 g/dL — ABNORMAL LOW (ref 3.5–5.0)
Alkaline Phosphatase: 95 U/L (ref 38–126)
Anion gap: 13 (ref 5–15)
BUN: 30 mg/dL — ABNORMAL HIGH (ref 8–23)
CO2: 22 mmol/L (ref 22–32)
Calcium: 7.7 mg/dL — ABNORMAL LOW (ref 8.9–10.3)
Chloride: 109 mmol/L (ref 98–111)
Creatinine, Ser: 0.7 mg/dL (ref 0.61–1.24)
GFR calc Af Amer: >60 mL/min (ref >60)
GFR calc non Af Amer: >60 mL/min (ref >60)
Glucose, Bld: 109 mg/dL — ABNORMAL HIGH (ref 70–99)
Potassium: 3 mmol/L — ABNORMAL LOW (ref 3.5–5.1)
Sodium: 144 mmol/L (ref 135–145)
Total Bilirubin: 1.1 mg/dL (ref 0.3–1.2)
Total Protein: 5.8 g/dL — ABNORMAL LOW (ref 6.5–8.1)

## 2019-08-23 LAB — POCT I-STAT 7, (LYTES, BLD GAS, ICA,H+H)
Acid-base deficit: 2 mmol/L (ref 0.0–2.0)
Bicarbonate: 26.7 mmol/L (ref 20.0–28.0)
Calcium, Ion: 1.2 mmol/L (ref 1.15–1.40)
HCT: 41 % (ref 39.0–52.0)
Hemoglobin: 13.9 g/dL (ref 13.0–17.0)
O2 Saturation: 94 %
Patient temperature: 98.4
Potassium: 3.1 mmol/L — ABNORMAL LOW (ref 3.5–5.1)
Sodium: 147 mmol/L — ABNORMAL HIGH (ref 135–145)
TCO2: 28 mmol/L (ref 22–32)
pCO2 arterial: 59.2 mmHg — ABNORMAL HIGH (ref 32.0–48.0)
pH, Arterial: 7.262 — ABNORMAL LOW (ref 7.350–7.450)
pO2, Arterial: 84 mmHg (ref 83.0–108.0)

## 2019-08-23 LAB — C-REACTIVE PROTEIN: CRP: 2.8 mg/dL — ABNORMAL HIGH (ref <1.0)

## 2019-08-23 LAB — PROCALCITONIN: Procalcitonin: 0.11 ng/mL

## 2019-08-23 LAB — GLUCOSE, CAPILLARY
Glucose-Capillary: 136 mg/dL — ABNORMAL HIGH (ref 70–99)
Glucose-Capillary: 160 mg/dL — ABNORMAL HIGH (ref 70–99)
Glucose-Capillary: 168 mg/dL — ABNORMAL HIGH (ref 70–99)
Glucose-Capillary: 123 mg/dL — ABNORMAL HIGH (ref 70–99)

## 2019-08-23 MED ORDER — FENTANYL CITRATE (PF) 100 MCG/2ML IJ SOLN: 50.0000 ug | Freq: Once | INTRAMUSCULAR | Status: DC

## 2019-08-23 MED ORDER — ETOMIDATE 2 MG/ML IV SOLN: 40.0000 mg | Freq: Once | INTRAVENOUS | Status: AC

## 2019-08-23 MED ORDER — IPRATROPIUM-ALBUTEROL 0.5-2.5 (3) MG/3ML IN SOLN: 3.0000 mL | Freq: Four times a day (QID) | RESPIRATORY_TRACT | Status: DC | PRN

## 2019-08-23 MED ORDER — MIDAZOLAM 50MG/50ML (1MG/ML) PREMIX INFUSION
2.0000 mg/h | INTRAVENOUS | Status: DC
Start: 2019-08-23 — End: 2019-08-23

## 2019-08-23 MED ORDER — FENTANYL 2500MCG IN NS 250ML (10MCG/ML) PREMIX INFUSION
50.0000 ug/h | INTRAVENOUS | Status: DC
  Administered 2019-08-23: 100 ug/h via INTRAVENOUS
  Administered 2019-08-24: 07:00:00 125 ug/h via INTRAVENOUS
  Administered 2019-08-24 – 2019-08-25 (×2): 200 ug/h via INTRAVENOUS
  Administered 2019-08-25 – 2019-08-26 (×4): 300 ug/h via INTRAVENOUS
  Administered 2019-08-27: 17:00:00 200 ug/h via INTRAVENOUS
  Administered 2019-08-27: 300 ug/h via INTRAVENOUS
  Administered 2019-08-28: 200 ug/h via INTRAVENOUS
  Filled 2019-08-23 (×10): qty 250

## 2019-08-23 MED ORDER — ETOMIDATE 2 MG/ML IV SOLN
INTRAVENOUS | Status: AC
  Administered 2019-08-23: 40 mg via INTRAVENOUS
  Filled 2019-08-23: qty 10

## 2019-08-23 MED ORDER — MIDAZOLAM 50MG/50ML (1MG/ML) PREMIX INFUSION
INTRAVENOUS | Status: AC
  Administered 2019-08-23: 13:00:00 2 mg/h via INTRAVENOUS
  Filled 2019-08-23: qty 50

## 2019-08-23 MED ORDER — MIDAZOLAM HCL 2 MG/2ML IJ SOLN
INTRAMUSCULAR | Status: AC
  Administered 2019-08-23: 4 mg via INTRAVENOUS
  Filled 2019-08-23: qty 4

## 2019-08-23 MED ORDER — DEXAMETHASONE SODIUM PHOSPHATE 10 MG/ML IJ SOLN
10.0000 mg | Freq: Every day | INTRAMUSCULAR | Status: DC
  Administered 2019-08-23 – 2019-09-01 (×10): 10 mg via INTRAVENOUS
  Filled 2019-08-23 (×10): qty 1

## 2019-08-23 MED ORDER — CISATRACURIUM BOLUS VIA INFUSION
5.0000 mg | Freq: Once | INTRAVENOUS | Status: AC
  Administered 2019-08-23: 22:00:00 5 mg via INTRAVENOUS
  Filled 2019-08-23: qty 5

## 2019-08-23 MED ORDER — MIDAZOLAM 50MG/50ML (1MG/ML) PREMIX INFUSION
1.0000 mg/h | INTRAVENOUS | Status: DC
  Administered 2019-08-23 – 2019-08-24 (×3): 4 mg/h via INTRAVENOUS
  Administered 2019-08-24: 9 mg/h via INTRAVENOUS
  Administered 2019-08-25: 8 mg/h via INTRAVENOUS
  Administered 2019-08-25: 11:00:00 4 mg/h via INTRAVENOUS
  Administered 2019-08-26 (×4): 8 mg/h via INTRAVENOUS
  Administered 2019-08-27: 20:00:00 4 mg/h via INTRAVENOUS
  Administered 2019-08-27: 8 mg/h via INTRAVENOUS
  Administered 2019-08-28: 4 mg/h via INTRAVENOUS
  Filled 2019-08-23 (×14): qty 50

## 2019-08-23 MED ORDER — ROCURONIUM BROMIDE 50 MG/5ML IV SOLN
80.0000 mg | Freq: Once | INTRAVENOUS | Status: DC
  Administered 2019-08-23: 80 mg via INTRAVENOUS

## 2019-08-23 MED ORDER — FENTANYL CITRATE (PF) 100 MCG/2ML IJ SOLN
INTRAMUSCULAR | Status: AC
  Administered 2019-08-23: 100 ug via INTRAVENOUS
  Filled 2019-08-23: qty 2

## 2019-08-23 MED ORDER — POTASSIUM CHLORIDE CRYS ER 20 MEQ PO TBCR
40.0000 meq | EXTENDED_RELEASE_TABLET | Freq: Four times a day (QID) | ORAL | Status: AC
  Administered 2019-08-23 (×2): 40 meq via ORAL
  Filled 2019-08-23 (×2): qty 2

## 2019-08-23 MED ORDER — ETOMIDATE 2 MG/ML IV SOLN
INTRAVENOUS | Status: AC
  Filled 2019-08-23: qty 10

## 2019-08-23 MED ORDER — FENTANYL 2500MCG IN NS 250ML (10MCG/ML) PREMIX INFUSION
INTRAVENOUS | Status: AC
  Administered 2019-08-23: 50 ug/h via INTRAVENOUS
  Filled 2019-08-23: qty 250

## 2019-08-23 MED ORDER — MIDAZOLAM HCL 2 MG/2ML IJ SOLN: 4.0000 mg | Freq: Once | INTRAMUSCULAR | Status: AC

## 2019-08-23 MED ORDER — ROCURONIUM BROMIDE 10 MG/ML (PF) SYRINGE
PREFILLED_SYRINGE | INTRAVENOUS | Status: AC
  Filled 2019-08-23: qty 10

## 2019-08-23 MED ORDER — SODIUM CHLORIDE 0.9 % IV SOLN
0.0000 ug/kg/min | INTRAVENOUS | Status: DC
  Administered 2019-08-23 – 2019-08-26 (×5): 3 ug/kg/min via INTRAVENOUS
  Filled 2019-08-23 (×5): qty 20

## 2019-08-23 MED ORDER — MIDAZOLAM BOLUS VIA INFUSION
1.0000 mg | INTRAVENOUS | Status: DC | PRN
  Administered 2019-08-27: 20:00:00 2 mg via INTRAVENOUS
  Filled 2019-08-23: qty 2

## 2019-08-23 MED ORDER — INSULIN ASPART 100 UNIT/ML ~~LOC~~ SOLN
0.0000 [IU] | SUBCUTANEOUS | Status: DC
  Administered 2019-08-23: 22:00:00 2 [IU] via SUBCUTANEOUS
  Administered 2019-08-23: 3 [IU] via SUBCUTANEOUS
  Administered 2019-08-24: 2 [IU] via SUBCUTANEOUS
  Administered 2019-08-24 – 2019-08-25 (×3): 3 [IU] via SUBCUTANEOUS
  Administered 2019-08-25: 5 [IU] via SUBCUTANEOUS
  Administered 2019-08-25 (×2): 3 [IU] via SUBCUTANEOUS
  Administered 2019-08-26: 21:00:00 8 [IU] via SUBCUTANEOUS
  Administered 2019-08-26: 23:00:00 11 [IU] via SUBCUTANEOUS
  Administered 2019-08-26: 12:00:00 3 [IU] via SUBCUTANEOUS
  Administered 2019-08-26: 17:00:00 8 [IU] via SUBCUTANEOUS
  Administered 2019-08-26: 08:00:00 3 [IU] via SUBCUTANEOUS
  Administered 2019-08-26: 03:00:00 5 [IU] via SUBCUTANEOUS
  Administered 2019-08-27: 04:00:00 8 [IU] via SUBCUTANEOUS
  Administered 2019-08-27: 5 [IU] via SUBCUTANEOUS
  Administered 2019-08-27: 17:00:00 8 [IU] via SUBCUTANEOUS
  Administered 2019-08-27: 21:00:00 11 [IU] via SUBCUTANEOUS
  Administered 2019-08-27: 08:00:00 5 [IU] via SUBCUTANEOUS
  Administered 2019-08-27 – 2019-08-28 (×2): 8 [IU] via SUBCUTANEOUS
  Administered 2019-08-28: 08:00:00 3 [IU] via SUBCUTANEOUS
  Administered 2019-08-28: 16:00:00 8 [IU] via SUBCUTANEOUS
  Administered 2019-08-28: 13:00:00 5 [IU] via SUBCUTANEOUS
  Administered 2019-08-28: 03:00:00 8 [IU] via SUBCUTANEOUS
  Administered 2019-08-29: 01:00:00 11 [IU] via SUBCUTANEOUS
  Administered 2019-08-29: 3 [IU] via SUBCUTANEOUS
  Administered 2019-08-29 (×2): 8 [IU] via SUBCUTANEOUS
  Administered 2019-08-29: 20:00:00 3 [IU] via SUBCUTANEOUS
  Administered 2019-08-29: 8 [IU] via SUBCUTANEOUS
  Administered 2019-08-30: 05:00:00 3 [IU] via SUBCUTANEOUS
  Administered 2019-08-30: 17:00:00 5 [IU] via SUBCUTANEOUS
  Administered 2019-08-30 (×2): 3 [IU] via SUBCUTANEOUS
  Administered 2019-08-30: 12:00:00 5 [IU] via SUBCUTANEOUS
  Administered 2019-08-30 – 2019-08-31 (×2): 3 [IU] via SUBCUTANEOUS
  Administered 2019-08-31: 8 [IU] via SUBCUTANEOUS
  Administered 2019-08-31: 12:00:00 2 [IU] via SUBCUTANEOUS
  Administered 2019-08-31: 3 [IU] via SUBCUTANEOUS
  Administered 2019-08-31: 21:00:00 5 [IU] via SUBCUTANEOUS
  Administered 2019-08-31 – 2019-09-01 (×3): 3 [IU] via SUBCUTANEOUS
  Administered 2019-09-01 (×2): 5 [IU] via SUBCUTANEOUS
  Administered 2019-09-01 – 2019-09-02 (×5): 3 [IU] via SUBCUTANEOUS
  Administered 2019-09-02: 01:00:00 5 [IU] via SUBCUTANEOUS
  Administered 2019-09-03 (×2): 3 [IU] via SUBCUTANEOUS
  Administered 2019-09-03: 2 [IU] via SUBCUTANEOUS
  Administered 2019-09-03 (×2): 3 [IU] via SUBCUTANEOUS
  Administered 2019-09-03 – 2019-09-04 (×2): 5 [IU] via SUBCUTANEOUS
  Administered 2019-09-04: 2 [IU] via SUBCUTANEOUS
  Administered 2019-09-04: 20:00:00 5 [IU] via SUBCUTANEOUS
  Administered 2019-09-04: 12:00:00 3 [IU] via SUBCUTANEOUS
  Administered 2019-09-05 (×2): 2 [IU] via SUBCUTANEOUS
  Administered 2019-09-05 – 2019-09-06 (×4): 5 [IU] via SUBCUTANEOUS
  Administered 2019-09-06: 04:00:00 3 [IU] via SUBCUTANEOUS
  Administered 2019-09-06 (×2): 5 [IU] via SUBCUTANEOUS
  Administered 2019-09-07 (×3): 2 [IU] via SUBCUTANEOUS
  Administered 2019-09-08 (×3): 3 [IU] via SUBCUTANEOUS
  Administered 2019-09-08: 09:00:00 2 [IU] via SUBCUTANEOUS
  Administered 2019-09-08: 21:00:00 3 [IU] via SUBCUTANEOUS
  Administered 2019-09-08: 13:00:00 5 [IU] via SUBCUTANEOUS
  Administered 2019-09-09: 16:00:00 2 [IU] via SUBCUTANEOUS
  Administered 2019-09-09 (×3): 3 [IU] via SUBCUTANEOUS
  Administered 2019-09-10: 08:00:00 2 [IU] via SUBCUTANEOUS
  Administered 2019-09-10 (×2): 3 [IU] via SUBCUTANEOUS
  Administered 2019-09-10: 5 [IU] via SUBCUTANEOUS
  Administered 2019-09-10: 2 [IU] via SUBCUTANEOUS
  Administered 2019-09-10: 21:00:00 5 [IU] via SUBCUTANEOUS
  Administered 2019-09-11 – 2019-09-12 (×4): 2 [IU] via SUBCUTANEOUS
  Administered 2019-09-12: 20:00:00 3 [IU] via SUBCUTANEOUS
  Administered 2019-09-12: 12:00:00 2 [IU] via SUBCUTANEOUS
  Administered 2019-09-12 – 2019-09-13 (×4): 3 [IU] via SUBCUTANEOUS

## 2019-08-23 MED ORDER — FENTANYL 2500MCG IN NS 250ML (10MCG/ML) PREMIX INFUSION: 0.0000 ug/h | INTRAVENOUS | Status: DC

## 2019-08-23 MED ORDER — ARTIFICIAL TEARS OPHTHALMIC OINT
1.0000 "application " | TOPICAL_OINTMENT | Freq: Three times a day (TID) | OPHTHALMIC | Status: DC
  Administered 2019-08-23 – 2019-09-01 (×26): 1 via OPHTHALMIC
  Filled 2019-08-23 (×6): qty 3.5

## 2019-08-23 MED ORDER — FENTANYL BOLUS VIA INFUSION
50.0000 ug | INTRAVENOUS | Status: DC | PRN
  Filled 2019-08-23: qty 50

## 2019-08-23 MED ORDER — ESCITALOPRAM OXALATE 10 MG PO TABS
20.0000 mg | ORAL_TABLET | Freq: Every day | ORAL | Status: DC
  Administered 2019-08-23 – 2019-09-06 (×15): 20 mg
  Filled 2019-08-23 (×16): qty 2

## 2019-08-23 MED ORDER — FENTANYL CITRATE (PF) 100 MCG/2ML IJ SOLN: 100.0000 ug | Freq: Once | INTRAMUSCULAR | Status: AC

## 2019-08-23 NOTE — Progress Notes (Signed)
PROGRESS NOTE                                                                                                                                                                                                             Patient Demographics:    [name] [name], is a 64 y.o. male, DOB - February 01, 1956, WNI:[id number]  Admit date - 08/11/2019   Admitting Physician Rolla Plate, DO  Outpatient Primary MD for the patient is Claretta Fraise, MD  LOS - 3   Chief Complaint  Patient presents with  . COVID positive       Brief Narrative    64 y.o. male, with history of hypertension, hyperlipidemia, diabetes mellitus, CAD, and sleep apnea who presents to the hospital with a c/c of shortness of breath.   Patient tested positive for Covid 19, noted to be hypoxic requiring 15 L high flow nasal cannula and NRB, transferred to [facility] for further management.  Patient with increased work of breathing, and with increased oxygen requirement, he was transferred to ICU 12/31 for need of heated high flow nasal cannula.  Patient continues to have significant sedation requirement, this morning he is on 60 L heated high flow nasal cannula, 100%, and NRB as well, patient with increased work of breathing, altered mental status, patient was intubated by [facility] 08/23/2019  12/31 transferred from [facility] to [facility], patient admitted 12/20 12/31 received plasma and Actemra 12/31 transferred to ICU 1/1 received second dose of Actemra 1/2 intubated   Subjective:    [name] [name] today is confused, unable to provide appropriate history   Assessment  & Plan :    Active Problems:   CAD, CABG '96. SVG-RI PCI '06, 1/11. Cath 4/13 with ISR- medical Rx only   HTN (hypertension)   Dyslipidemia   Diabetes mellitus, Type 2 NIDDM   COVID-19   Acute respiratory failure with hypoxia (HCC)  Acute hypoxic respiratory failure due to COVID-19 pneumonia -Chest x-ray significant for bilateral  opacities, with worsening oxygen requirement overnight, he is on heated high flow 60 L oxygen 100%, and NRB, patient was intubated by PCCM this morning -Received Actemra 12/31, and second dose received 1/1 .. -Continue with IV steroids -Continue with IV remdesivir. -Convulsant plasma 12/31 -Continue to trend inflammatory markers -Procalcitonin mildly elevated at 0.26, continue with Rocephin and  azithromycin, trending down -Vent management per PCCM, sedation per PCCM COVID-19 Labs  Recent Labs    08/22/19 0315 08/23/19 0530  DDIMER 1.61* 1.77*  CRP 5.7* 2.8*    No results found for: SARSCOV2NAA  Transaminitis -Secondary to COVID-19, continue to monitor closely especially he is on remdesivir  Diabetes mellitus type 2, poorly controlled with hyperglycemia -A1c is 8.7 -Patient is intubated, will change sliding scale to every 4 hours, continue with Levemir 28 units daily  Hypokalemia -Repleted,   Hypertension -Hold meds  CAD -Continue with Plavix, statin and beta-blocker  Hyperlipidemia -Continue with home dose statin     Code Status : Full  Family Communication  : Discussed with wife via phone 08/22/2019  Disposition Plan  : Remains in  ICU  Consults  : PCCM  Procedures  : None  DVT Prophylaxis  : Subcu Lovenox  Lab Results  Component Value Date   PLT 287 08/23/2019    Antibiotics  :    Anti-infectives (From admission, onward)   Start     Dose/Rate Route Frequency Ordered Stop   08/21/19 1000  remdesivir 100 mg in sodium chloride 0.9 % 100 mL IVPB     100 mg 200 mL/hr over 30 Minutes Intravenous Daily 08/15/2019 1742 08/25/19 0959   08/21/19 0815  cefTRIAXone (ROCEPHIN) 2 g in sodium chloride 0.9 % 100 mL IVPB     2 g 200 mL/hr over 30 Minutes Intravenous Every 24 hours 08/21/19 0810 08/26/19 0959   08/21/19 0815  azithromycin (ZITHROMAX) 500 mg in sodium chloride 0.9 % 250 mL IVPB     500 mg 250 mL/hr over 60 Minutes Intravenous Every 24 hours 08/21/19  0810 08/26/19 0959   08/15/2019 1745  remdesivir 200 mg in sodium chloride 0.9% 250 mL IVPB     200 mg 580 mL/hr over 30 Minutes Intravenous Once 07/29/2019 1742 07/24/2019 2045        Objective:   Vitals:   08/23/19 1200 08/23/19 1300 08/23/19 1330 08/23/19 1400  BP: 139/73 140/70 140/66 (!) 144/67  Pulse: 91 86 81 78  Resp: (!) 45 20 20 20   Temp: 98.4 F (36.9 C)     TempSrc: Axillary     SpO2: (!) 86% (!) 89% 94% 94%  Weight:      Height:        Wt Readings from Last 3 Encounters:  07/22/2019 88 kg  08/17/19 91.2 kg  08/11/19 92.1 kg     Intake/Output Summary (Last 24 hours) at 08/23/2019 1455 Last data filed at 08/23/2019 1300 Gross per 24 hour  Intake 545.69 ml  Output 1550 ml  Net -1004.31 ml     Physical Exam  Patient was seen and examined before intubation  Awake, confused, tachypneic, appears to be uncomfortable Symmetrical Chest wall movement, good air entry bilaterally, tachypneic with some use of accessory muscle with bibasilar inspiratory crackles  RRR,No Gallops,Rubs or new Murmurs, No Parasternal Heave +ve B.Sounds, Abd Soft, No tenderness, No rebound - guarding or rigidity. No Cyanosis, Clubbing or edema, No new Rash or bruise       Data Review:    CBC Recent Labs  Lab 08/17/19 1637 08/02/2019 1411 08/21/19 0210 08/22/19 0315 08/23/19 0530 08/23/19 1416  WBC 4.7 6.4 5.0 6.4 6.4  --   HGB 15.5 14.2 14.0 12.4* 13.3 13.9  HCT 46.7 43.6 42.0 36.7* 38.7* 41.0  PLT 148* 208 191 249 287  --   MCV 94.5 94.6 93.5 90.6 89.4  --  MCH 31.4 30.8 31.2 30.6 30.7  --   MCHC 33.2 32.6 33.3 33.8 34.4  --   RDW 13.2 13.6 13.9 14.0 13.8  --   LYMPHSABS 0.9 1.0 0.5* 1.6 1.0  --   MONOABS 0.4 0.4 0.2 0.2 0.3  --   EOSABS 0.0 0.1 0.0 0.0 0.0  --   BASOSABS 0.0 0.0 0.0 0.0 0.0  --     Chemistries  Recent Labs  Lab 08/17/19 1637 09/02/2019 1411 08/21/19 0210 08/22/19 0315 08/23/19 0530 08/23/19 1416  NA 132* 136 141 144 144 147*  K 3.9 3.7 4.4 3.3* 3.0*  3.1*  CL 94* 97* 102 110 109  --   CO2 21* 18* 19* 20* 22  --   GLUCOSE 238* 236* 196* 164* 109*  --   BUN 24* 30* 29* 34* 30*  --   CREATININE 1.04 1.05 0.93 0.84 0.70  --   CALCIUM 8.1* 8.1* 7.7* 7.9* 7.7*  --   AST 58* 139* 104* 79* 92*  --   ALT 33 68* 51* 47* 46*  --   ALKPHOS 64 73 64 73 95  --   BILITOT 1.3* 1.4* 1.3* 1.2 1.1  --    ------------------------------------------------------------------------------------------------------------------ No results for input(s): CHOL, HDL, LDLCALC, TRIG, CHOLHDL, LDLDIRECT in the last 72 hours.  Lab Results  Component Value Date   HGBA1C 8.7 (H) 08/22/2019   ------------------------------------------------------------------------------------------------------------------ No results for input(s): TSH, T4TOTAL, T3FREE, THYROIDAB in the last 72 hours.  Invalid input(s): FREET3 ------------------------------------------------------------------------------------------------------------------ No results for input(s): VITAMINB12, FOLATE, FERRITIN, TIBC, IRON, RETICCTPCT in the last 72 hours.  Coagulation profile No results for input(s): INR, PROTIME in the last 168 hours.  Recent Labs    08/22/19 0315 08/23/19 0530  DDIMER 1.61* 1.77*    Cardiac Enzymes No results for input(s): CKMB, TROPONINI, MYOGLOBIN in the last 168 hours.  Invalid input(s): CK ------------------------------------------------------------------------------------------------------------------ No results found for: BNP  Inpatient Medications  Scheduled Meds: . aspirin EC  81 mg Oral BID  . buPROPion  100 mg Oral BID  . Chlorhexidine Gluconate Cloth  6 each Topical Daily  . clopidogrel  75 mg Oral Daily  . dexamethasone (DECADRON) injection  10 mg Intravenous Daily  . enoxaparin (LOVENOX) injection  40 mg Subcutaneous Q12H  . escitalopram  20 mg Per Tube QHS  . famotidine  20 mg Oral Daily  . insulin aspart  0-15 Units Subcutaneous TID WC  . insulin  aspart  0-5 Units Subcutaneous QHS  . insulin aspart  4 Units Subcutaneous TID WC  . insulin detemir  28 Units Subcutaneous Daily  . potassium chloride  40 mEq Oral Q6H  . rosuvastatin  10 mg Oral Daily  . senna  1 tablet Oral BID   Continuous Infusions: . sodium chloride 10 mL/hr at 08/21/19 1752  . azithromycin Stopped (08/23/19 1010)  . cefTRIAXone (ROCEPHIN)  IV Stopped (08/23/19 1050)  . fentaNYL infusion INTRAVENOUS 75 mcg/hr (08/23/19 1300)  . midazolam 3 mg/hr (08/23/19 1300)  . remdesivir 100 mg in NS 100 mL Stopped (08/23/19 1129)   PRN Meds:.acetaminophen, bisacodyl, ipratropium-albuterol, ondansetron **OR** ondansetron (ZOFRAN) IV, sodium phosphate  Micro Results Recent Results (from the past 240 hour(s))  Blood Culture (routine x 2)     Status: None   Collection Time: 08/17/19  4:51 PM   Specimen: BLOOD RIGHT ARM  Result Value Ref Range Status   Specimen Description BLOOD RIGHT ARM  Final   Special Requests   Final    BOTTLES  DRAWN AEROBIC AND ANAEROBIC Blood Culture adequate volume   Culture   Final    NO GROWTH 5 DAYS Performed at [facility], [address] [place], [place] [address]    Report Status 08/22/2019 FINAL  Final  Blood Culture (routine x 2)     Status: None   Collection Time: 08/17/19  5:00 PM   Specimen: Left Antecubital; Blood  Result Value Ref Range Status   Specimen Description LEFT ANTECUBITAL  Final   Special Requests   Final    BOTTLES DRAWN AEROBIC AND ANAEROBIC Blood Culture adequate volume   Culture   Final    NO GROWTH 5 DAYS Performed at [facility], [address], [place], [place] [address]    Report Status 08/22/2019 FINAL  Final  Blood Culture (routine x 2)     Status: None (Preliminary result)   Collection Time: 07/26/2019  2:12 PM   Specimen: BLOOD LEFT ARM  Result Value Ref Range Status   Specimen Description BLOOD LEFT ARM BOTTLES DRAWN AEROBIC ONLY  Final   Special Requests   Final    Blood Culture results may not be  optimal due to an inadequate volume of blood received in culture bottles   Culture   Final    NO GROWTH 3 DAYS Performed at [facility], [address], [place], [place] [address]    Report Status PENDING  Incomplete  Blood Culture (routine x 2)     Status: None (Preliminary result)   Collection Time: 08/21/2019  2:13 PM   Specimen: BLOOD RIGHT ARM  Result Value Ref Range Status   Specimen Description BLOOD RIGHT ARM  Final   Special Requests   Final    Blood Culture adequate volume BOTTLES DRAWN AEROBIC AND ANAEROBIC   Culture   Final    NO GROWTH 3 DAYS Performed at [facility], [address] [place], [place] [address]    Report Status PENDING  Incomplete  MRSA PCR Screening     Status: None   Collection Time: 08/21/19  1:27 AM   Specimen: Nasal Mucosa; Nasopharyngeal  Result Value Ref Range Status   MRSA by PCR NEGATIVE NEGATIVE Final    Comment:        The GeneXpert MRSA Assay (FDA approved for NASAL specimens only), is one component of a comprehensive MRSA colonization surveillance program. It is not intended to diagnose MRSA infection nor to guide or monitor treatment for MRSA infections. Performed at [facility], [address], [place], [place] [address]     Radiology Reports DG CHEST PORT 1 VIEW  Result Date: 08/23/2019 CLINICAL DATA:  Encounter for central line placement EXAM: PORTABLE CHEST 1 VIEW COMPARISON:  Chest radiograph 08/12/2019 FINDINGS: Interval intubation with endotracheal tube tip between the thoracic inlet and carina. Interval placement of a right central venous catheter with tip projecting over the cavoatrial junction. Interval placement of a nasogastric tube with side port projecting over the stomach. Stable cardiomediastinal contours status post median sternotomy and CABG. Interval increase in diffuse interstitial and airspace opacities throughout the bilateral lungs. No evidence of pneumothorax or large pleural effusion. IMPRESSION: 1.  Appropriate positioning of the endotracheal tube, nasogastric tube and right central venous catheter. 2. Increased diffuse bilateral interstitial and airspace opacities. Electronically Signed   By: Emmaline Kluver M.D.   On: 08/23/2019 14:16   DG Chest Port 1 View  Result Date: 07/29/2019 CLINICAL DATA:  Shortness of breath, hypoxia, COVID-19 positive EXAM: PORTABLE CHEST 1 VIEW  COMPARISON:  08/17/2019 FINDINGS: Postsurgical changes to the sternum and mediastinum. Stable heart size. Increased patchy opacities within the bilateral lungs, right greater than left. Opacities are in a predominantly peripheral distribution. No large pleural fluid collection. No pneumothorax. IMPRESSION: Increased patchy bilateral airspace opacities suspicious for multifocal atypical/viral pneumonia. Electronically Signed   By: Duanne Guess D.O.   On: 08/03/2019 13:22   DG Chest Port 1 View  Result Date: 08/17/2019 CLINICAL DATA:  64 year old male with shortness of breath. Positive COVID-19. EXAM: PORTABLE CHEST 1 VIEW COMPARISON:  Chest radiograph dated 01/30/2015. FINDINGS: There is diffuse interstitial and vascular prominence most likely mild vascular congestion. Minimal hazy density in the mid to lower lung field, likely combination of vascular congestion and atelectasis. Atypical infection is less likely but not excluded. Clinical correlation is recommended. No focal consolidation, pleural effusion, or pneumothorax. Top-normal cardiac size. Median sternotomy wires and CABG vascular clips. Atherosclerotic calcification of the aorta. No acute osseous pathology. IMPRESSION: 1. Mild vascular congestion. 2. Minimal hazy density in the mid to lower lung field, likely combination of vascular congestion and atelectasis. Atypical infection is less likely but not excluded. No focal consolidation. Electronically Signed   By: Elgie Collard M.D.   On: 08/17/2019 16:49   [place] EKG SITE RITE  Result Date: 08/23/2019 If Site Rite  image not attached, placement could not be confirmed due to current cardiac rhythm.    Huey Bienenstock M.D on 08/23/2019 at 2:55 PM  Between 7am to 7pm - Pager - [phone]  After 7pm go to [email or web address] - password [facility]  [facility] -  Office  [phone]

## 2019-08-23 NOTE — Progress Notes (Signed)
NAME:  [name] [name], MRN:  [id number], DOB:  [birth year], LOS: 3 ADMISSION DATE:  09/14/2019, CONSULTATION DATE: 08/21/2019 REFERRING MD: Dr. Randol Kern, CHIEF COMPLAINT: Hypoxemic respiratory failure  Brief History   64 year old man with CAD, hypertension, diabetes, OSA.  Admitted with COVID-19 pneumonia and hypoxemic respiratory failure 12/30.  History of present illness   64 year old man with hypertension, hyperlipidemia, CAD, diabetes, OSA.  Admitted 12/30 with a week of flulike symptoms, was COVID-19 positive 12/27.  Noted to have hypoxemic respiratory failure and bilateral patchy pulmonary infiltrates, transaminitis, elevated inflammatory markers on arrival to [facility] ED.  Started dexamethasone, remdesivir.  He has experienced tachypnea, transient desaturations, moved for ICU monitoring requiring 1.00 NRB plus HHF [place].   Past Medical History   has a past medical history of Chest pain, Coronary artery disease, Diabetes mellitus without complication (HCC), Dyspnea, Hyperlipidemia, Hypertension, Right bundle branch block, and Sleep apnea.   Significant Hospital Events   Admit 12/30 To ICU on 1.00 NRB, [facility]NC  Consults:  PCCM 12/31  Procedures:    Significant Diagnostic Tests:  Chest x-ray 12/30 >> scattered patchy bilateral pulmonary infiltrates  Micro Data:  SARS CoV 2 12/27 >> positive MRSA screen 12/31 >> negative Blood 12/30 >>  Antimicrobials:  Remdesivir 12/30 >> Tocilizumab x1, 12/30 Azithromycin 12/31 >>  Ceftriaxone 12/31 >>   Interim history/subjective:  Progressive hypoxia, confusion, increasing WOB Now on 60L/min  Objective   Blood pressure 139/73, pulse 91, temperature 98.1 F (36.7 C), temperature source Axillary, resp. rate (!) 45, height 5\' 10"  (1.778 m), weight 88 kg, SpO2 (!) 86 %.    FiO2 (%):  [100 %] 100 %   Intake/Output Summary (Last 24 hours) at 08/23/2019 1310 Last data filed at 08/22/2019 2000 Gross per 24 hour  Intake 100 ml  Output 1550  ml  Net -1450 ml   Filed Weights   September 14, 2019 1249 Sep 14, 2019 2134  Weight: 91.2 kg 88 kg    Examination: General: Ill-appearing man,moderate resp distress HENT: Oropharynx a bit dry, pupils equal, no lymphadenopathy Lungs: B insp crackles Cardiovascular:tachy, regular, no M Abdomen: non-distended Extremities: no edema Neuro: awake but confused, a bit agitated, moves all ext GU: Deferred  Resolved Hospital Problem list     Assessment & Plan:  Acute hypoxemic respiratory failure due to COVID-19 pneumonia/pneumonitis.    Needs intubation now. I discussed with his wife by [phone. Proceed now Low VT vent strategy, ARDS protocol Titrate FiO2 and PEEP based on first ABG Goal Pplat , 30, driving pressure < 15 Remdesivir, dexamethasone as ordered Will continue empiric addition of azithromycin, ceftriaxone to cover for possible superimposed bacterial CAP May require paralytiocs, prone positioning depending on gas exchange once intubated.  Goal CVP 5-8, diuresis as able.   DM 2 with hyperglycemia SSi per protocol Levimir as ordered  Transaminitis Follow LFT, esp on remdesivir  Hx HTN Hold home BP regimen as sedation initiated  CAD Plavix and statin    Best practice:  Diet: NPO Pain/Anxiety/Delirium protocol (if indicated): PAD versed, fentanyl VAP protocol (if indicated): N/A DVT prophylaxis:enoxaparin GI prophylaxis: Pepcid Glucose control: Sliding scale insulin, Levemir Mobility: Bedrest Code Status: Full Family Communication: Discussed with wife 1/2 Disposition: ICU  Labs   CBC: Recent Labs  Lab 08/17/19 1637 Sep 14, 2019 1411 08/21/19 0210 08/22/19 0315 08/23/19 0530  WBC 4.7 6.4 5.0 6.4 6.4  NEUTROABS 3.4 4.8 4.2 4.5 5.0  HGB 15.5 14.2 14.0 12.4* 13.3  HCT 46.7 43.6 42.0 36.7* 38.7*  MCV 94.5 94.6 93.5  90.6 89.4  PLT 148* 208 191 249 287    Basic Metabolic Panel: Recent Labs  Lab 08/17/19 1637 08/18/2019 1411 08/21/19 0210 08/22/19 0315  08/23/19 0530  NA 132* 136 141 144 144  K 3.9 3.7 4.4 3.3* 3.0*  CL 94* 97* 102 110 109  CO2 21* 18* 19* 20* 22  GLUCOSE 238* 236* 196* 164* 109*  BUN 24* 30* 29* 34* 30*  CREATININE 1.04 1.05 0.93 0.84 0.70  CALCIUM 8.1* 8.1* 7.7* 7.9* 7.7*   GFR: Estimated Creatinine Clearance: 104.2 mL/min (by C-G formula based on SCr of 0.7 mg/dL). Recent Labs  Lab 08/17/19 1637 08/17/19 1824 07/28/2019 1410 08/10/2019 1411 07/22/2019 1604 08/21/19 0210 08/22/19 0315 08/23/19 0530  PROCALCITON 0.18  --   --  0.60  --   --  0.26 0.11  WBC 4.7  --   --  6.4  --  5.0 6.4 6.4  LATICACIDVEN 2.5* 2.2* 2.9*  --  2.9*  --   --   --     Liver Function Tests: Recent Labs  Lab 08/17/19 1637 08/18/2019 1411 08/21/19 0210 08/22/19 0315 08/23/19 0530  AST 58* 139* 104* 79* 92*  ALT 33 68* 51* 47* 46*  ALKPHOS 64 73 64 73 95  BILITOT 1.3* 1.4* 1.3* 1.2 1.1  PROT 7.6 7.0 6.0* 5.8* 5.8*  ALBUMIN 3.6 3.0* 2.6* 2.7* 2.7*   No results for input(s): LIPASE, AMYLASE in the last 168 hours. No results for input(s): AMMONIA in the last 168 hours.  ABG    Component Value Date/Time   HCO3 22.3 08/15/2019 1638   TCO2 20 01/30/2015 1808   ACIDBASEDEF 2.3 (H) 08/13/2019 1638   O2SAT 66.9 08/09/2019 1638     Coagulation Profile: No results for input(s): INR, PROTIME in the last 168 hours.  Cardiac Enzymes: No results for input(s): CKTOTAL, CKMB, CKMBINDEX, TROPONINI in the last 168 hours.  HbA1C: HB A1C (BAYER DCA - WAIVED)  Date/Time Value Ref Range Status  08/11/2019 08:04 AM 8.3 (H) <7.0 % Final    Comment:                                          Diabetic Adult            <7.0                                       Healthy Adult        4.3 - 5.7                                                           (DCCT/NGSP) American Diabetes Association's Summary of Glycemic Recommendations for Adults with Diabetes: Hemoglobin A1c <7.0%. More stringent glycemic goals (A1c <6.0%) may further reduce  complications at the cost of increased risk of hypoglycemia.   05/20/2019 09:09 AM 6.7 <7.0 % Final    Comment:  Diabetic Adult            <7.0                                       Healthy Adult        4.3 - 5.7                                                           (DCCT/NGSP) American Diabetes Association's Summary of Glycemic Recommendations for Adults with Diabetes: Hemoglobin A1c <7.0%. More stringent glycemic goals (A1c <6.0%) may further reduce complications at the cost of increased risk of hypoglycemia.    Hgb A1c MFr Bld  Date/Time Value Ref Range Status  08/22/2019 03:15 AM 8.7 (H) 4.8 - 5.6 % Final    Comment:    (NOTE) Pre diabetes:          5.7%-6.4% Diabetes:              >6.4% Glycemic control for   <7.0% adults with diabetes     CBG: Recent Labs  Lab 08/22/19 1221 08/22/19 1743 08/22/19 2153 08/23/19 0807 08/23/19 1157  GLUCAP 96 285* 140* 136* 160*     Critical care time:60 min     Baltazar Apo, MD, PhD 08/23/2019, 1:10 PM [facility] min [phone] or if no answer [phone]

## 2019-08-23 NOTE — Progress Notes (Signed)
Occupational Therapy Evaluation Patient Details Name: [name] [name] MRN: [id number] DOB: 05/17/1956 Today's Date: 08/23/2019    History of Present Illness 64 y.o. male with a past medical history significant for CAD status post bypass grafting in 1996 and SVG was stented in 2006, hyperlipidemia, hypertension, right bundle branch block, sleep apnea on CPAP machine who presents to the ED due to gradual onset of progressively worsening COVID-like symptoms.   Clinical Impression   Limited assessment due to medical status. Assisted nsg with repositioning pt in bed and cleaning bed linens. Pt trying to assist with mobility. Pt apparently from home, lives with his wife and was independent. On 60L HHFNC and NRB. Desats with increased HR with mobility.Increased confusion. Per MD, high risk for intubation. Will reassess DC venue when pt becomes more medically appropriate and able to participate with therapy.     Follow Up Recommendations  Other (comment)(TBA as pt progresses)    Equipment Recommendations  3 in 1 bedside commode    Recommendations for Other Services PT consult     Precautions / Restrictions Precautions Precautions: Fall Precaution Comments: high O2 needs      Mobility Bed Mobility Overal bed mobility: Needs Assistance Bed Mobility: Rolling Rolling: Min assist            Transfers                 General transfer comment: not attempted    Balance                                           ADL either performed or assessed with clinical judgement   ADL Overall ADL's : Needs assistance/impaired Eating/Feeding: Set up;Supervision/ safety   Grooming: Minimal assistance                         Toileting - Clothing Manipulation Details (indicate cue type and reason): Pt able to manipulate urinal     Functional mobility during ADLs: Minimal assistance(bed mobility) General ADL Comments: Will further assess     Vision          Perception     Praxis      Pertinent Vitals/Pain Pain Assessment: Faces Faces Pain Scale: Hurts even more Pain Location: discomfort wiht breathing Pain Descriptors / Indicators: Discomfort Pain Intervention(s): Limited activity within patient's tolerance     Hand Dominance Right   Extremity/Trunk Assessment Upper Extremity Assessment Upper Extremity Assessment: Generalized weakness   Lower Extremity Assessment Lower Extremity Assessment: Defer to PT evaluation   Cervical / Trunk Assessment Cervical / Trunk Assessment: Normal   Communication Communication Communication: No difficulties   Cognition Arousal/Alertness: Lethargic Behavior During Therapy: Restless;Anxious;Impulsive Overall Cognitive Status: Impaired/Different from baseline Area of Impairment: Awareness;Attention                   Current Attention Level: Sustained       Awareness: Emergent   General Comments: Pt became more confused as session progressed   General Comments       Exercises     Shoulder Instructions      Home Living Family/patient expects to be discharged to:: Other (Comment)(TBA) Living Arrangements: Spouse/significant other  Additional Comments: will need to get home information      Prior Functioning/Environment Level of Independence: Independent                 OT Problem List: Decreased strength;Decreased activity tolerance;Decreased cognition;Decreased safety awareness;Cardiopulmonary status limiting activity;Pain      OT Treatment/Interventions: Self-care/ADL training;Therapeutic exercise;Neuromuscular education;Energy conservation;DME and/or AE instruction;Therapeutic activities;Cognitive remediation/compensation;Patient/family education;Balance training    OT Goals(Current goals can be found in the care plan section) Acute Rehab OT Goals Patient Stated Goal: to breath better OT Goal Formulation: Patient unable  to participate in goal setting Time For Goal Achievement: 09/06/19 Potential to Achieve Goals: Good  OT Frequency: Min 2X/week   Barriers to D/C:            Co-evaluation              AM-PAC OT "6 Clicks" Daily Activity     Outcome Measure Help from another person eating meals?: A Little Help from another person taking care of personal grooming?: A Little Help from another person toileting, which includes using toliet, bedpan, or urinal?: A Little Help from another person bathing (including washing, rinsing, drying)?: A Lot Help from another person to put on and taking off regular upper body clothing?: A Lot Help from another person to put on and taking off regular lower body clothing?: A Lot 6 Click Score: 15   End of Session Equipment Utilized During Treatment: Oxygen Nurse Communication: Mobility status  Activity Tolerance: Treatment limited secondary to medical complications (Comment) Patient left: in bed;Other (comment)(seated in upright position)  OT Visit Diagnosis: Muscle weakness (generalized) (M62.81);Other symptoms and signs involving cognitive function                Time: 3149-7026 OT Time Calculation (min): 25 min Charges:  OT General Charges $OT Visit: 1 Visit OT Evaluation $OT Eval High Complexity: 1 High  Malie Kashani, OT/L   Acute OT Clinical Specialist Acute Rehabilitation Services Pager [phone] Office [phone]   [facility] 08/23/2019, 11:14 AM

## 2019-08-23 NOTE — Procedures (Signed)
Central Venous Catheter Insertion Procedure Note [name] [name] [id number] Sep 20, 1955  Procedure: Insertion of Central Venous Catheter Indications: Drug and/or fluid administration  Procedure Details Consent: Risks of procedure as well as the alternatives and risks of each were explained to the (patient/caregiver).  Consent for procedure obtained. Time Out: Verified patient identification, verified procedure, site/side was marked, verified correct patient position, special equipment/implants available, medications/allergies/relevent history reviewed, required imaging and test results available.  Performed  Maximum sterile technique was used including antiseptics, cap, gloves, gown, hand hygiene, mask and sheet. Skin prep: Chlorhexidine; local anesthetic administered A antimicrobial bonded/coated triple lumen catheter was placed in the right internal jugular vein using the Seldinger technique.  Evaluation Blood flow good Complications: No apparent complications Patient did tolerate procedure well. Chest X-ray ordered to verify placement.  CXR: pending.   [name], MD, PhD 08/23/2019, 1:09 PM [place] [phone] or if no answer [phone]

## 2019-08-23 NOTE — Procedures (Signed)
Intubation Procedure Note JAQUANN GUARISCO [id number] 1956/06/23  Procedure: Intubation Indications: Respiratory insufficiency  Procedure Details Consent: Risks of procedure as well as the alternatives and risks of each were explained to the (patient/caregiver).  Consent for procedure obtained. Time Out: Verified patient identification, verified procedure, site/side was marked, verified correct patient position, special equipment/implants available, medications/allergies/relevent history reviewed, required imaging and test results available.  Performed  Maximum sterile technique was used including cap, gloves, gown, hand hygiene and mask.  Mac4 Gliodescopr  7.5 ETT placed without difficulty, 1st pass. No significant secretions. Placement confirmed by ETCO2, direct vis, auscultation.     Evaluation Hemodynamic Status: BP stable throughout; O2 sats: transiently fell during during procedure Patient's Current Condition: stable Complications: No apparent complications Patient did tolerate procedure well. Chest X-ray ordered to verify placement.  CXR: pending.   [name], MD, PhD 08/23/2019, 1:08 PM [place] [phone] or if no answer [phone]

## 2019-08-24 ENCOUNTER — Inpatient Hospital Stay (HOSPITAL_COMMUNITY): Payer: BC Managed Care – PPO

## 2019-08-24 LAB — POCT I-STAT 7, (LYTES, BLD GAS, ICA,H+H)
Bicarbonate: 28.8 mmol/L — ABNORMAL HIGH (ref 20.0–28.0)
Calcium, Ion: 1.24 mmol/L (ref 1.15–1.40)
HCT: 42 % (ref 39.0–52.0)
Hemoglobin: 14.3 g/dL (ref 13.0–17.0)
O2 Saturation: 99 %
Patient temperature: 98.1
Potassium: 4 mmol/L (ref 3.5–5.1)
Sodium: 148 mmol/L — ABNORMAL HIGH (ref 135–145)
TCO2: 31 mmol/L (ref 22–32)
pCO2 arterial: 66.7 mmHg (ref 32.0–48.0)
pH, Arterial: 7.242 — ABNORMAL LOW (ref 7.350–7.450)
pO2, Arterial: 158 mmHg — ABNORMAL HIGH (ref 83.0–108.0)

## 2019-08-24 LAB — PROCALCITONIN: Procalcitonin: <0.1 ng/mL

## 2019-08-24 LAB — CBC WITH DIFFERENTIAL/PLATELET
Abs Immature Granulocytes: 0.06 10*3/uL (ref 0.00–0.07)
Basophils Absolute: 0 10*3/uL (ref 0.0–0.1)
Basophils Relative: 0 %
Eosinophils Absolute: 0 10*3/uL (ref 0.0–0.5)
Eosinophils Relative: 0 %
HCT: 44.8 % (ref 39.0–52.0)
Hemoglobin: 14.5 g/dL (ref 13.0–17.0)
Immature Granulocytes: 1 %
Lymphocytes Relative: 11 %
Lymphs Abs: 1.1 10*3/uL (ref 0.7–4.0)
MCH: 31 pg (ref 26.0–34.0)
MCHC: 32.4 g/dL (ref 30.0–36.0)
MCV: 95.7 fL (ref 80.0–100.0)
Monocytes Absolute: 0.2 10*3/uL (ref 0.1–1.0)
Monocytes Relative: 2 %
Neutro Abs: 8.4 10*3/uL — ABNORMAL HIGH (ref 1.7–7.7)
Neutrophils Relative %: 86 %
Platelets: 317 10*3/uL (ref 150–400)
RBC: 4.68 MIL/uL (ref 4.22–5.81)
RDW: 14.8 % (ref 11.5–15.5)
WBC: 9.8 10*3/uL (ref 4.0–10.5)
nRBC: 0 % (ref 0.0–0.2)

## 2019-08-24 LAB — D-DIMER, QUANTITATIVE: D-Dimer, Quant: 2.52 ug/mL-FEU — ABNORMAL HIGH (ref 0.00–0.50)

## 2019-08-24 LAB — COMPREHENSIVE METABOLIC PANEL
ALT: 46 U/L — ABNORMAL HIGH (ref 0–44)
AST: 84 U/L — ABNORMAL HIGH (ref 15–41)
Albumin: 2.9 g/dL — ABNORMAL LOW (ref 3.5–5.0)
Alkaline Phosphatase: 105 U/L (ref 38–126)
Anion gap: 9 (ref 5–15)
BUN: 31 mg/dL — ABNORMAL HIGH (ref 8–23)
CO2: 26 mmol/L (ref 22–32)
Calcium: 7.8 mg/dL — ABNORMAL LOW (ref 8.9–10.3)
Chloride: 112 mmol/L — ABNORMAL HIGH (ref 98–111)
Creatinine, Ser: 0.86 mg/dL (ref 0.61–1.24)
GFR calc Af Amer: >60 mL/min (ref >60)
GFR calc non Af Amer: >60 mL/min (ref >60)
Glucose, Bld: 108 mg/dL — ABNORMAL HIGH (ref 70–99)
Potassium: 4.2 mmol/L (ref 3.5–5.1)
Sodium: 147 mmol/L — ABNORMAL HIGH (ref 135–145)
Total Bilirubin: 0.6 mg/dL (ref 0.3–1.2)
Total Protein: 6.1 g/dL — ABNORMAL LOW (ref 6.5–8.1)

## 2019-08-24 LAB — GLUCOSE, CAPILLARY: Glucose-Capillary: 114 mg/dL — ABNORMAL HIGH (ref 70–99)

## 2019-08-24 LAB — C-REACTIVE PROTEIN: CRP: 1.7 mg/dL — ABNORMAL HIGH (ref <1.0)

## 2019-08-24 MED ORDER — FUROSEMIDE 10 MG/ML IJ SOLN
40.0000 mg | Freq: Two times a day (BID) | INTRAMUSCULAR | Status: AC
  Administered 2019-08-24 (×2): 40 mg via INTRAVENOUS
  Filled 2019-08-24 (×2): qty 4

## 2019-08-24 MED ORDER — FAMOTIDINE 40 MG/5ML PO SUSR
20.0000 mg | Freq: Two times a day (BID) | ORAL | Status: DC
  Administered 2019-08-24 – 2019-09-11 (×36): 20 mg
  Filled 2019-08-24 (×36): qty 2.5

## 2019-08-24 NOTE — Progress Notes (Signed)
NAME:  [name] [name], MRN:  [id number], DOB:  Jan 26, 1956, LOS: 4 ADMISSION DATE:  07/26/2019, CONSULTATION DATE: 08/21/2019 REFERRING MD: Dr. Randol Kern, CHIEF COMPLAINT: Hypoxemic respiratory failure  Brief History   64 year old man with CAD, hypertension, diabetes, OSA.  Admitted with COVID-19 pneumonia and hypoxemic respiratory failure 12/30.  History of present illness   64 year old man with hypertension, hyperlipidemia, CAD, diabetes, OSA.  Admitted 12/30 with a week of flulike symptoms, was COVID-19 positive 12/27.  Noted to have hypoxemic respiratory failure and bilateral patchy pulmonary infiltrates, transaminitis, elevated inflammatory markers on arrival to [facility] ED.  Started dexamethasone, remdesivir.  He has experienced tachypnea, transient desaturations, moved for ICU monitoring requiring 1.00 NRB plus HHF [place].   Past Medical History   has a past medical history of Chest pain, Coronary artery disease, Diabetes mellitus without complication (HCC), Dyspnea, Hyperlipidemia, Hypertension, Right bundle branch block, and Sleep apnea.   Significant Hospital Events   Admit 12/30 To ICU on 1.00 NRB, HHFNC  Consults:  PCCM 12/31  Procedures:  ET tube 1/2 >>  Right IJ CVC 1/2 >>   Significant Diagnostic Tests:  Chest x-ray 12/30 >> scattered patchy bilateral pulmonary infiltrates  Micro Data:  SARS CoV 2 12/27 >> positive MRSA screen 12/31 >> negative Blood 12/30 >>  Antimicrobials:  Remdesivir 12/30 >> Tocilizumab x1, 12/30 Azithromycin 12/31 >>  Ceftriaxone 12/31 >>   Interim history/subjective:  Intubated 1/2 for progressive respiratory failure I and O cath overnight FiO2 0.80, PEEP 14 on paralytics >> weaning successfully this am, now 0.60 + PEEP 12   Objective   Blood pressure 110/62, pulse 98, temperature 98.5 F (36.9 C), temperature source Axillary, resp. rate (!) 30, height 5\' 10"  (1.778 m), weight 88 kg, SpO2 96 %.    Vent Mode: PRVC FiO2 (%):  [60  %-100 %] 80 % Set Rate:  [20 bmp-30 bmp] 30 bmp Vt Set:  [430 mL-500 mL] 500 mL PEEP:  [10 cmH20-14 cmH20] 14 cmH20 Plateau Pressure:  [24 cmH20-31 cmH20] 31 cmH20   Intake/Output Summary (Last 24 hours) at 08/24/2019 0935 Last data filed at 08/24/2019 0500 Gross per 24 hour  Intake 1951.07 ml  Output 1695 ml  Net 256.07 ml   Filed Weights   07/25/2019 1249 07/25/2019 2134  Weight: 91.2 kg 88 kg    Examination: General: Ill-appearing man,moderate resp distress HENT: Oropharynx a bit dry, pupils equal, no lymphadenopathy Lungs: B insp crackles Cardiovascular:tachy, regular, no M Abdomen: non-distended Extremities: no edema Neuro: sedated and paralyzed GU: Deferred  Resolved Hospital Problem list     Assessment & Plan:  Acute hypoxemic respiratory failure due to COVID-19 pneumonia/pneumonitis.    PRVC 6 cc/kg Goal Pplat , 30, driving pressure < 15 Continue remdesivir, dexamethasone as ordered Continue paralytics for another day, then try to lift Start cyclic prone positioning on 1/3 if unable to wean PEEP, FiO2 further Diuresis as able, goal CVP 5-8; lasix given 1/3 VAP prevention order set  Possible superimposed community-acquired pneumonia Currently covered with azithromycin, ceftriaxone.  Plan to cover for 5 days and then discontinue  DM 2 with hyperglycemia Sliding scale insulin as per protocol Levemir 28 units daily  Transaminitis Follow LFT on remdesivir, slightly elevated but stable  Hx HTN Hold home blood pressure regimen while sedated  CAD Plavix and statin  Toxic metabolic encephalopathy, need for sedation to facilitate mechanical ventilation Fentanyl, Versed infusions per PAD protocol  History depression Lexapro, Wellbutrin    Best practice:  Diet: NPO Pain/Anxiety/Delirium  protocol (if indicated): PAD versed, fentanyl VAP protocol (if indicated): N/A DVT prophylaxis:enoxaparin GI prophylaxis: Pepcid Glucose control: Sliding scale insulin,  Levemir Mobility: Bedrest Code Status: Full Family Communication: Discussed with wife on 1/3 Disposition: ICU  Labs   CBC: Recent Labs  Lab 07/25/2019 1411 08/21/19 0210 08/22/19 0315 08/23/19 0530 08/23/19 1416 08/24/19 0258 08/24/19 0338  WBC 6.4 5.0 6.4 6.4  --  9.8  --   NEUTROABS 4.8 4.2 4.5 5.0  --  8.4*  --   HGB 14.2 14.0 12.4* 13.3 13.9 14.5 14.3  HCT 43.6 42.0 36.7* 38.7* 41.0 44.8 42.0  MCV 94.6 93.5 90.6 89.4  --  95.7  --   PLT 208 191 249 287  --  317  --     Basic Metabolic Panel: Recent Labs  Lab 07/25/2019 1411 08/21/19 0210 08/22/19 0315 08/23/19 0530 08/23/19 1416 08/24/19 0258 08/24/19 0338  NA 136 141 144 144 147* 147* 148*  K 3.7 4.4 3.3* 3.0* 3.1* 4.2 4.0  CL 97* 102 110 109  --  112*  --   CO2 18* 19* 20* 22  --  26  --   GLUCOSE 236* 196* 164* 109*  --  108*  --   BUN 30* 29* 34* 30*  --  31*  --   CREATININE 1.05 0.93 0.84 0.70  --  0.86  --   CALCIUM 8.1* 7.7* 7.9* 7.7*  --  7.8*  --    GFR: Estimated Creatinine Clearance: 97 mL/min (by C-G formula based on SCr of 0.86 mg/dL). Recent Labs  Lab 08/17/19 1637 08/17/19 1824 07/23/2019 1410 08/02/2019 1411 07/25/2019 1604 08/21/19 0210 08/22/19 0315 08/23/19 0530 08/24/19 0258  PROCALCITON 0.18  --   --  0.60  --   --  0.26 0.11 <0.10  WBC 4.7  --   --  6.4  --  5.0 6.4 6.4 9.8  LATICACIDVEN 2.5* 2.2* 2.9*  --  2.9*  --   --   --   --     Liver Function Tests: Recent Labs  Lab 07/25/2019 1411 08/21/19 0210 08/22/19 0315 08/23/19 0530 08/24/19 0258  AST 139* 104* 79* 92* 84*  ALT 68* 51* 47* 46* 46*  ALKPHOS 73 64 73 95 105  BILITOT 1.4* 1.3* 1.2 1.1 0.6  PROT 7.0 6.0* 5.8* 5.8* 6.1*  ALBUMIN 3.0* 2.6* 2.7* 2.7* 2.9*   No results for input(s): LIPASE, AMYLASE in the last 168 hours. No results for input(s): AMMONIA in the last 168 hours.  ABG    Component Value Date/Time   PHART 7.242 (L) 08/24/2019 0338   PCO2ART 66.7 (HH) 08/24/2019 0338   PO2ART 158.0 (H) 08/24/2019 0338    HCO3 28.8 (H) 08/24/2019 0338   TCO2 31 08/24/2019 0338   ACIDBASEDEF 2.0 08/23/2019 1416   O2SAT 99.0 08/24/2019 0338     Coagulation Profile: No results for input(s): INR, PROTIME in the last 168 hours.  Cardiac Enzymes: No results for input(s): CKTOTAL, CKMB, CKMBINDEX, TROPONINI in the last 168 hours.  HbA1C: HB A1C (BAYER DCA - WAIVED)  Date/Time Value Ref Range Status  08/11/2019 08:04 AM 8.3 (H) <7.0 % Final    Comment:                                          Diabetic Adult            <  7.0                                       Healthy Adult        4.3 - 5.7                                                           (DCCT/NGSP) American Diabetes Association's Summary of Glycemic Recommendations for Adults with Diabetes: Hemoglobin A1c <7.0%. More stringent glycemic goals (A1c <6.0%) may further reduce complications at the cost of increased risk of hypoglycemia.   05/20/2019 09:09 AM 6.7 <7.0 % Final    Comment:                                          Diabetic Adult            <7.0                                       Healthy Adult        4.3 - 5.7                                                           (DCCT/NGSP) American Diabetes Association's Summary of Glycemic Recommendations for Adults with Diabetes: Hemoglobin A1c <7.0%. More stringent glycemic goals (A1c <6.0%) may further reduce complications at the cost of increased risk of hypoglycemia.    Hgb A1c MFr Bld  Date/Time Value Ref Range Status  08/22/2019 03:15 AM 8.7 (H) 4.8 - 5.6 % Final    Comment:    (NOTE) Pre diabetes:          5.7%-6.4% Diabetes:              >6.4% Glycemic control for   <7.0% adults with diabetes     CBG: Recent Labs  Lab 08/22/19 2153 08/23/19 0807 08/23/19 1157 08/23/19 1549 08/23/19 2151  GLUCAP 140* 136* 160* 168* 123*     Critical care time:  min     Baltazar Apo, MD, PhD 08/24/2019, 9:35 AM [place] min [phone] or if no  answer [phone]

## 2019-08-24 NOTE — Progress Notes (Signed)
eLink Physician-Brief Progress Note Patient Name: [name] [name] DOB: Dec 20, 1955 MRN: [id number]   Date of Service  08/24/2019  HPI/Events of Note  Oliguria - Bladder scan with > 800 mL residual.  eICU Interventions  Will order: 1. I/O Cath PRN.      Intervention Category Intermediate Interventions: Oliguria - evaluation and management  Talana Slatten [place] 08/24/2019, 4:40 AM

## 2019-08-25 ENCOUNTER — Inpatient Hospital Stay (HOSPITAL_COMMUNITY): Payer: BC Managed Care – PPO

## 2019-08-25 DIAGNOSIS — J80 Acute respiratory distress syndrome: Secondary | ICD-10-CM

## 2019-08-25 LAB — C-REACTIVE PROTEIN: CRP: 0.9 mg/dL (ref <1.0)

## 2019-08-25 LAB — POCT I-STAT 7, (LYTES, BLD GAS, ICA,H+H)
Acid-Base Excess: 2 mmol/L (ref 0.0–2.0)
Acid-base deficit: 2 mmol/L (ref 0.0–2.0)
Bicarbonate: 27.9 mmol/L (ref 20.0–28.0)
Bicarbonate: 26.3 mmol/L (ref 20.0–28.0)
Calcium, Ion: 1.21 mmol/L (ref 1.15–1.40)
Calcium, Ion: 1.19 mmol/L (ref 1.15–1.40)
HCT: 43 % (ref 39.0–52.0)
HCT: 46 % (ref 39.0–52.0)
Hemoglobin: 14.6 g/dL (ref 13.0–17.0)
Hemoglobin: 15.6 g/dL (ref 13.0–17.0)
O2 Saturation: 81 %
O2 Saturation: 96 %
Patient temperature: 39.2
Patient temperature: 38.8
Potassium: 4.2 mmol/L (ref 3.5–5.1)
Potassium: 4.8 mmol/L (ref 3.5–5.1)
Sodium: 149 mmol/L — ABNORMAL HIGH (ref 135–145)
Sodium: 148 mmol/L — ABNORMAL HIGH (ref 135–145)
TCO2: 29 mmol/L (ref 22–32)
TCO2: 28 mmol/L (ref 22–32)
pCO2 arterial: 52.4 mmHg — ABNORMAL HIGH (ref 32.0–48.0)
pCO2 arterial: 60.8 mmHg — ABNORMAL HIGH (ref 32.0–48.0)
pH, Arterial: 7.344 — ABNORMAL LOW (ref 7.350–7.450)
pH, Arterial: 7.253 — ABNORMAL LOW (ref 7.350–7.450)
pO2, Arterial: 55 mmHg — ABNORMAL LOW (ref 83.0–108.0)
pO2, Arterial: 109 mmHg — ABNORMAL HIGH (ref 83.0–108.0)

## 2019-08-25 LAB — MAGNESIUM: Magnesium: 2.2 mg/dL (ref 1.7–2.4)

## 2019-08-25 LAB — GLUCOSE, CAPILLARY
Glucose-Capillary: 227 mg/dL — ABNORMAL HIGH (ref 70–99)
Glucose-Capillary: 117 mg/dL — ABNORMAL HIGH (ref 70–99)
Glucose-Capillary: 102 mg/dL — ABNORMAL HIGH (ref 70–99)
Glucose-Capillary: 90 mg/dL (ref 70–99)
Glucose-Capillary: 135 mg/dL — ABNORMAL HIGH (ref 70–99)
Glucose-Capillary: 176 mg/dL — ABNORMAL HIGH (ref 70–99)
Glucose-Capillary: 155 mg/dL — ABNORMAL HIGH (ref 70–99)
Glucose-Capillary: 114 mg/dL — ABNORMAL HIGH (ref 70–99)
Glucose-Capillary: 129 mg/dL — ABNORMAL HIGH (ref 70–99)
Glucose-Capillary: 158 mg/dL — ABNORMAL HIGH (ref 70–99)
Glucose-Capillary: 194 mg/dL — ABNORMAL HIGH (ref 70–99)
Glucose-Capillary: 282 mg/dL — ABNORMAL HIGH (ref 70–99)

## 2019-08-25 LAB — CBC WITH DIFFERENTIAL/PLATELET
Abs Immature Granulocytes: 0.08 10*3/uL — ABNORMAL HIGH (ref 0.00–0.07)
Basophils Absolute: 0 10*3/uL (ref 0.0–0.1)
Basophils Relative: 0 %
Eosinophils Absolute: 0 10*3/uL (ref 0.0–0.5)
Eosinophils Relative: 0 %
HCT: 43.1 % (ref 39.0–52.0)
Hemoglobin: 13.7 g/dL (ref 13.0–17.0)
Immature Granulocytes: 1 %
Lymphocytes Relative: 14 %
Lymphs Abs: 1 10*3/uL (ref 0.7–4.0)
MCH: 31.1 pg (ref 26.0–34.0)
MCHC: 31.8 g/dL (ref 30.0–36.0)
MCV: 98 fL (ref 80.0–100.0)
Monocytes Absolute: 0.2 10*3/uL (ref 0.1–1.0)
Monocytes Relative: 3 %
Neutro Abs: 5.9 10*3/uL (ref 1.7–7.7)
Neutrophils Relative %: 82 %
Platelets: 273 10*3/uL (ref 150–400)
RBC: 4.4 MIL/uL (ref 4.22–5.81)
RDW: 15.2 % (ref 11.5–15.5)
WBC: 7.3 10*3/uL (ref 4.0–10.5)
nRBC: 0 % (ref 0.0–0.2)

## 2019-08-25 LAB — COMPREHENSIVE METABOLIC PANEL
ALT: 62 U/L — ABNORMAL HIGH (ref 0–44)
AST: 182 U/L — ABNORMAL HIGH (ref 15–41)
Albumin: 2.6 g/dL — ABNORMAL LOW (ref 3.5–5.0)
Alkaline Phosphatase: 83 U/L (ref 38–126)
Anion gap: 11 (ref 5–15)
BUN: 38 mg/dL — ABNORMAL HIGH (ref 8–23)
CO2: 25 mmol/L (ref 22–32)
Calcium: 7.9 mg/dL — ABNORMAL LOW (ref 8.9–10.3)
Chloride: 111 mmol/L (ref 98–111)
Creatinine, Ser: 1.1 mg/dL (ref 0.61–1.24)
GFR calc Af Amer: >60 mL/min (ref >60)
GFR calc non Af Amer: >60 mL/min (ref >60)
Glucose, Bld: 102 mg/dL — ABNORMAL HIGH (ref 70–99)
Potassium: 4.4 mmol/L (ref 3.5–5.1)
Sodium: 147 mmol/L — ABNORMAL HIGH (ref 135–145)
Total Bilirubin: 0.7 mg/dL (ref 0.3–1.2)
Total Protein: 5.7 g/dL — ABNORMAL LOW (ref 6.5–8.1)

## 2019-08-25 LAB — CULTURE, BLOOD (ROUTINE X 2)
Culture: NO GROWTH
Culture: NO GROWTH
Special Requests: ADEQUATE

## 2019-08-25 LAB — PHOSPHORUS: Phosphorus: 3.9 mg/dL (ref 2.5–4.6)

## 2019-08-25 LAB — PROCALCITONIN: Procalcitonin: 0.12 ng/mL

## 2019-08-25 LAB — D-DIMER, QUANTITATIVE: D-Dimer, Quant: 7.93 ug/mL-FEU — ABNORMAL HIGH (ref 0.00–0.50)

## 2019-08-25 MED ORDER — CHLORHEXIDINE GLUCONATE 0.12 % MT SOLN
15.0000 mL | Freq: Two times a day (BID) | OROMUCOSAL | Status: DC
  Administered 2019-08-25 – 2019-09-13 (×38): 15 mL via OROMUCOSAL
  Filled 2019-08-25 (×21): qty 15

## 2019-08-25 MED ORDER — ADULT MULTIVITAMIN W/MINERALS CH: 1.0000 | ORAL_TABLET | Freq: Every day | ORAL | Status: DC

## 2019-08-25 MED ORDER — PRO-STAT SUGAR FREE PO LIQD
60.0000 mL | Freq: Four times a day (QID) | ORAL | Status: DC
  Administered 2019-08-25 – 2019-09-11 (×63): 60 mL
  Filled 2019-08-25 (×62): qty 60

## 2019-08-25 MED ORDER — ADENOSINE 6 MG/2ML IV SOLN: 6.0000 mg | Freq: Once | INTRAVENOUS | Status: AC

## 2019-08-25 MED ORDER — AMIODARONE LOAD VIA INFUSION
150.0000 mg | Freq: Once | INTRAVENOUS | Status: AC
  Administered 2019-08-25: 16:00:00 150 mg via INTRAVENOUS
  Filled 2019-08-25: qty 83.34

## 2019-08-25 MED ORDER — PHENYLEPHRINE HCL-NACL 10-0.9 MG/250ML-% IV SOLN
0.0000 ug/min | INTRAVENOUS | Status: DC
  Filled 2019-08-25: qty 250

## 2019-08-25 MED ORDER — ASPIRIN 81 MG PO CHEW
81.0000 mg | CHEWABLE_TABLET | Freq: Two times a day (BID) | ORAL | Status: DC
  Administered 2019-08-25 – 2019-09-10 (×32): 81 mg via ORAL
  Filled 2019-08-25 (×32): qty 1

## 2019-08-25 MED ORDER — AMIODARONE HCL IN DEXTROSE 360-4.14 MG/200ML-% IV SOLN
30.0000 mg/h | INTRAVENOUS | Status: DC
  Administered 2019-08-25 – 2019-08-29 (×9): 30 mg/h via INTRAVENOUS
  Filled 2019-08-25 (×8): qty 200

## 2019-08-25 MED ORDER — CHLORHEXIDINE GLUCONATE 0.12% ORAL RINSE (MEDLINE KIT)
15.0000 mL | Freq: Two times a day (BID) | OROMUCOSAL | Status: DC
  Administered 2019-08-25: 15 mL via OROMUCOSAL

## 2019-08-25 MED ORDER — ORAL CARE MOUTH RINSE
15.0000 mL | OROMUCOSAL | Status: DC
  Administered 2019-08-25 – 2019-09-13 (×190): 15 mL via OROMUCOSAL

## 2019-08-25 MED ORDER — VITAL 1.5 CAL PO LIQD
1000.0000 mL | ORAL | Status: DC
  Administered 2019-08-26 – 2019-09-08 (×7): 1000 mL

## 2019-08-25 MED ORDER — VITAL HIGH PROTEIN PO LIQD: 1000.0000 mL | ORAL | Status: DC

## 2019-08-25 MED ORDER — ADULT MULTIVITAMIN LIQUID CH
15.0000 mL | Freq: Every morning | ORAL | Status: DC
  Administered 2019-08-25 – 2019-09-10 (×16): 15 mL
  Filled 2019-08-25 (×16): qty 15

## 2019-08-25 MED ORDER — FREE WATER
200.0000 mL | Status: DC
  Administered 2019-08-25 – 2019-08-29 (×23): 200 mL

## 2019-08-25 MED ORDER — POTASSIUM CHLORIDE 20 MEQ/15ML (10%) PO SOLN
40.0000 meq | Freq: Once | ORAL | Status: AC
  Administered 2019-08-25: 12:00:00 40 meq
  Filled 2019-08-25: qty 30

## 2019-08-25 MED ORDER — ADENOSINE 6 MG/2ML IV SOLN
INTRAVENOUS | Status: AC
  Administered 2019-08-25: 6 mg via INTRAVENOUS
  Filled 2019-08-25: qty 6

## 2019-08-25 MED ORDER — PRO-STAT SUGAR FREE PO LIQD: 30.0000 mL | Freq: Two times a day (BID) | ORAL | Status: DC

## 2019-08-25 MED ORDER — ADULT MULTIVITAMIN LIQUID CH: 15.0000 mL | Freq: Every morning | ORAL | Status: DC

## 2019-08-25 MED ORDER — FUROSEMIDE 10 MG/ML IJ SOLN
40.0000 mg | Freq: Two times a day (BID) | INTRAMUSCULAR | Status: AC
  Administered 2019-08-25 (×2): 40 mg via INTRAVENOUS
  Filled 2019-08-25 (×2): qty 4

## 2019-08-25 MED ORDER — AMIODARONE HCL IN DEXTROSE 360-4.14 MG/200ML-% IV SOLN
60.0000 mg/h | INTRAVENOUS | Status: AC
  Administered 2019-08-25 (×2): 60 mg/h via INTRAVENOUS
  Filled 2019-08-25 (×2): qty 200

## 2019-08-25 NOTE — Progress Notes (Signed)
Upon placing patient in prone position, patient became tachycardic with HR up to 160s-170s. Notified Anders Simmonds, NP of patient's HR and Cindee Lame came to bedside. One dose of adenosine given, and amio bolus + infusion started.

## 2019-08-25 NOTE — Progress Notes (Signed)
Pt developed narrow complex tachycardia after placing him in the prone position w/ HR in 160-170s.   I gave him a one time  Dose of 6 mg of adenosine which briefly decreased his HR to 120s and atrial flutter was noted.   Plan Amiodarone load and gtt. Plan for this short term if able   Simonne Martinet ACNP-BC [facility]/Critical Care Pager # [phone] OR # [phone] if no answer

## 2019-08-25 NOTE — Progress Notes (Addendum)
NAME:  [name] [name], MRN:  [id number], DOB:  07-16-1956, LOS: 5 ADMISSION DATE:  09/11/19, CONSULTATION DATE: 08/21/2019 REFERRING MD: Dr. Waldron Labs, CHIEF COMPLAINT: Hypoxemic respiratory failure  Brief History   64 year old man with CAD, hypertension, diabetes, OSA.  Admitted with COVID-19 pneumonia and hypoxemic respiratory failure 12/30.  Past Medical History   has a past medical history of Chest pain, Coronary artery disease, Diabetes mellitus without complication ([place]), Dyspnea, Hyperlipidemia, Hypertension, Right bundle branch block, and Sleep apnea.   Significant Hospital Events   Admit 12/30 To ICU on 1.00 NRB, HHFNC.  Decadron started.  Remdesivir started,Tocilizumab x1, 12/30  12/31 empiric abx started 1/2 PCCM consulted. Intubated.  1/3 requiring neuromuscular blockade, PEEP and FiO2 improving.  Completed remdesivir  Consults:  PCCM 12/31  Procedures:  ET tube 1/2 >>  Right IJ CVC 1/2 >>   Significant Diagnostic Tests:  Chest x-ray 12/30 >> scattered patchy bilateral pulmonary infiltrates  Micro Data:  SARS CoV 2 12/27 >> positive MRSA screen 12/31 >> negative Blood 12/30 >> Respiratory culture 1/4>>>  Antimicrobials:  Remdesivir 12/30 >> 1/3 Tocilizumab x1, 12/30 Azithromycin 12/31 >> 1/4 Ceftriaxone 12/31 >> 1/4  Interim history/subjective:  Spiking fever  Objective   Blood pressure 140/60, pulse (Abnormal) 115, temperature (Abnormal) 100.8 F (38.2 C), resp. rate (Abnormal) 30, height 5\' 10"  (1.778 m), weight 82.4 kg, SpO2 (Abnormal) 87 %. CVP:  [3 mmHg-13 mmHg] 5 mmHg  Vent Mode: PRVC FiO2 (%):  [60 %] 60 % Set Rate:  [30 bmp] 30 bmp Vt Set:  [500 mL] 500 mL PEEP:  [10 cmH20-12 cmH20] 10 cmH20 Plateau Pressure:  [21 cmH20-23 cmH20] 21 cmH20   Intake/Output Summary (Last 24 hours) at 08/25/2019 8338 Last data filed at 08/25/2019 0730 Gross per 24 hour  Intake 1732.65 ml  Output 905 ml  Net 827.65 ml   Filed Weights   2019/09/11 1249 09-11-2019  2134 08/25/19 0300  Weight: 91.2 kg 88 kg 82.4 kg    Examination: General 64 year old white male currently on vent  HENT NCAT no JVD orally intubated pulm scattered rhonchi, equal chest rise bilaterally  Card RRR Ventilator assessment: FiO2:60 PEEP:10 Plateau pressure:21 Driving pressure: 11 abd soft tol tubefeeds  GU cl yellow Neuro heavily sedated and on NMB  Resolved Hospital Problem list     Assessment & Plan:   Acute hypoxemic respiratory failure due to COVID-19 pneumonia/pneumonitis, with possible superimposed community-acquired pneumonia Portable chest x-ray personally reviewed from 1/2 showed bilateral diffuse pulmonary infiltrates -Remdesivir has been completed -crp improved,  Plan Continuing low tidal volume ventilation at 6 mls per kilogram predicted body weight Adjusting PEEP/FiO2 for target PaO2 of 55-65 Plateau pressure goal less than 30, driving pressure goal less than 15 Continuing proning protocol until P/F ratio greater than 150 in the supine position Daily diuresis as BUN/creatinine and blood pressure will allow Target CVP less than 4 if able Continuing heavy sedation with RASS goal of -4; receiving High-dose fentanyl and Versed infusion Continue another 24 hours of continuous Nimbex then transition to as needed on 1/5, will need to add additional oxycodone and clonazepam Completing day #5 of azithromycin and ceftriaxone VAP bundle A.m. chest x-ray Day #6 of 10 Decadron  Fever Plan Culture bcx2 and respiratory   Fluid and electrolyte imbalance: Hypernatremia Plan Free water replacement, will start at 200 mL every 4 hours A.m. chemistry  DM 2 with hyperglycemia Plan Continue Levemir 28 units daily Continue sliding scale insulin  Transaminitis -Remdesivir's discontinued on  1/3 Plan Repeat LFTs in a.m.  Hx HTN Plan Holding antihypertensives while on sedation  CAD Plan Continue aspirin and statin  Toxic metabolic encephalopathy, need  for sedation to facilitate mechanical ventilation and h/o depression Plan Continuing sedation as mentioned above Continue Lexapro and Wellbutrin   Best practice:  Diet: NPO tubefeeds 1/4 Pain/Anxiety/Delirium protocol (if indicated): PAD versed, fentanyl VAP protocol (if indicated): N/A DVT prophylaxis:enoxaparin GI prophylaxis: Pepcid Glucose control: Sliding scale insulin, Levemir Mobility: Bedrest Code Status: Full Family Communication: Discussed with wife on 1/3 Disposition: ICU  My critical care time 33 minutes.   Simonne Martinet ACNP-BC [facility] Pulmonary/Critical Care Pager # [phone] OR # [phone] if no answer

## 2019-08-25 NOTE — Progress Notes (Signed)
Pt proned at 1510.  Head positioned to right.  Arms positioned accordingly.  ETT is secured with cloth tape.  Mepilex applied for skin protection.

## 2019-08-25 NOTE — Progress Notes (Signed)
  Amiodarone Drug - Drug Interaction Consult Note  Recommendations: Monitor for rhabdo with statins  Amiodarone is metabolized by the cytochrome P450 system and therefore has the potential to cause many drug interactions. Amiodarone has an average plasma half-life of 50 days (range 20 to 100 days).   There is potential for drug interactions to occur several weeks or months after stopping treatment and the onset of drug interactions may be slow after initiating amiodarone.   [x]  Statins: Increased risk of myopathy. Simvastatin- restrict dose to 20mg  daily. Other statins: counsel patients to report any muscle pain or weakness immediately.  []  Anticoagulants: Amiodarone can increase anticoagulant effect. Consider warfarin dose reduction. Patients should be monitored closely and the dose of anticoagulant altered accordingly, remembering that amiodarone levels take several weeks to stabilize.  []  Antiepileptics: Amiodarone can increase plasma concentration of phenytoin, the dose should be reduced. Note that small changes in phenytoin dose can result in large changes in levels. Monitor patient and counsel on signs of toxicity.  []  Beta blockers: increased risk of bradycardia, AV block and myocardial depression. Sotalol - avoid concomitant use.  []   Calcium channel blockers (diltiazem and verapamil): increased risk of bradycardia, AV block and myocardial depression.  []   Cyclosporine: Amiodarone increases levels of cyclosporine. Reduced dose of cyclosporine is recommended.  []  Digoxin dose should be halved when amiodarone is started.  []  Diuretics: increased risk of cardiotoxicity if hypokalemia occurs.  []  Oral hypoglycemic agents (glyburide, glipizide, glimepiride): increased risk of hypoglycemia. Patient's glucose levels should be monitored closely when initiating amiodarone therapy.   []  Drugs that prolong the QT interval:  Torsades de pointes risk may be increased with concurrent use -  avoid if possible.  Monitor QTc, also keep magnesium/potassium WNL if concurrent therapy can't be avoided. Antibiotics: e.g. fluoroquinolones, erythromycin. . Antiarrhythmics: e.g. quinidine, procainamide, disopyramide, sotalol. . Antipsychotics: e.g. phenothiazines, haloperidol.  . Lithium, tricyclic antidepressants, and methadone.  , PharmD, BCIDP, AAHIVP, CPP Infectious Disease Pharmacist 08/25/2019 4:30 PM

## 2019-08-25 NOTE — Progress Notes (Signed)
Attempted to call spouse, Lanora Manis, to provided update at 2114 with no response. Daughter called and provided with update. All questions answered at present time.

## 2019-08-25 NOTE — Progress Notes (Signed)
Spouse, Lanora Manis, updated via phone on patient this morning. All questions answered at present time.

## 2019-08-25 NOTE — Progress Notes (Signed)
PT Cancellation Note  Patient Details Name: [name] [name] MRN: [id number] DOB: 1956/08/03   Cancelled Treatment:    Reason Eval/Treat Not Completed: Medical issues which prohibited therapy, on vent   [name] 08/25/2019, 5:32 PM

## 2019-08-25 NOTE — Progress Notes (Signed)
 Initial Nutrition Assessment  DOCUMENTATION CODES:   Not applicable  INTERVENTION:   Tube Feeding:  Vital 1.5 at 40 ml/hr Pro-Stat 60 mL QID Provides 2240 kcals, 185 g of protein and 730 mL of free water Meets 100% estimated protein needs, 95% calorie needs  Add MVI with Minerals  NUTRITION DIAGNOSIS:   Inadequate oral intake related to acute illness, catabolic illness as evidenced by NPO status.  GOAL:   Patient will meet greater than or equal to 90% of their needs  MONITOR:   Vent status, Labs, Weight trends, TF tolerance, Skin  REASON FOR ASSESSMENT:   Consult, Ventilator Enteral/tube feeding initiation and management  ASSESSMENT:   64 yo male admitted with acute respiratory failure due to COVID-19 pneumonia requiring intubation. PMH includes DM, HTN, CAD, depression   RD working remotely.  12/30 Admit 01/02 Intubated  Noted plan to prone Patient is currently intubated on ventilator support, paralyzed on nimbex; sedated with fentanyl and versed drips MV: 13.8 L/min Temp (24hrs), Avg:101.3 F (38.5 C), Min:99.4 F (37.4 C), Max:102.6 F (39.2 C)  Propofol: NONE  Unable to obtain diet and weight history at this time. Based on weight encounters, pt weight has been trending down. Current weight 82.4 kg; weight of 93 kg in September. 12% wt loss.  Abd xray with OG tube in stomach  Labs: CBGs 114-158, sodium 147 (H), Creatinine wdl Meds: decadron, lasix, ss novolog, levemir   Diet Order:   Diet Order    None      EDUCATION NEEDS:   Not appropriate for education at this time  Skin:  Skin Assessment: Skin Integrity Issues: Skin Integrity Issues:: Other (Comment) Other: MASD: coccyx, scrotum  Last BM:  no documented BM  Height:   Ht Readings from Last 1 Encounters:  08/23/19 5\' 10"  (1.778 m)    Weight:   Wt Readings from Last 1 Encounters:  08/25/19 82.4 kg   BMI:  Body mass index is 26.07 kg/m.  Estimated Nutritional Needs:    Kcal:  2381 kcals  Protein:  125-165 g  Fluid:  >/= 2 L    Brilee Port MS, RDN, LDN, CNSC [phone] Pager  [phone] Weekend/On-Call Pager

## 2019-08-26 LAB — GLUCOSE, CAPILLARY
Glucose-Capillary: 228 mg/dL — ABNORMAL HIGH (ref 70–99)
Glucose-Capillary: 217 mg/dL — ABNORMAL HIGH (ref 70–99)
Glucose-Capillary: 163 mg/dL — ABNORMAL HIGH (ref 70–99)
Glucose-Capillary: 171 mg/dL — ABNORMAL HIGH (ref 70–99)
Glucose-Capillary: 266 mg/dL — ABNORMAL HIGH (ref 70–99)
Glucose-Capillary: 293 mg/dL — ABNORMAL HIGH (ref 70–99)
Glucose-Capillary: 315 mg/dL — ABNORMAL HIGH (ref 70–99)

## 2019-08-26 LAB — POCT I-STAT 7, (LYTES, BLD GAS, ICA,H+H)
Acid-Base Excess: 1 mmol/L (ref 0.0–2.0)
Bicarbonate: 27.7 mmol/L (ref 20.0–28.0)
Calcium, Ion: 1.19 mmol/L (ref 1.15–1.40)
HCT: 42 % (ref 39.0–52.0)
Hemoglobin: 14.3 g/dL (ref 13.0–17.0)
O2 Saturation: 91 %
Patient temperature: 101.1
Potassium: 4.2 mmol/L (ref 3.5–5.1)
Sodium: 147 mmol/L — ABNORMAL HIGH (ref 135–145)
TCO2: 29 mmol/L (ref 22–32)
pCO2 arterial: 53.1 mmHg — ABNORMAL HIGH (ref 32.0–48.0)
pH, Arterial: 7.332 — ABNORMAL LOW (ref 7.350–7.450)
pO2, Arterial: 71 mmHg — ABNORMAL LOW (ref 83.0–108.0)

## 2019-08-26 LAB — CBC
HCT: 45.6 % (ref 39.0–52.0)
Hemoglobin: 14.3 g/dL (ref 13.0–17.0)
MCH: 30.8 pg (ref 26.0–34.0)
MCHC: 31.4 g/dL (ref 30.0–36.0)
MCV: 98.3 fL (ref 80.0–100.0)
Platelets: 313 10*3/uL (ref 150–400)
RBC: 4.64 MIL/uL (ref 4.22–5.81)
RDW: 15 % (ref 11.5–15.5)
WBC: 5.4 10*3/uL (ref 4.0–10.5)
nRBC: 0 % (ref 0.0–0.2)

## 2019-08-26 LAB — COMPREHENSIVE METABOLIC PANEL
ALT: 56 U/L — ABNORMAL HIGH (ref 0–44)
AST: 114 U/L — ABNORMAL HIGH (ref 15–41)
Albumin: 2.6 g/dL — ABNORMAL LOW (ref 3.5–5.0)
Alkaline Phosphatase: 77 U/L (ref 38–126)
Anion gap: 11 (ref 5–15)
BUN: 55 mg/dL — ABNORMAL HIGH (ref 8–23)
CO2: 25 mmol/L (ref 22–32)
Calcium: 7.8 mg/dL — ABNORMAL LOW (ref 8.9–10.3)
Chloride: 108 mmol/L (ref 98–111)
Creatinine, Ser: 1.31 mg/dL — ABNORMAL HIGH (ref 0.61–1.24)
GFR calc Af Amer: >60 mL/min (ref >60)
GFR calc non Af Amer: 57 mL/min — ABNORMAL LOW (ref >60)
Glucose, Bld: 229 mg/dL — ABNORMAL HIGH (ref 70–99)
Potassium: 4.8 mmol/L (ref 3.5–5.1)
Sodium: 144 mmol/L (ref 135–145)
Total Bilirubin: 0.5 mg/dL (ref 0.3–1.2)
Total Protein: 5.6 g/dL — ABNORMAL LOW (ref 6.5–8.1)

## 2019-08-26 LAB — DIFFERENTIAL
Abs Immature Granulocytes: 0.03 10*3/uL (ref 0.00–0.07)
Basophils Absolute: 0 10*3/uL (ref 0.0–0.1)
Basophils Relative: 0 %
Eosinophils Absolute: 0.1 10*3/uL (ref 0.0–0.5)
Eosinophils Relative: 1 %
Immature Granulocytes: 1 %
Lymphocytes Relative: 25 %
Lymphs Abs: 1.4 10*3/uL (ref 0.7–4.0)
Monocytes Absolute: 0.3 10*3/uL (ref 0.1–1.0)
Monocytes Relative: 5 %
Neutro Abs: 3.8 10*3/uL (ref 1.7–7.7)
Neutrophils Relative %: 68 %

## 2019-08-26 LAB — MAGNESIUM
Magnesium: 2.3 mg/dL (ref 1.7–2.4)
Magnesium: 2.3 mg/dL (ref 1.7–2.4)

## 2019-08-26 LAB — PROCALCITONIN: Procalcitonin: 0.23 ng/mL

## 2019-08-26 LAB — PHOSPHORUS
Phosphorus: 3.2 mg/dL (ref 2.5–4.6)
Phosphorus: 3 mg/dL (ref 2.5–4.6)

## 2019-08-26 LAB — C-REACTIVE PROTEIN: CRP: 0.7 mg/dL (ref <1.0)

## 2019-08-26 LAB — D-DIMER, QUANTITATIVE: D-Dimer, Quant: 7.32 ug/mL-FEU — ABNORMAL HIGH (ref 0.00–0.50)

## 2019-08-26 MED ORDER — INSULIN ASPART 100 UNIT/ML ~~LOC~~ SOLN
4.0000 [IU] | SUBCUTANEOUS | Status: DC
  Administered 2019-08-26 – 2019-08-27 (×6): 4 [IU] via SUBCUTANEOUS

## 2019-08-26 MED ORDER — INSULIN ASPART 100 UNIT/ML ~~LOC~~ SOLN: 4.0000 [IU] | SUBCUTANEOUS | Status: DC

## 2019-08-26 MED ORDER — AMIODARONE IV BOLUS ONLY 150 MG/100ML
150.0000 mg | Freq: Once | INTRAVENOUS | Status: AC
  Administered 2019-08-26: 11:00:00 150 mg via INTRAVENOUS

## 2019-08-26 MED ORDER — VECURONIUM BROMIDE 10 MG IV SOLR: 8.0000 mg | INTRAVENOUS | Status: DC | PRN

## 2019-08-26 MED ORDER — VANCOMYCIN HCL 1750 MG/350ML IV SOLN
1750.0000 mg | Freq: Once | INTRAVENOUS | Status: AC
  Administered 2019-08-26: 14:00:00 1750 mg via INTRAVENOUS
  Filled 2019-08-26: qty 350

## 2019-08-26 MED ORDER — VANCOMYCIN HCL 1750 MG/350ML IV SOLN
1750.0000 mg | INTRAVENOUS | Status: DC
  Administered 2019-08-27 – 2019-08-28 (×2): 1750 mg via INTRAVENOUS
  Filled 2019-08-26 (×2): qty 350

## 2019-08-26 MED ORDER — SODIUM CHLORIDE 0.9 % IV SOLN
2.0000 g | Freq: Three times a day (TID) | INTRAVENOUS | Status: AC
  Administered 2019-08-26 – 2019-08-30 (×14): 2 g via INTRAVENOUS
  Filled 2019-08-26 (×14): qty 2

## 2019-08-26 NOTE — Progress Notes (Signed)
PT Cancellation Note  Patient Details Name: PARKS CZAJKOWSKI MRN: [id number] DOB: [birth year]   Cancelled Treatment:    Reason Eval/Treat Not Completed: Medical issues which prohibited therapy , vented and sedated. Will follow up in next few days for appropriateness of PT  [name] 08/26/2019, 11:51 AM  Blanchard Kelch PT Acute Rehabilitation Services Pager [phone] Office [phone]

## 2019-08-26 NOTE — Progress Notes (Signed)
Pt supined without any complications.   

## 2019-08-26 NOTE — Progress Notes (Signed)
Spoke with patient's wife and provided full update/answered all questions.  

## 2019-08-26 NOTE — Progress Notes (Signed)
Pharmacy Antibiotic Note  [name] [name] is a 64 y.o. male admitted on 2019-09-14 with sepsis.  Pharmacy has been consulted for Cefepime and vancomycin dosing given persistent fever. Cultures remain pending. PCT 0.23.    Plan: -Start Cefepime 2 gm IV Q 8 hours  -Vancomycin 1750 mg IV Q 24 hrs. Goal AUC 400-550. Expected AUC: 485 SCr used: 1.3 -Will plan to de-escalate based on culture data -Monitor CBC, renal fx and clinical progress  -F/u MRSA PCR    Height: 5\' 10"  (177.8 cm) Weight: 183 lb 10.3 oz (83.3 kg) IBW/kg (Calculated) : 73  Temp (24hrs), Avg:101.5 F (38.6 C), Min:100 F (37.8 C), Max:102.6 F (39.2 C)  Recent Labs  Lab 09-14-19 1410 09-14-19 1604 08/22/19 0315 08/23/19 0530 08/24/19 0258 08/25/19 0456 08/26/19 0400  WBC  --   --  6.4 6.4 9.8 7.3 5.4  CREATININE  --   --  0.84 0.70 0.86 1.10 1.31*  LATICACIDVEN 2.9* 2.9*  --   --   --   --   --     Estimated Creatinine Clearance: 58.8 mL/min (A) (by C-G formula based on SCr of 1.31 mg/dL (H)).    Allergies  Allergen Reactions  . Hydrocodone-Acetaminophen Swelling    Swelling of hands and feet followed by skin peeling off of hands and feet  . Hydrochlorothiazide Itching    Antimicrobials this admission: Vanc 1/5 >>  Cefepim 1/5 >>   Dose adjustments this admission:  Microbiology results: 1/4 BCx: ngtd   Thank you for allowing pharmacy to be a part of this patient's care.  10/24/19, PharmD., BCPS Clinical Pharmacist Clinical phone for 08/25/18 until 5pm: [phone]

## 2019-08-26 NOTE — Progress Notes (Signed)
NAME:  [name] [name], MRN:  [id number], DOB:  [birth year], LOS: 42 ADMISSION DATE:  09-09-2019, CONSULTATION DATE: 08/21/2019 REFERRING MD: Dr. Waldron Labs, CHIEF COMPLAINT: Hypoxemic respiratory failure  Brief History   64 year old man with CAD, hypertension, diabetes, OSA.  Admitted with COVID-19 pneumonia and hypoxemic respiratory failure 12/30.  Past Medical History   has a past medical history of Chest pain, Coronary artery disease, Diabetes mellitus without complication ([place]), Dyspnea, Hyperlipidemia, Hypertension, Right bundle branch block, and Sleep apnea.   Significant Hospital Events   Admit 12/30 To ICU on 1.00 NRB, HHFNC.  Decadron started.  Remdesivir started,Tocilizumab x1, 12/30  12/31 empiric abx started 1/2 PCCM consulted. Intubated.  1/3 requiring neuromuscular blockade, PEEP and FiO2 improving.  Completed remdesivir 1/4: Started on prone positioning protocol given PDF less than 150.  Recultured for fever spike developed narrow complex tachycardia with a rate in the 160s to 170s, was given adenosine which identified underlying atrial flutter, loaded with amiodarone and infusion initiated 1/5: Ongoing fever Consults:  PCCM 12/31  Procedures:  ET tube 1/2 >>  Right IJ CVC 1/2 >>   Significant Diagnostic Tests:  Chest x-ray 12/30 >> scattered patchy bilateral pulmonary infiltrates  Micro Data:  SARS CoV 2 12/27 >> positive MRSA screen 12/31 >> negative Blood 12/30 >> Respiratory culture 1/4>>> Urine culture 1/5>> Antimicrobials:  Remdesivir 12/30 >> 1/3 Tocilizumab x1, 12/30 Azithromycin 12/31 >> 1/4 Ceftriaxone 12/31 >> 1/4  Interim history/subjective:    Objective   Blood pressure (Abnormal) 104/52, pulse (Abnormal) 162, temperature (Abnormal) 102 F (38.9 C), temperature source Rectal, resp. rate (Abnormal) 30, height 5\' 10"  (1.778 m), weight 83.3 kg, SpO2 99 %. CVP:  [5 mmHg-8 mmHg] 8 mmHg  Vent Mode: PRVC FiO2 (%):  [60 %-100 %] 60 % Set Rate:  [30  bmp] 30 bmp Vt Set:  [500 mL] 500 mL PEEP:  [10 cmH20] 10 cmH20 Plateau Pressure:  [22 cmH20-24 cmH20] 24 cmH20   Intake/Output Summary (Last 24 hours) at 08/26/2019 0858 Last data filed at 08/26/2019 0700 Gross per 24 hour  Intake 2914.78 ml  Output 2020 ml  Net 894.78 ml   Filed Weights   09-09-19 2134 08/25/19 0300 08/26/19 0343  Weight: 88 kg 82.4 kg 83.3 kg    Examination: General: This is a 64 year old white male currently sedated and paralyzed on the mechanical ventilator HEENT normocephalic atraumatic orally intubated no JVD Pulmonary: Coarse scattered rhonchi equal chest rise bilaterally Ventilator assessment: FiO2:60 PEEP:10 Plateau pressure:24 Driving pressure: 14 Cardiac: Remains in atrial fibrillation, does appear to be responding to second dose of amiodarone bolus Abdomen soft nontender no organomegaly Extremities are warm and dry brisk cap refill trace edema Neuro heavily sedated GU clear yellow  Resolved Hospital Problem list   Treated empirically for CAP antibiotics discontinued 1/5  Assessment & Plan:   Acute hypoxemic respiratory failure due to COVID-19 pneumonia/pneumonitis Ruling out HCAP 1/5 Portable chest x-ray personally reviewed: This was from film on 1/4.  Showing worsening aeration particularly on the right greater than left.  With new airspace disease in the right base ongoing diffuse infiltrates -Remdesivir has been completed -D-dimer remains elevated  -P/F ratio 180 in the prone position  plan Continuing low tidal volume ventilation at 6 mL/kg predicted body weight Continue to adjust PEEP/FiO2 for target PaO2 55 to 65%  Plateau pressure goal less than 30 driving pressure goal less than 15  Continuing proning protocol until P/F ratio greater than 150 in the supine position, his repeat ratio  is still 118 so we will continue Continuing IV diuresis as long as BUN/creatinine will tolerate, he is still 3 L positive, however holding today as CVP is 8  and creatinine bump with diuretics on 1/4 RASS goal -4, continue high-dose fentanyl and Versed.  We will discontinue Nimbex infusion today and changed to as needed.  We will assess need for additional oxycodone and clonazepam Follow-up respiratory culture and procalcitonin as mentioned below VAP bundle Day #7 of 10 Decadron  A.m. chest x-ray 1/6  Fever on 1/4: Cultures obtained.  No spike in white blood cell count, spiked again on 1/5 Plan Check procalcitonin Follow-up cultures Check CBC with differential Send urine culture for completeness, we sent blood in sputum yesterday If procalcitonin elevated we will add nosocomial coverage  Fluid and electrolyte imbalance: Hypernatremia, improved since adding free water on 1/4 Plan Continue current free water replacement at 200 mL every 4 hours Continue daily chemistries  Mild AKI, I suspect secondary to diuretics Plan Hold Lasix today Repeat a.m. chemistry and keep euvolemic at this point  DM 2 with hyperglycemia Plan Continue Levemir 28 units daily  Continue sliding scale insulin  We will add basal dosing of aspart insulin every 4 hours  Transaminitis -Remdesivir's discontinued on 1/3, improving Plan We will repeat LFTs again on 1/7  Hx HTN Plan Holding antihypertensives with sedation  New onset atrial fib/a flutter after first proning attempt on 1/4 Plan Received a second Amio bolus and Amiodarone infusion  CAD Plan Continue aspirin and statin  Toxic metabolic encephalopathy, need for sedation to facilitate mechanical ventilation and h/o depression Plan Continue baseline Lexapro and Wellbutrin  Continuing sedation protocol as mentioned above in pulmonary section    Best practice:  Diet: NPO tubefeeds 1/4 Pain/Anxiety/Delirium protocol (if indicated): PAD versed, fentanyl VAP protocol (if indicated): N/A DVT prophylaxis:enoxaparin GI prophylaxis: Pepcid Glucose control: Sliding scale insulin, Levemir Mobility:  Bedrest Code Status: Full Family Communication: Discussed with wife on 1/3 Disposition: ICU; critically ill. Headed the right direction.   My critical care time is 36 minutes  Simonne Martinet ACNP-BC [facility] Pulmonary/Critical Care Pager # [phone] OR # [phone] if no answer

## 2019-08-26 NOTE — Progress Notes (Signed)
Wife updated via phone  Simonne Martinet ACNP-BC [facility] Pulmonary/Critical Care Pager # [phone] OR # [phone] if no answer

## 2019-08-26 NOTE — Progress Notes (Signed)
OT Cancellation Note  Patient Details Name: DEVRIN MONFORTE MRN: [id number] DOB: 11-Apr-1956   Cancelled Treatment:    Reason Eval/Treat Not Completed: Patient not medically ready(Intubated and sedated. Will return as pt medically ready.)  Cristin Szatkowski M Tavares Levinson Awa Bachicha MSOT, OTR/L Acute Rehab Pager: [phone] Office: [phone] 08/26/2019, 1:10 PM

## 2019-08-27 LAB — CBC
HCT: 41.8 % (ref 39.0–52.0)
HCT: 44.2 % (ref 39.0–52.0)
Hemoglobin: 12.9 g/dL — ABNORMAL LOW (ref 13.0–17.0)
Hemoglobin: 13.6 g/dL (ref 13.0–17.0)
MCH: 30.6 pg (ref 26.0–34.0)
MCH: 30.5 pg (ref 26.0–34.0)
MCHC: 30.9 g/dL (ref 30.0–36.0)
MCHC: 30.8 g/dL (ref 30.0–36.0)
MCV: 99.1 fL (ref 80.0–100.0)
MCV: 99.1 fL (ref 80.0–100.0)
Platelets: 301 10*3/uL (ref 150–400)
Platelets: 271 10*3/uL (ref 150–400)
RBC: 4.22 MIL/uL (ref 4.22–5.81)
RBC: 4.46 MIL/uL (ref 4.22–5.81)
RDW: 14.9 % (ref 11.5–15.5)
RDW: 14.8 % (ref 11.5–15.5)
WBC: 5 10*3/uL (ref 4.0–10.5)
WBC: 6.3 10*3/uL (ref 4.0–10.5)
nRBC: 0 % (ref 0.0–0.2)
nRBC: 0 % (ref 0.0–0.2)

## 2019-08-27 LAB — POCT I-STAT 7, (LYTES, BLD GAS, ICA,H+H)
Acid-Base Excess: 5 mmol/L — ABNORMAL HIGH (ref 0.0–2.0)
Bicarbonate: 30.9 mmol/L — ABNORMAL HIGH (ref 20.0–28.0)
Calcium, Ion: 1.15 mmol/L (ref 1.15–1.40)
HCT: 40 % (ref 39.0–52.0)
Hemoglobin: 13.6 g/dL (ref 13.0–17.0)
O2 Saturation: 91 %
Patient temperature: 99.6
Potassium: 4.4 mmol/L (ref 3.5–5.1)
Sodium: 149 mmol/L — ABNORMAL HIGH (ref 135–145)
TCO2: 32 mmol/L (ref 22–32)
pCO2 arterial: 50 mmHg — ABNORMAL HIGH (ref 32.0–48.0)
pH, Arterial: 7.401 (ref 7.350–7.450)
pO2, Arterial: 63 mmHg — ABNORMAL LOW (ref 83.0–108.0)

## 2019-08-27 LAB — COMPREHENSIVE METABOLIC PANEL
ALT: 45 U/L — ABNORMAL HIGH (ref 0–44)
AST: 90 U/L — ABNORMAL HIGH (ref 15–41)
Albumin: 2.4 g/dL — ABNORMAL LOW (ref 3.5–5.0)
Alkaline Phosphatase: 67 U/L (ref 38–126)
Anion gap: 6 (ref 5–15)
BUN: 59 mg/dL — ABNORMAL HIGH (ref 8–23)
CO2: 30 mmol/L (ref 22–32)
Calcium: 8 mg/dL — ABNORMAL LOW (ref 8.9–10.3)
Chloride: 110 mmol/L (ref 98–111)
Creatinine, Ser: 1.07 mg/dL (ref 0.61–1.24)
GFR calc Af Amer: >60 mL/min (ref >60)
GFR calc non Af Amer: >60 mL/min (ref >60)
Glucose, Bld: 273 mg/dL — ABNORMAL HIGH (ref 70–99)
Potassium: 4.4 mmol/L (ref 3.5–5.1)
Sodium: 146 mmol/L — ABNORMAL HIGH (ref 135–145)
Total Bilirubin: 0.5 mg/dL (ref 0.3–1.2)
Total Protein: 5.4 g/dL — ABNORMAL LOW (ref 6.5–8.1)

## 2019-08-27 LAB — GLUCOSE, CAPILLARY
Glucose-Capillary: 261 mg/dL — ABNORMAL HIGH (ref 70–99)
Glucose-Capillary: 223 mg/dL — ABNORMAL HIGH (ref 70–99)
Glucose-Capillary: 241 mg/dL — ABNORMAL HIGH (ref 70–99)
Glucose-Capillary: 292 mg/dL — ABNORMAL HIGH (ref 70–99)
Glucose-Capillary: 285 mg/dL — ABNORMAL HIGH (ref 70–99)

## 2019-08-27 LAB — URINE CULTURE: Culture: NO GROWTH

## 2019-08-27 LAB — PROCALCITONIN: Procalcitonin: 0.1 ng/mL

## 2019-08-27 MED ORDER — CLONAZEPAM 1 MG PO TABS
1.0000 mg | ORAL_TABLET | Freq: Two times a day (BID) | ORAL | Status: DC
  Administered 2019-08-27 – 2019-08-28 (×4): 1 mg
  Filled 2019-08-27 (×4): qty 1

## 2019-08-27 MED ORDER — POTASSIUM CHLORIDE 20 MEQ/15ML (10%) PO SOLN
40.0000 meq | Freq: Once | ORAL | Status: AC
  Administered 2019-08-27: 10:00:00 40 meq
  Filled 2019-08-27: qty 30

## 2019-08-27 MED ORDER — INSULIN ASPART 100 UNIT/ML ~~LOC~~ SOLN
8.0000 [IU] | SUBCUTANEOUS | Status: DC
  Administered 2019-08-27 – 2019-09-01 (×32): 8 [IU] via SUBCUTANEOUS

## 2019-08-27 MED ORDER — MELATONIN 3 MG PO TABS
3.0000 mg | ORAL_TABLET | Freq: Every day | ORAL | Status: DC
  Administered 2019-08-27 – 2019-09-11 (×16): 3 mg
  Filled 2019-08-27 (×16): qty 1

## 2019-08-27 MED ORDER — FUROSEMIDE 10 MG/ML IJ SOLN
80.0000 mg | Freq: Once | INTRAMUSCULAR | Status: AC
  Administered 2019-08-27: 80 mg via INTRAVENOUS
  Filled 2019-08-27: qty 8

## 2019-08-27 NOTE — Progress Notes (Signed)
NAME:  [name] [name], MRN:  [id number], DOB:  13-May-1956, LOS: 7 ADMISSION DATE:  08/22/2019, CONSULTATION DATE: 08/21/2019 REFERRING MD: Dr. Waldron Labs, CHIEF COMPLAINT: Hypoxemic respiratory failure  Brief History   64 year old man with CAD, hypertension, diabetes, OSA.  Admitted with COVID-19 pneumonia and hypoxemic respiratory failure 12/30.  Past Medical History   has a past medical history of Chest pain, Coronary artery disease, Diabetes mellitus without complication ([place]), Dyspnea, Hyperlipidemia, Hypertension, Right bundle branch block, and Sleep apnea.   Significant Hospital Events   Admit 12/30 To ICU on 1.00 NRB, HHFNC.  Decadron started.  Remdesivir started,Tocilizumab x1, 12/30  12/31 empiric abx started 1/2 PCCM consulted. Intubated.  1/3 requiring neuromuscular blockade, PEEP and FiO2 improving.  Completed remdesivir 1/4: Started on prone positioning protocol given PDF less than 150.  Recultured for fever spike developed narrow complex tachycardia with a rate in the 160s to 170s, was given adenosine which identified underlying atrial flutter, loaded with amiodarone and infusion initiated 1/5: Ongoing fever, empiric nosocomial coverage initiated, in spite of this making improvement and FiO2 and PEEP requirements 1/6: Still spiking fevers, did have hemoglobin drop of almost 2 g, checking lower extremity ultrasounds to rule out venous thrombosis Consults:  PCCM 12/31  Procedures:  ET tube 1/2 >>  Right IJ CVC 1/2 >>   Significant Diagnostic Tests:  Lower extremity ultrasound 1/6 Micro Data:  SARS CoV 2 12/27 >> positive MRSA screen 12/31 >> negative Blood 12/30 >> Respiratory culture 1/4>>> Urine culture 1/5>> Antimicrobials:  Remdesivir 12/30 >> 1/3 Tocilizumab x1, 12/30 Azithromycin 12/31 >> 1/4 Ceftriaxone 12/31 >> 1/4 Cefepime 1/5>>> Vancomycin 1/5>>> Interim history/subjective:  Tolerating supine position well Objective   Blood pressure 129/65, pulse 97,  temperature (Abnormal) 101.8 F (38.8 C), resp. rate (Abnormal) 30, height 5\' 10"  (1.778 m), weight 88.4 kg, SpO2 100 %. CVP:  [8 mmHg-9 mmHg] 9 mmHg  Vent Mode: PRVC FiO2 (%):  [60 %] 60 % Set Rate:  [30 bmp] 30 bmp Vt Set:  [500 mL] 500 mL PEEP:  [10 cmH20] 10 cmH20 Plateau Pressure:  [20 cmH20-22 cmH20] 20 cmH20   Intake/Output Summary (Last 24 hours) at 08/27/2019 0914 Last data filed at 08/27/2019 0800 Gross per 24 hour  Intake 4090.14 ml  Output 2227 ml  Net 1863.14 ml   Filed Weights   08/25/19 0300 08/26/19 0343 08/27/19 0412  Weight: 82.4 kg 83.3 kg 88.4 kg    Examination: General 64 year old white male currently sedated on mechanical ventilation HEENT orally intubated no JVD pupils equal reactive brisk capillary refill Pulmonary: Bilateral equal chest rise, diminished throughout Ventilator assessment: FiO2:50 PEEP:10 Plateau pressure:24 Driving pressure: 14 Cardiac regular rate and rhythm currently sinus rhythm/sinus tach Abdomen soft nontender tolerating tube feeds Extremities dependent edema brisk capillary refill warm to touch GU clear yellow  Resolved Hospital Problem list   Treated empirically for CAP antibiotics discontinued 1/5  Assessment & Plan:   Acute hypoxemic respiratory failure due to COVID-19 pneumonia/pneumonitis Ruling out HCAP 1/5 Continues to be tolerating current ventilator settings  plan Continue low tidal volume ventilation at 6 mL/kg predicted body weight  Continue to adjust PEEP/FiO2 for target PaO2 55 to 65%  Adjusting PEEP in tidal volume to ensure plateau pressure less than 30 and driving pressure less than 15  Discontinuing prone positioning protocol as hemodynamically he continues to demonstrate poor response to this We backed off diuresis yesterday due to rising creatinine, his creatinine is normalized so we will resume diuresis again today and  reassess this daily as his fluid balance is +4.9 L  Day #2 empiric cefepime and  vancomycin for potential HCAP  Repeat chest x-ray in a.m.  Follow-up arterial blood gas Continue PAD protocol with RASS goal of -2, have added clonazepam with goal to discontinue Versed altogether on 1/7  Discontinue Nimbex  Continue fentanyl infusion for now VAP bundle  A.m. chest x-ray  Repeat ABG in supine position  Day #8 of 10 Decadron   Fever on 1/4: Cultures obtained.  No spike in white blood cell count, spiked again on 1/5 fevers persist, no culture data to explain this as of yet Plan Antibiotics as above  Fluid and electrolyte imbalance: Hypernatremia, improved since added free water on 1/4 Plan Increase free water to 250 every 4 hours Continue daily chemistry  Mild AKI, I suspect secondary to diuretics, as improved with holding Lasix, now normalized Plan Repeating Lasix again today A.m. chemistry Renally adjust medications as indicated  DM 2 with hyperglycemia Plan Continue Levemir at 28 units daily  Adjusting basal NovoLog from 4 to 8 units every 4 hours  Continue sliding scale  Hopefully when he comes off Decadron his glycemic control will be improved   Transaminitis -Remdesivir's discontinued on 1/3, improving Plan Repeat LFTs on 1/7  Hx HTN Plan Holding antihypertensives with sedation  New onset atrial fib/a flutter after first proning attempt on 1/4 Plan Continue amiodarone infusion with plan to discontinue this altogether on 1/5  CAD Plan Continue aspirin statin  Toxic metabolic encephalopathy, need for sedation to facilitate mechanical ventilation and h/o depression Plan Continue Lexapro and Wellbutrin  Sedation approach mentioned above in respiratory failure plan     Best practice:  Diet: NPO tubefeeds 1/4 Pain/Anxiety/Delirium protocol (if indicated): PAD versed, fentanyl VAP protocol (if indicated): N/A DVT prophylaxis:enoxaparin GI prophylaxis: Pepcid Glucose control: Sliding scale insulin, Levemir Mobility: Bedrest Code Status:  Full Family Communication: Discussed with wife on 1/3 Disposition: ICU; critically ill. Headed the right direction.   Critical care x33 minutes  Simonne Martinet ACNP-BC [facility] Pulmonary/Critical Care Pager # [phone] OR # [phone] if no answer

## 2019-08-28 ENCOUNTER — Inpatient Hospital Stay (HOSPITAL_COMMUNITY): Payer: BC Managed Care – PPO

## 2019-08-28 DIAGNOSIS — G9341 Metabolic encephalopathy: Secondary | ICD-10-CM

## 2019-08-28 DIAGNOSIS — R509 Fever, unspecified: Secondary | ICD-10-CM

## 2019-08-28 LAB — POCT I-STAT 7, (LYTES, BLD GAS, ICA,H+H)
Acid-Base Excess: 8 mmol/L — ABNORMAL HIGH (ref 0.0–2.0)
Bicarbonate: 32.5 mmol/L — ABNORMAL HIGH (ref 20.0–28.0)
Calcium, Ion: 1.2 mmol/L (ref 1.15–1.40)
HCT: 35 % — ABNORMAL LOW (ref 39.0–52.0)
Hemoglobin: 11.9 g/dL — ABNORMAL LOW (ref 13.0–17.0)
O2 Saturation: 83 %
Patient temperature: 99.2
Potassium: 4 mmol/L (ref 3.5–5.1)
Sodium: 151 mmol/L — ABNORMAL HIGH (ref 135–145)
TCO2: 34 mmol/L — ABNORMAL HIGH (ref 22–32)
pCO2 arterial: 44.4 mmHg (ref 32.0–48.0)
pH, Arterial: 7.474 — ABNORMAL HIGH (ref 7.350–7.450)
pO2, Arterial: 45 mmHg — ABNORMAL LOW (ref 83.0–108.0)

## 2019-08-28 LAB — PROCALCITONIN: Procalcitonin: <0.1 ng/mL

## 2019-08-28 LAB — COMPREHENSIVE METABOLIC PANEL
ALT: 42 U/L (ref 0–44)
AST: 72 U/L — ABNORMAL HIGH (ref 15–41)
Albumin: 2.3 g/dL — ABNORMAL LOW (ref 3.5–5.0)
Alkaline Phosphatase: 56 U/L (ref 38–126)
Anion gap: 9 (ref 5–15)
BUN: 63 mg/dL — ABNORMAL HIGH (ref 8–23)
CO2: 27 mmol/L (ref 22–32)
Calcium: 8 mg/dL — ABNORMAL LOW (ref 8.9–10.3)
Chloride: 114 mmol/L — ABNORMAL HIGH (ref 98–111)
Creatinine, Ser: 0.98 mg/dL (ref 0.61–1.24)
GFR calc Af Amer: >60 mL/min (ref >60)
GFR calc non Af Amer: >60 mL/min (ref >60)
Glucose, Bld: 261 mg/dL — ABNORMAL HIGH (ref 70–99)
Potassium: 4.1 mmol/L (ref 3.5–5.1)
Sodium: 150 mmol/L — ABNORMAL HIGH (ref 135–145)
Total Bilirubin: 0.5 mg/dL (ref 0.3–1.2)
Total Protein: 5.3 g/dL — ABNORMAL LOW (ref 6.5–8.1)

## 2019-08-28 LAB — CBC
HCT: 39.1 % (ref 39.0–52.0)
Hemoglobin: 12.1 g/dL — ABNORMAL LOW (ref 13.0–17.0)
MCH: 30.9 pg (ref 26.0–34.0)
MCHC: 30.9 g/dL (ref 30.0–36.0)
MCV: 99.7 fL (ref 80.0–100.0)
Platelets: 274 10*3/uL (ref 150–400)
RBC: 3.92 MIL/uL — ABNORMAL LOW (ref 4.22–5.81)
RDW: 14.9 % (ref 11.5–15.5)
WBC: 7.3 10*3/uL (ref 4.0–10.5)
nRBC: 0 % (ref 0.0–0.2)

## 2019-08-28 LAB — GLUCOSE, CAPILLARY
Glucose-Capillary: 330 mg/dL — ABNORMAL HIGH (ref 70–99)
Glucose-Capillary: 267 mg/dL — ABNORMAL HIGH (ref 70–99)
Glucose-Capillary: 171 mg/dL — ABNORMAL HIGH (ref 70–99)
Glucose-Capillary: 243 mg/dL — ABNORMAL HIGH (ref 70–99)
Glucose-Capillary: 293 mg/dL — ABNORMAL HIGH (ref 70–99)
Glucose-Capillary: 280 mg/dL — ABNORMAL HIGH (ref 70–99)

## 2019-08-28 MED ORDER — FENTANYL 2500MCG IN NS 250ML (10MCG/ML) PREMIX INFUSION
0.0000 ug/h | INTRAVENOUS | Status: DC
  Administered 2019-08-29: 250 ug/h via INTRAVENOUS
  Filled 2019-08-28: qty 250

## 2019-08-28 MED ORDER — VANCOMYCIN HCL 1250 MG/250ML IV SOLN
1250.0000 mg | Freq: Two times a day (BID) | INTRAVENOUS | Status: AC
  Administered 2019-08-29 – 2019-08-30 (×4): 1250 mg via INTRAVENOUS
  Filled 2019-08-28 (×4): qty 250

## 2019-08-28 MED ORDER — FENTANYL CITRATE (PF) 100 MCG/2ML IJ SOLN
50.0000 ug | INTRAMUSCULAR | Status: DC | PRN
  Administered 2019-08-28 – 2019-08-29 (×3): 100 ug via INTRAVENOUS
  Filled 2019-08-28 (×2): qty 2

## 2019-08-28 MED ORDER — DEXMEDETOMIDINE HCL IN NACL 400 MCG/100ML IV SOLN
0.0000 ug/kg/h | INTRAVENOUS | Status: DC
  Administered 2019-08-28: 20:00:00 0.8 ug/kg/h via INTRAVENOUS
  Administered 2019-08-28: 12:00:00 0.4 ug/kg/h via INTRAVENOUS
  Administered 2019-08-29 (×2): 0.8 ug/kg/h via INTRAVENOUS
  Filled 2019-08-28 (×4): qty 100

## 2019-08-28 MED ORDER — FENTANYL CITRATE (PF) 100 MCG/2ML IJ SOLN: 50.0000 ug | INTRAMUSCULAR | Status: DC | PRN

## 2019-08-28 NOTE — Progress Notes (Signed)
Occupational Therapy Treatment Patient Details Name: [name] [name] MRN: [id number] DOB: 1956/02/18 Today's Date: 08/28/2019    History of present illness 64 y.o. male with a past medical history significant for CAD status post bypass grafting in 1996 and SVG was stented in 2006, hyperlipidemia, hypertension, right bundle branch block, sleep apnea on CPAP machine who presents to the ED due to gradual onset of progressively worsening COVID-like symptoms.   OT comments  Pt has had a medical decline since previous OT session, now requiring ventilator support. Worked on PROM to upper extremities this date to prevent joint contractures, promote mobility, and increase blood flow. RN agreeable to weaning sedation during session with no change noted in pt's level of consciousness/awareness despite verbal and tactile cues. Pt placed supine in bed to scoot up, requiring total assist. SpO2 noted to drop to 87% while flat with SpO2 increasing back to 90-91% once semi-reclined again. Assessed pt's ability to tolerate chair position in bed. Vitals remained stable with slight drop in blood pressure (see below for details). OT will continue to follow acutely.   Vent Settings: PRVC, 40%FiO2, PEEP 8 BP start: 148/6mmHg. BP HOB 55 degrees: 139/62mmHg BP HOB 65 degrees: 136/32mmHg     Follow Up Recommendations  Other (comment)(TBA as pt progresses)    Equipment Recommendations  3 in 1 bedside commode    Recommendations for Other Services      Precautions / Restrictions Precautions Precautions: Fall;Other (comment) Precaution Comments: Ventilator Settings: PRVC, 40%FiO2, PEEP 8       Mobility Bed Mobility Overal bed mobility: Needs Assistance Bed Mobility: (scooting up in bed) Rolling: Total assist;+2 for physical assistance;+2 for safety/equipment            Transfers                      Balance                                           ADL either performed or  assessed with clinical judgement   ADL                                               Vision       Perception     Praxis      Cognition Arousal/Alertness: (Pt on ventilator)                                     General Comments: Pt on a ventilator. RN agreeable to weaning sedation during session. No change in pt's level of consciousness        Exercises Exercises: General Upper Extremity General Exercises - Upper Extremity Shoulder Flexion: PROM;10 reps(to 90 degrees only on LUE and ~45 degrees RUE due to vent) Shoulder ABduction: PROM;10 reps(semi reclined in bed) Shoulder ADduction: PROM;10 reps(semi reclined in bed) Elbow Flexion: PROM;10 reps(semi reclined in bed) Elbow Extension: 10 reps;PROM(semi reclined in bed) Wrist Flexion: PROM;10 reps(semi reclined in bed) Wrist Extension: PROM;10 reps(semi reclined in bed) Digit Composite Flexion: PROM(semi reclined in bed) Composite Extension: PROM(semi reclined in bed)   Shoulder Instructions       General Comments Despite weaning  of sedation pt does not respond to verbal or tactile stimuli. Provided PROM to UE to prevent joint contracture, promote mobility, and increase blood flow. Worked on pt's tolerance to sitting up in chair position in bed. Pt able to tolerate HOB 65 degrees with no change in vitals.    Pertinent Vitals/ Pain       Pain Assessment: Faces Faces Pain Scale: No hurt  Home Living                                          Prior Functioning/Environment              Frequency           Progress Toward Goals  OT Goals(current goals can now be found in the care plan section)  Progress towards OT goals: Not progressing toward goals - comment(Pt placed on ventilator since last OT treatment)  ADL Goals Pt Will Perform Grooming: with set-up;sitting Pt Will Perform Upper Body Bathing: with set-up;sitting Pt Will Perform Lower Body Bathing:  with min assist;bed level Pt Will Transfer to Toilet: with min assist;bedside commode;stand pivot transfer Additional ADL Goal #1: Participate in ADL task with SpO2 remaining above 88 using pursed lip breathing techniques  Plan Discharge plan remains appropriate    Co-evaluation    PT/OT/SLP Co-Evaluation/Treatment: Yes Reason for Co-Treatment: Complexity of the patient's impairments (multi-system involvement);Necessary to address cognition/behavior during functional activity   OT goals addressed during session: Strengthening/ROM      AM-PAC OT "6 Clicks" Daily Activity     Outcome Measure   Help from another person eating meals?: Total Help from another person taking care of personal grooming?: Total Help from another person toileting, which includes using toliet, bedpan, or urinal?: Total Help from another person bathing (including washing, rinsing, drying)?: Total Help from another person to put on and taking off regular upper body clothing?: Total Help from another person to put on and taking off regular lower body clothing?: Total 6 Click Score: 6    End of Session Equipment Utilized During Treatment: Other (comment)(Ventilator)      Activity Tolerance Treatment limited secondary to medical complications (Comment)   Patient Left in bed(in chair position)   Nurse Communication Mobility status        Time: 3299-2426 OT Time Calculation (min): 33 min  Charges: OT General Charges $OT Visit: 1 Visit OT Treatments $Therapeutic Activity: 8-22 mins  Peterson Ao OTR/L [phone]    Peterson Ao 08/28/2019, 3:59 PM

## 2019-08-28 NOTE — Progress Notes (Signed)
Pharmacy Antibiotic Note  [name] [name] is a 64 y.o. male admitted on 08/15/2019 with sepsis.  Pharmacy has been consulted for Cefepime and vancomycin dosing given persistent fever. Blood Cultures remain pending but negative so far. No respiratory cultures drawn. PCT 0.23.    Plan: -Continue Cefepime 2 gm IV Q 8 hours  -Increase Vancomycin to 1250 mg IV Q 24 hrs. Goal AUC 400-550. Expected AUC: 475 SCr used: 0.98 -Will plan to de-escalate based on culture data -Monitor CBC, renal fx and clinical progress  -F/u MRSA PCR    Height: 5\' 10"  (177.8 cm) Weight: 195 lb 5.2 oz (88.6 kg) IBW/kg (Calculated) : 73  Temp (24hrs), Avg:101 F (38.3 C), Min:99.5 F (37.5 C), Max:101.8 F (38.8 C)  Recent Labs  Lab 08/24/19 0258 08/25/19 0456 08/26/19 0400 08/27/19 0422 08/27/19 1340 08/28/19 0350  WBC 9.8 7.3 5.4 5.0 6.3 7.3  CREATININE 0.86 1.10 1.31* 1.07  --  0.98    Estimated Creatinine Clearance: 85.3 mL/min (by C-G formula based on SCr of 0.98 mg/dL).    Allergies  Allergen Reactions  . Hydrocodone-Acetaminophen Swelling    Swelling of hands and feet followed by skin peeling off of hands and feet  . Hydrochlorothiazide Itching    Antimicrobials this admission: Vanc 1/5 >>  Cefepim 1/5 >>   Dose adjustments this admission:  Microbiology results: 1/4 BCx: ngtd   Thank you for allowing pharmacy to be a part of this patient's care.  10/26/19, PharmD., BCPS Clinical Pharmacist Clinical phone for 08/27/18 until 5pm: [phone]

## 2019-08-28 NOTE — Progress Notes (Signed)
[facility] PCCM Evening Rounds  Worsening oxygenation in face of severe vent dyssyncyhrony, FiO2 up to 70%.  I think this is mostly dyssynchrony related.  Add back fentanyl overnight.  Wean FiO2  Heber [place], MD  PCCM Pager: [phone] Cell: [phone] If no response, call [phone]

## 2019-08-28 NOTE — Evaluation (Signed)
Physical Therapy Evaluation Patient Details Name: [name] [name] MRN: [id number] DOB: 1955-10-30 Today's Date: 08/28/2019   History of Present Illness  64 y.o. male with a past medical history significant for CAD status post bypass grafting in 1996 and SVG was stented in 2006, hyperlipidemia, hypertension, right bundle branch block, sleep apnea on CPAP machine who presents to the ED due to gradual onset of progressively worsening COVID-like symptoms.Dx with acute hypoxic respiratory failure due to COVID 19 PNA, ruling out HCAP, mild AKI, hypernatremia, transaminitis, toxic metabolic encepholopathy.  Intubated 08/23/19.   Clinical Impression  Pt is sedated and ventilated.  Sedation minimally lifted, but not enough to produce a response.  4 extremity ROM completed and pt tolerated well, ankles are quickly losing ROM, so asked MD for resting foot splints.  Pt tolerated chair position in bed well up to 65 degrees with VSS on below listed vent settings.  He did not tolerate going flat very well, however, with sats dropping to the mid 80s when flat to scoot up.  Quickly rebounded once upright.   PT to follow acutely for deficits listed below.    Vent Settings: PRVC, 40%FiO2, PEEP 8 BP start: 148/41mmHg. BP HOB 55 degrees: 139/65mmHg BP HOB 65 degrees: 136/42mmHg    Follow Up Recommendations CIR    Equipment Recommendations  3in1 (PT)    Recommendations for Other Services Rehab consult     Precautions / Restrictions Precautions Precautions: Fall;Other (comment) Precaution Comments: Ventilator Settings: PRVC, 40%FiO2, PEEP 8 Required Braces or Orthoses: Other Brace Other Brace: Requesting resting foot splints      Mobility  Bed Mobility Overal bed mobility: Needs Assistance Bed Mobility: Rolling Rolling: Total assist;+2 for physical assistance;+2 for safety/equipment         General bed mobility comments: Two person total assist to reposition in bed, bed placed in Chair mode after moving  him up higher.  Pt tolerated up to 65 degrees of chair mode with VSS on current vent settings O2 sats in the low 90s (only dropped when we went flat to scoot him up--to 87% breifly).  HR in the 90s.                 Pertinent Vitals/Pain Pain Assessment: Faces Pain Score: 0-No pain Faces Pain Scale: No hurt    Home Living Family/patient expects to be discharged to:: Private residence Living Arrangements: Spouse/significant other Available Help at Discharge: Family;Available 24 hours/day Type of Home: House Home Access: Stairs to enter   [employer] of Steps: 1 Home Layout: One level Home Equipment: Walker - 2 wheels;Shower seat      Prior Function Level of Independence: Independent         Comments: works on machines full time, drives, likes to read his bible and go to church.      Hand Dominance   Dominant Hand: Right    Extremity/Trunk Assessment   Upper Extremity Assessment Upper Extremity Assessment: Defer to OT evaluation    Lower Extremity Assessment Lower Extremity Assessment: RLE deficits/detail;LLE deficits/detail RLE Deficits / Details: bil LEs with tight ankle DF, firm end feel, but responds to stretch.  I can get to neutral with prolonged stretch.  Will ask for resting foot splints.  Bil ankles without signs of pressure.  Knee and hip ROM preserved.  No spontaneous movement of LEs during session.  LLE Deficits / Details: bil LEs with tight ankle DF, firm end feel, but responds to stretch.  I can get to neutral with  prolonged stretch.  Will ask for resting foot splints.  Bil ankles without signs of pressure.  Knee and hip ROM preserved.  No spontaneous movement of LEs during session.     Cervical / Trunk Assessment Cervical / Trunk Assessment: Normal  Communication   Communication: Other (comment)(ETT)  Cognition Arousal/Alertness: Suspect due to medications;Lethargic                                     General Comments:  Pt sedated, lifted a bit, but not enough for him to arouse to voice or touch.       General Comments General comments (skin integrity, edema, etc.): Despite weaning of sedation pt does not respond to verbal or tactile stimuli. Provided PROM to UE to prevent joint contracture, promote mobility, and increase blood flow. Worked on pt's tolerance to sitting up in chair position in bed. Pt able to tolerate HOB 65 degrees with no change in vitals.    Exercises General Exercises - Upper Extremity Shoulder Flexion: PROM;10 reps(to 90 degrees only on LUE and ~45 degrees RUE due to vent) Shoulder ABduction: PROM;10 reps(semi reclined in bed) Shoulder ADduction: PROM;10 reps(semi reclined in bed) Elbow Flexion: PROM;10 reps(semi reclined in bed) Elbow Extension: 10 reps;PROM(semi reclined in bed) Wrist Flexion: PROM;10 reps(semi reclined in bed) Wrist Extension: PROM;10 reps(semi reclined in bed) Digit Composite Flexion: PROM(semi reclined in bed) Composite Extension: PROM(semi reclined in bed) General Exercises - Lower Extremity Ankle Circles/Pumps: PROM;Both;10 reps;Supine Heel Slides: PROM;Both;10 reps;Supine Hip ABduction/ADduction: PROM;Both;10 reps;Supine   Assessment/Plan    PT Assessment Patient needs continued PT services  PT Problem List Decreased strength;Decreased range of motion;Decreased activity tolerance;Decreased mobility;Decreased balance;Decreased knowledge of use of DME;Decreased knowledge of precautions;Cardiopulmonary status limiting activity       PT Treatment Interventions DME instruction;Gait training;Stair training;Functional mobility training;Therapeutic activities;Therapeutic exercise;Balance training;Neuromuscular re-education;Cognitive remediation;Patient/family education;Wheelchair mobility training;Manual techniques;Modalities    PT Goals (Current goals can be found in the Care Plan section)  Acute Rehab PT Goals Patient Stated Goal: wife wants him home PT Goal  Formulation: With family Time For Goal Achievement: 09/11/19 Potential to Achieve Goals: Fair    Frequency Min 2X/week(increase when more participatory)   Barriers to discharge        Co-evaluation PT/OT/SLP Co-Evaluation/Treatment: Yes Reason for Co-Treatment: Complexity of the patient's impairments (multi-system involvement);For patient/therapist safety PT goals addressed during session: Strengthening/ROM OT goals addressed during session: Strengthening/ROM       AM-PAC PT "6 Clicks" Mobility  Outcome Measure Help needed turning from your back to your side while in a flat bed without using bedrails?: Total Help needed moving from lying on your back to sitting on the side of a flat bed without using bedrails?: Total Help needed moving to and from a bed to a chair (including a wheelchair)?: Total Help needed standing up from a chair using your arms (e.g., wheelchair or bedside chair)?: Total Help needed to walk in hospital room?: Total Help needed climbing 3-5 steps with a railing? : Total 6 Click Score: 6    End of Session   Activity Tolerance: Patient limited by lethargy Patient left: in bed;Other (comment)(still in chair mode with HOB 65 degrees)   PT Visit Diagnosis: Muscle weakness (generalized) (M62.81);Difficulty in walking, not elsewhere classified (R26.2)    Time: 1275-1700 PT Time Calculation (min) (ACUTE ONLY): 33 min   Charges:       Corinna Capra, PT, DPT  Acute Rehabilitation #[phone] pager #[phone] office  @ [facility]: [phone]   PT Evaluation $PT Eval Moderate Complexity: 1 Mod          08/28/2019, 6:03 PM

## 2019-08-28 NOTE — Progress Notes (Signed)
VASCULAR LAB PRELIMINARY  PRELIMINARY  PRELIMINARY  PRELIMINARY  Bilateral lower extremity venous duplex completed.    Preliminary report:  See CV proc for preliminary results.  Amritha Yorke, RVT 08/28/2019, 1:31 PM

## 2019-08-28 NOTE — Progress Notes (Signed)
NAME:  [name] [name], MRN:  [id number], DOB:  October 18, 1955, LOS: 8 ADMISSION DATE:  08/08/2019, CONSULTATION DATE: 08/21/2019 REFERRING MD: Dr. Randol Kern, CHIEF COMPLAINT: Hypoxemic respiratory failure  Brief History   64 year old man with CAD, hypertension, diabetes, OSA.  Admitted with COVID-19 pneumonia and hypoxemic respiratory failure 12/30.  Past Medical History   has a past medical history of Chest pain, Coronary artery disease, Diabetes mellitus without complication (HCC), Dyspnea, Hyperlipidemia, Hypertension, Right bundle branch block, and Sleep apnea.   Significant Hospital Events   Admit 12/30 To ICU on 1.00 NRB, HHFNC.  Decadron started.  Remdesivir started,Tocilizumab x1, 12/30  12/31 empiric abx started 1/2 PCCM consulted. Intubated.  1/3 requiring neuromuscular blockade, PEEP and FiO2 improving.  Completed remdesivir 1/4: Started on prone positioning protocol given PDF less than 150.  Recultured for fever spike developed narrow complex tachycardia with a rate in the 160s to 170s, was given adenosine which identified underlying atrial flutter, loaded with amiodarone and infusion initiated 1/5: Ongoing fever, empiric nosocomial coverage initiated, in spite of this making improvement and FiO2 and PEEP requirements 1/6: Still spiking fevers, did have hemoglobin drop of almost 2 g, checking lower extremity ultrasounds to rule out venous thrombosis 1/7:still having fever [place] LEs still pending. Failed SBT (no spont effort) Consults:  PCCM 12/31  Procedures:  ET tube 1/2 >>  Right IJ CVC 1/2 >>   Significant Diagnostic Tests:  Lower extremity ultrasound 1/6 Micro Data:  SARS CoV 2 12/27 >> positive MRSA screen 12/31 >> negative Blood 12/30 >> Respiratory culture 1/4>>> Urine culture 1/5>> Antimicrobials:  Remdesivir 12/30 >> 1/3 Tocilizumab x1, 12/30 Azithromycin 12/31 >> 1/4 Ceftriaxone 12/31 >> 1/4 Cefepime 1/5>>> Vancomycin 1/5>>> Interim history/subjective:    Tolerating supine position well Objective   Blood pressure (Abnormal) 127/52, pulse 87, temperature 100.2 F (37.9 C), resp. rate (Abnormal) 30, height 5\' 10"  (1.778 m), weight 88.6 kg, SpO2 (Abnormal) 89 %. CVP:  [7 mmHg] 7 mmHg  Vent Mode: PRVC FiO2 (%):  [50 %] 50 % Set Rate:  [30 bmp] 30 bmp Vt Set:  [500 mL] 500 mL PEEP:  [10 cmH20] 10 cmH20 Plateau Pressure:  [21 cmH20-26 cmH20] 21 cmH20   Intake/Output Summary (Last 24 hours) at 08/28/2019 0902 Last data filed at 08/28/2019 0800 Gross per 24 hour  Intake 2591.29 ml  Output 4220 ml  Net -1628.71 ml   Filed Weights   08/26/19 0343 08/27/19 0412 08/28/19 0342  Weight: 83.3 kg 88.4 kg 88.6 kg    Examination: General 63 yom sedated on vent did not have spont effort on SBT HENT NCAT orally intubated Pulm decreased bilaterally equal chest rise Ventilator assessment: FiO2: 40 PEEP:8  Plateau pressure:24 Card RRR (SR) abd not tender + bowel sounds  Ext LE edema dependent and 1+ gu clear yellow Neuro heavily sedated   Resolved Hospital Problem list   Treated empirically for CAP antibiotics discontinued 1/5  Assessment & Plan:   Acute hypoxemic respiratory failure due to COVID-19 pneumonia/pneumonitis Ruling out HCAP 1/5 Continues to demonstrate daily improvement Portable chest x-ray personally reviewed: Daily improved aeration bilaterally plan Continue low tidal volume ventilation at 6 mL/kg predicted body weight Continue to adjust PEEP and FiO2 for target PaO2 55 to 65% Plateau pressure goal less than 30, driving pressure goal less than 15 Continuing IV diuresis Day #3 empiric cefepime and vancomycin for potential HCAP, he has clinically improved with this however cultures were not sent, awaiting MRSA PCR, need to ensure it was sent  if this is negative we will discontinue vancomycin and complete 7 days of cefepime for possible pneumonia no organism specified Sedation RASS goal -1 Add precedex, stop versed and fent  gtts. PRN fent Clonazepam yesterday on 1/6 Day #9 of 10 Decadron RASS goal -2 VAP bundle   Fever on 1/4: Cultures obtained.  No spike in white blood cell count, spiked again on 1/5 fevers persist, no culture data to explain this as of yet Plan Antibiotics as mentioned above Await vascular ultrasound of lower extremities to rule out DVT  Fluid and electrolyte imbalance: Hypernatremia, improved since added free water on 1/4 Plan Continue free water at 250 mL's every 4 hours Add D5 water at 50 mL an hour A.m. chemistry  Mild AKI, I suspect secondary to diuretics, as improved with holding Lasix, now normalized Plan Repeat Lasix again today A.m. chemistry Renally adjust medications as indicated  DM 2 with hyperglycemia Plan Continue Levemir 28 units daily, also basal NovoLog 8 units every 4  Continue sliding scale insulin  Anticipate glycemic control will improve once Decadron stopped    Transaminitis -Remdesivir's discontinued on 1/3, improving Plan Intermittent LFT checks as now normalizing  Hx HTN Plan Holding antihypertensives with sedation  New onset atrial fib/a flutter after first proning attempt on 1/4 Plan Discontinue amiodarone today  CAD Plan Continue aspirin and statin  Toxic metabolic encephalopathy, need for sedation to facilitate mechanical ventilation and h/o depression Plan Continue Lexapro and Wellbutrin Sedation approach as mentioned above    Best practice:  Diet: NPO tubefeeds 1/4 Pain/Anxiety/Delirium protocol (if indicated): PAD versed, fentanyl VAP protocol (if indicated): N/A DVT prophylaxis:enoxaparin GI prophylaxis: Pepcid Glucose control: Sliding scale insulin, Levemir Mobility: Bedrest Code Status: Full Family Communication: Discussed with wife on 1/3 Disposition: ICU; critically ill. Headed the right direction.   Critical care x 32 minutes  Erick Colace ACNP-BC [place] Pager # [phone] OR #  [phone] if no answer

## 2019-08-29 ENCOUNTER — Inpatient Hospital Stay (HOSPITAL_COMMUNITY): Payer: BC Managed Care – PPO

## 2019-08-29 DIAGNOSIS — G9341 Metabolic encephalopathy: Secondary | ICD-10-CM

## 2019-08-29 LAB — COMPREHENSIVE METABOLIC PANEL
ALT: 42 U/L (ref 0–44)
AST: 75 U/L — ABNORMAL HIGH (ref 15–41)
Albumin: 2.2 g/dL — ABNORMAL LOW (ref 3.5–5.0)
Alkaline Phosphatase: 67 U/L (ref 38–126)
Anion gap: 9 (ref 5–15)
BUN: 62 mg/dL — ABNORMAL HIGH (ref 8–23)
CO2: 27 mmol/L (ref 22–32)
Calcium: 7.9 mg/dL — ABNORMAL LOW (ref 8.9–10.3)
Chloride: 112 mmol/L — ABNORMAL HIGH (ref 98–111)
Creatinine, Ser: 0.86 mg/dL (ref 0.61–1.24)
GFR calc Af Amer: >60 mL/min (ref >60)
GFR calc non Af Amer: >60 mL/min (ref >60)
Glucose, Bld: 296 mg/dL — ABNORMAL HIGH (ref 70–99)
Potassium: 4 mmol/L (ref 3.5–5.1)
Sodium: 148 mmol/L — ABNORMAL HIGH (ref 135–145)
Total Bilirubin: 0.8 mg/dL (ref 0.3–1.2)
Total Protein: 5 g/dL — ABNORMAL LOW (ref 6.5–8.1)

## 2019-08-29 LAB — POCT I-STAT 7, (LYTES, BLD GAS, ICA,H+H)
Acid-Base Excess: 3 mmol/L — ABNORMAL HIGH (ref 0.0–2.0)
Acid-Base Excess: 3 mmol/L — ABNORMAL HIGH (ref 0.0–2.0)
Bicarbonate: 26 mmol/L (ref 20.0–28.0)
Bicarbonate: 29.2 mmol/L — ABNORMAL HIGH (ref 20.0–28.0)
Calcium, Ion: 1.21 mmol/L (ref 1.15–1.40)
Calcium, Ion: 1.2 mmol/L (ref 1.15–1.40)
HCT: 35 % — ABNORMAL LOW (ref 39.0–52.0)
HCT: 37 % — ABNORMAL LOW (ref 39.0–52.0)
Hemoglobin: 11.9 g/dL — ABNORMAL LOW (ref 13.0–17.0)
Hemoglobin: 12.6 g/dL — ABNORMAL LOW (ref 13.0–17.0)
O2 Saturation: 82 %
O2 Saturation: 92 %
Patient temperature: 99.4
Potassium: 4.1 mmol/L (ref 3.5–5.1)
Potassium: 4.6 mmol/L (ref 3.5–5.1)
Sodium: 150 mmol/L — ABNORMAL HIGH (ref 135–145)
Sodium: 150 mmol/L — ABNORMAL HIGH (ref 135–145)
TCO2: 27 mmol/L (ref 22–32)
TCO2: 31 mmol/L (ref 22–32)
pCO2 arterial: 32.1 mmHg (ref 32.0–48.0)
pCO2 arterial: 49.7 mmHg — ABNORMAL HIGH (ref 32.0–48.0)
pH, Arterial: 7.517 — ABNORMAL HIGH (ref 7.350–7.450)
pH, Arterial: 7.379 (ref 7.350–7.450)
pO2, Arterial: 41 mmHg — ABNORMAL LOW (ref 83.0–108.0)
pO2, Arterial: 67 mmHg — ABNORMAL LOW (ref 83.0–108.0)

## 2019-08-29 LAB — CBC
HCT: 37.7 % — ABNORMAL LOW (ref 39.0–52.0)
Hemoglobin: 12 g/dL — ABNORMAL LOW (ref 13.0–17.0)
MCH: 31.7 pg (ref 26.0–34.0)
MCHC: 31.8 g/dL (ref 30.0–36.0)
MCV: 99.7 fL (ref 80.0–100.0)
Platelets: 217 10*3/uL (ref 150–400)
RBC: 3.78 MIL/uL — ABNORMAL LOW (ref 4.22–5.81)
RDW: 14.6 % (ref 11.5–15.5)
WBC: 10.3 10*3/uL (ref 4.0–10.5)
nRBC: 0.2 % (ref 0.0–0.2)

## 2019-08-29 LAB — GLUCOSE, CAPILLARY
Glucose-Capillary: 321 mg/dL — ABNORMAL HIGH (ref 70–99)
Glucose-Capillary: 275 mg/dL — ABNORMAL HIGH (ref 70–99)
Glucose-Capillary: 256 mg/dL — ABNORMAL HIGH (ref 70–99)
Glucose-Capillary: 259 mg/dL — ABNORMAL HIGH (ref 70–99)
Glucose-Capillary: 161 mg/dL — ABNORMAL HIGH (ref 70–99)
Glucose-Capillary: 169 mg/dL — ABNORMAL HIGH (ref 70–99)

## 2019-08-29 MED ORDER — PROPOFOL 1000 MG/100ML IV EMUL
INTRAVENOUS | Status: AC
  Administered 2019-08-29: 11:00:00 10 ug/kg/min via INTRAVENOUS
  Filled 2019-08-29: qty 100

## 2019-08-29 MED ORDER — OXYCODONE HCL 5 MG PO TABS: 10.0000 mg | ORAL_TABLET | Freq: Three times a day (TID) | ORAL | Status: DC

## 2019-08-29 MED ORDER — POTASSIUM CHLORIDE 20 MEQ/15ML (10%) PO SOLN
40.0000 meq | Freq: Once | ORAL | Status: AC
  Administered 2019-08-29: 40 meq
  Filled 2019-08-29: qty 30

## 2019-08-29 MED ORDER — CLONAZEPAM 1 MG PO TABS
2.0000 mg | ORAL_TABLET | Freq: Two times a day (BID) | ORAL | Status: DC
  Administered 2019-08-29 – 2019-09-10 (×25): 2 mg
  Filled 2019-08-29 (×25): qty 2

## 2019-08-29 MED ORDER — PROPOFOL 1000 MG/100ML IV EMUL
0.0000 ug/kg/min | INTRAVENOUS | Status: DC
  Administered 2019-08-29 – 2019-09-02 (×20): 40 ug/kg/min via INTRAVENOUS
  Filled 2019-08-29 (×19): qty 100

## 2019-08-29 MED ORDER — HYDROMORPHONE HCL 1 MG/ML IJ SOLN
1.0000 mg | Freq: Once | INTRAMUSCULAR | Status: AC
  Administered 2019-08-29: 16:00:00 1 mg via INTRAVENOUS

## 2019-08-29 MED ORDER — SODIUM CHLORIDE 0.9 % IV SOLN
0.5000 mg/h | INTRAVENOUS | Status: DC
  Administered 2019-08-29 – 2019-08-30 (×2): 0.5 mg/h via INTRAVENOUS
  Administered 2019-08-31 – 2019-09-02 (×4): 4 mg/h via INTRAVENOUS
  Administered 2019-09-03: 2 mg/h via INTRAVENOUS
  Administered 2019-09-05: 3 mg/h via INTRAVENOUS
  Administered 2019-09-07: 0.5 mg/h via INTRAVENOUS
  Filled 2019-08-29 (×15): qty 5

## 2019-08-29 MED ORDER — FUROSEMIDE 10 MG/ML IJ SOLN
80.0000 mg | Freq: Two times a day (BID) | INTRAMUSCULAR | Status: AC
  Administered 2019-08-29 (×2): 80 mg via INTRAVENOUS
  Filled 2019-08-29 (×2): qty 8

## 2019-08-29 MED ORDER — FREE WATER
300.0000 mL | Status: DC
  Administered 2019-08-29 – 2019-09-13 (×79): 300 mL

## 2019-08-29 MED ORDER — HYDROMORPHONE BOLUS VIA INFUSION
0.5000 mg | INTRAVENOUS | Status: DC | PRN
  Administered 2019-08-29 – 2019-09-09 (×3): 0.5 mg via INTRAVENOUS
  Filled 2019-08-29: qty 1

## 2019-08-29 MED ORDER — MIDAZOLAM HCL 2 MG/2ML IJ SOLN
2.0000 mg | INTRAMUSCULAR | Status: DC | PRN
  Administered 2019-08-30 – 2019-08-31 (×3): 2 mg via INTRAVENOUS
  Filled 2019-08-29 (×3): qty 2

## 2019-08-29 MED ORDER — MAGNESIUM SULFATE 2 GM/50ML IV SOLN
2.0000 g | Freq: Once | INTRAVENOUS | Status: AC
  Administered 2019-08-29: 2 g via INTRAVENOUS
  Filled 2019-08-29: qty 50

## 2019-08-29 NOTE — Progress Notes (Signed)
Rehab Admissions Coordinator Note:  Patient was screened by Stephania Fragmin for appropriateness for an Inpatient Acute Rehab Consult.  Pt still vented.  Will follow from a distance.   Stephania Fragmin 08/29/2019, 9:37 AM  I can be reached at [phone].

## 2019-08-29 NOTE — Progress Notes (Signed)
Wife was updated over the phone including events from yesterday and today, current plan of care, and current status.  All questions were answered.  She is aware that critical care staff will only be reaching out over the weekend on an as-needed basis.  We will plan to revisit Monday formally to review the weekend, talk about current findings and ongoing plan of care at that point will answer any additional questions that have come up over the weekend  Simonne Martinet ACNP-BC [facility] Pulmonary/Critical Care Pager # [phone] OR # [phone] if no answer

## 2019-08-29 NOTE — Progress Notes (Signed)
NAME:  [name] [name], MRN:  [id number], DOB:  1956-04-26, LOS: 1 ADMISSION DATE:  07/26/2019, CONSULTATION DATE: 08/21/2019 REFERRING MD: Dr. Waldron Labs, CHIEF COMPLAINT: Hypoxemic respiratory failure  Brief History   64 year old man with CAD, hypertension, diabetes, OSA.  Admitted with COVID-19 pneumonia and hypoxemic respiratory failure 12/30.  Past Medical History   has a past medical history of Chest pain, Coronary artery disease, Diabetes mellitus without complication ([place]), Dyspnea, Hyperlipidemia, Hypertension, Right bundle branch block, and Sleep apnea.   Significant Hospital Events   Admit 12/30 To ICU on 1.00 NRB, HHFNC.  Decadron started.  Remdesivir started,Tocilizumab x1, 12/30  12/31 empiric abx started 1/2 PCCM consulted. Intubated.  1/3 requiring neuromuscular blockade, PEEP and FiO2 improving.  Completed remdesivir 1/4: Started on prone positioning protocol given PDF less than 150.  Recultured for fever spike developed narrow complex tachycardia with a rate in the 160s to 170s, was given adenosine which identified underlying atrial flutter, loaded with amiodarone and infusion initiated 1/5: Ongoing fever, empiric nosocomial coverage initiated, in spite of this making improvement and FiO2 and PEEP requirements 1/6: Still spiking fevers, did have hemoglobin drop of almost 2 g, checking lower extremity ultrasounds to rule out venous thrombosis 1/7:still having fever [place] LEs still pending. Failed SBT (no spont effort), during the afternoon hours developed worsening hypoxia in case of dyssynchrony, IV fentanyl added back 1/8: Continues to require more oxygen & peep still having trouble with ventilator synchrony changing sedation regimen to Dilaudid and propofol Consults:  PCCM 12/31  Procedures:  ET tube 1/2 >>  Right IJ CVC 1/2 >>   Significant Diagnostic Tests:  Lower extremity ultrasound 1/6: Negative Micro Data:  SARS CoV 2 12/27 >> positive MRSA screen 12/31 >>  negative Blood 12/30 >> Respiratory culture 1/4>>> Urine culture 1/5>> Antimicrobials:  Remdesivir 12/30 >> 1/3 Tocilizumab x1, 12/30 Azithromycin 12/31 >> 1/4 Ceftriaxone 12/31 >> 1/4 Cefepime 1/5>>>  Vancomycin 1/5>>> Interim history/subjective:  Looks worse today Objective   Blood pressure (Abnormal) 142/46, pulse 71, temperature 98.8 F (37.1 C), resp. rate (Abnormal) 22, height 5\' 10"  (1.778 m), weight 88.6 kg, SpO2 (Abnormal) 80 %.    Vent Mode: PRVC FiO2 (%):  [50 %-80 %] 80 % Set Rate:  [30 bmp] 30 bmp Vt Set:  [500 mL] 500 mL PEEP:  [8 cmH20] 8 cmH20 Plateau Pressure:  [18 cmH20-20 cmH20] 20 cmH20   Intake/Output Summary (Last 24 hours) at 08/29/2019 0848 Last data filed at 08/29/2019 0800 Gross per 24 hour  Intake 3188.14 ml  Output 3125 ml  Net 63.14 ml   Filed Weights   08/26/19 0343 08/27/19 0412 08/28/19 0342  Weight: 83.3 kg 88.4 kg 88.6 kg    Examination: General 64 year old male patient currently sedated on Precedex and fentanyl infusion HEENT normocephalic atraumatic orally intubated no JVD or adenopathy Pulmonary coarse scattered rhonchi accessory use with marked ventilator asynchrony Ventilator assessment: FiO2: 100% PEEP: 14 Plateau pressure: Unable to assess due to ventilator asynchrony currently Cardiac regular rhythm sinus Abdomen soft nontender Extremities are warm and dry trace lower extremity edema brisk cap refill Neuro heavily sedated    Resolved Hospital Problem list   Treated empirically for CAP antibiotics discontinued 1/5 transeminitis  Atrial fibrillation/flutter, this continued for 48 hours and resolved on 1/6 Assessment & Plan:   Acute hypoxemic respiratory failure due to COVID-19 pneumonia/pneumonitis Ruling out HCAP 1/5 Had been demonstrating daily improvement until 1/7 afternoon.  Over the course of the evening has required progressive supplemental oxygen No  significant fever spike, marginal increase in white blood cell  count plan Continue low tidal volume ventilation at 6 mL/kg predicted body weight Continue to adjust PEEP/FiO2 for target PaO2 55 to 65%, hoping we can begin to wean FiO2 and PEEP again once ventilator mechanics and synchrony better Plateau pressure goal less than 30, driving pressure goal less than 15 IV diuresis Chest x-ray now Day #4 empiric cefepime and vancomycin, planning to treat for 7 days Day #8 of 10 Decadron Sedation protocol, RASS goal -4 Changing Precedex to propofol and fentanyl to Dilaudid Continue clonazepam Continue at bedtime melatonin VAP bundle   Fever on 1/4: Cultures obtained.  No spike in white blood cell count, spiked again on 1/5 fevers persist, no culture data to explain this as of yet, no significant fever last night Plan Follow cultures as mentioned above  Fluid and electrolyte imbalance: Hypernatremia, volume overload improved since added free water on 1/4 Plan Continue free water, but increase to 300 every 4 hours  A.m. chemistry  Lasix x2 today  Mild AKI, I suspect secondary to diuretics, as improved with holding Lasix, now normalized Plan A.m. chemistry in light of diuretic escalation Renal dose medications  DM 2 with hyperglycemia Plan Continue Levemir at 28 units daily, NovoLog every 4 hours 10 units, continue sliding scale  Anticipate this will improve once Decadron stopped  Hx HTN Plan Continue to hold antihypertensives  CAD Plan Continue aspirin and statin  Toxic metabolic encephalopathy, need for sedation to facilitate mechanical ventilation and h/o depression Plan Continue Lexapro and Wellbutrin Sedation as mentioned above    Best practice:  Diet: NPO tubefeeds 1/4 Pain/Anxiety/Delirium protocol (if indicated): PAD versed, fentanyl VAP protocol (if indicated): N/A DVT prophylaxis:enoxaparin GI prophylaxis: Pepcid Glucose control: Sliding scale insulin, Levemir Mobility: Bedrest Code Status: Full Family Communication:  Discussed with wife on 1/3 Disposition: ICU; critically ill. Headed the right direction.   Critical care x32 minutes  Simonne Martinet ACNP-BC [facility] Pulmonary/Critical Care Pager # [phone] OR # [phone] if no answer

## 2019-08-30 LAB — COMPREHENSIVE METABOLIC PANEL
ALT: 35 U/L (ref 0–44)
AST: 60 U/L — ABNORMAL HIGH (ref 15–41)
Albumin: 2.1 g/dL — ABNORMAL LOW (ref 3.5–5.0)
Alkaline Phosphatase: 65 U/L (ref 38–126)
Anion gap: 10 (ref 5–15)
BUN: 62 mg/dL — ABNORMAL HIGH (ref 8–23)
CO2: 26 mmol/L (ref 22–32)
Calcium: 6.8 mg/dL — ABNORMAL LOW (ref 8.9–10.3)
Chloride: 111 mmol/L (ref 98–111)
Creatinine, Ser: 0.91 mg/dL (ref 0.61–1.24)
GFR calc Af Amer: >60 mL/min (ref >60)
GFR calc non Af Amer: >60 mL/min (ref >60)
Glucose, Bld: 166 mg/dL — ABNORMAL HIGH (ref 70–99)
Potassium: 3.8 mmol/L (ref 3.5–5.1)
Sodium: 147 mmol/L — ABNORMAL HIGH (ref 135–145)
Total Bilirubin: 0.6 mg/dL (ref 0.3–1.2)
Total Protein: 4.7 g/dL — ABNORMAL LOW (ref 6.5–8.1)

## 2019-08-30 LAB — CBC
HCT: 40.5 % (ref 39.0–52.0)
Hemoglobin: 12.4 g/dL — ABNORMAL LOW (ref 13.0–17.0)
MCH: 31.2 pg (ref 26.0–34.0)
MCHC: 30.6 g/dL (ref 30.0–36.0)
MCV: 101.8 fL — ABNORMAL HIGH (ref 80.0–100.0)
Platelets: 242 10*3/uL (ref 150–400)
RBC: 3.98 MIL/uL — ABNORMAL LOW (ref 4.22–5.81)
RDW: 14.7 % (ref 11.5–15.5)
WBC: 14 10*3/uL — ABNORMAL HIGH (ref 4.0–10.5)
nRBC: 0.2 % (ref 0.0–0.2)

## 2019-08-30 LAB — CULTURE, BLOOD (ROUTINE X 2)
Culture: NO GROWTH
Culture: NO GROWTH
Special Requests: ADEQUATE
Special Requests: ADEQUATE

## 2019-08-30 LAB — POCT I-STAT 7, (LYTES, BLD GAS, ICA,H+H)
Acid-Base Excess: 1 mmol/L (ref 0.0–2.0)
Bicarbonate: 27.8 mmol/L (ref 20.0–28.0)
Calcium, Ion: 1.23 mmol/L (ref 1.15–1.40)
HCT: 39 % (ref 39.0–52.0)
Hemoglobin: 13.3 g/dL (ref 13.0–17.0)
O2 Saturation: 95 %
Potassium: 5 mmol/L (ref 3.5–5.1)
Sodium: 147 mmol/L — ABNORMAL HIGH (ref 135–145)
TCO2: 29 mmol/L (ref 22–32)
pCO2 arterial: 51.7 mmHg — ABNORMAL HIGH (ref 32.0–48.0)
pH, Arterial: 7.339 — ABNORMAL LOW (ref 7.350–7.450)
pO2, Arterial: 83 mmHg (ref 83.0–108.0)

## 2019-08-30 LAB — TRIGLYCERIDES: Triglycerides: 248 mg/dL — ABNORMAL HIGH (ref <150)

## 2019-08-30 LAB — GLUCOSE, CAPILLARY
Glucose-Capillary: 176 mg/dL — ABNORMAL HIGH (ref 70–99)
Glucose-Capillary: 181 mg/dL — ABNORMAL HIGH (ref 70–99)
Glucose-Capillary: 185 mg/dL — ABNORMAL HIGH (ref 70–99)
Glucose-Capillary: 193 mg/dL — ABNORMAL HIGH (ref 70–99)
Glucose-Capillary: 211 mg/dL — ABNORMAL HIGH (ref 70–99)
Glucose-Capillary: 246 mg/dL — ABNORMAL HIGH (ref 70–99)

## 2019-08-30 MED ORDER — POLYETHYLENE GLYCOL 3350 17 G PO PACK
17.0000 g | PACK | Freq: Two times a day (BID) | ORAL | Status: DC
  Administered 2019-08-30 – 2019-09-12 (×18): 17 g
  Filled 2019-08-30 (×19): qty 1

## 2019-08-30 MED ORDER — POTASSIUM CHLORIDE 20 MEQ PO PACK
40.0000 meq | PACK | Freq: Once | ORAL | Status: AC
  Administered 2019-08-30: 40 meq via ORAL
  Filled 2019-08-30: qty 2

## 2019-08-30 MED ORDER — VECURONIUM BROMIDE 10 MG IV SOLR
10.0000 mg | Freq: Once | INTRAVENOUS | Status: AC
  Administered 2019-08-30: 10:00:00 10 mg via INTRAVENOUS
  Filled 2019-08-30: qty 10

## 2019-08-30 NOTE — Progress Notes (Signed)
Pt's head turned and arms rotated with no complications.  

## 2019-08-30 NOTE — Progress Notes (Signed)
Pt proned without any complications. ETT secured with silk tape.  Mepilex applied to protect skin

## 2019-08-30 NOTE — Progress Notes (Signed)
59 mL of Hydromorphone infusion wasted with Fonnie Jarvis, RN due to expiration of bag.

## 2019-08-30 NOTE — Progress Notes (Signed)
Spoke on the phone with patient's daughter. I updated her to the patient's current condition and plan of care. She stated no further questions at this time.

## 2019-08-30 NOTE — Progress Notes (Signed)
Patient's daughter called for an update on the patient's condition. Plan of care and current condition were explained. All questions answered at this time.

## 2019-08-30 NOTE — Progress Notes (Signed)
NAME:  [name] [name], MRN:  [id number], DOB:  [birth year], LOS: 21 ADMISSION DATE:  08/21/2019, CONSULTATION DATE: 08/21/2019 REFERRING MD: Dr. Waldron Labs, CHIEF COMPLAINT: Hypoxemic respiratory failure  Brief History   64 year old man with CAD, hypertension, diabetes, OSA.  Admitted with COVID-19 pneumonia and hypoxemic respiratory failure 12/30.  Past Medical History   has a past medical history of Chest pain, Coronary artery disease, Diabetes mellitus without complication ([place]), Dyspnea, Hyperlipidemia, Hypertension, Right bundle branch block, and Sleep apnea.   Significant Hospital Events   Admit 12/30 To ICU on 1.00 NRB, HHFNC.  Decadron started.  Remdesivir started,Tocilizumab x1, 12/30  12/31 empiric abx started 1/2 PCCM consulted. Intubated.  1/3 requiring neuromuscular blockade, PEEP and FiO2 improving.  Completed remdesivir 1/4: Started on prone positioning protocol given PDF less than 150.  Recultured for fever spike developed narrow complex tachycardia with a rate in the 160s to 170s, was given adenosine which identified underlying atrial flutter, loaded with amiodarone and infusion initiated 1/5: Ongoing fever, empiric nosocomial coverage initiated, in spite of this making improvement and FiO2 and PEEP requirements 1/6: Still spiking fevers, did have hemoglobin drop of almost 2 g, checking lower extremity ultrasounds to rule out venous thrombosis 1/7:still having fever [place] LEs still pending. Failed SBT (no spont effort), during the afternoon hours developed worsening hypoxia in case of dyssynchrony, IV fentanyl added back 1/8: Continues to require more oxygen & peep still having trouble with ventilator synchrony changing sedation regimen to Dilaudid and propofol Consults:  PCCM 12/31  Procedures:  ET tube 1/2 >>  Right IJ CVC 1/2 >>   Significant Diagnostic Tests:  Lower extremity ultrasound 1/6: Negative Micro Data:  SARS CoV 2 12/27 >> positive MRSA screen 12/31 >>  negative Blood 12/30 >> Respiratory culture 1/4>>> Urine culture 1/5>> Antimicrobials:  Remdesivir 12/30 >> 1/3 Tocilizumab x1, 12/30 Azithromycin 12/31 >> 1/4 Ceftriaxone 12/31 >> 1/4 Cefepime 1/5>>>  Vancomycin 1/5>>> Interim history/subjective:  Looks worse today Objective   Blood pressure (!) 158/62, pulse 96, temperature 99.7 F (37.6 C), resp. rate (!) 21, height 5\' 10"  (1.778 m), weight 88.1 kg, SpO2 100 %. CVP:  [3 mmHg-36 mmHg] 8 mmHg  Vent Mode: PRVC FiO2 (%):  [80 %-100 %] 80 % Set Rate:  [30 bmp] 30 bmp Vt Set:  [500 mL] 500 mL PEEP:  [12 cmH20] 12 cmH20 Plateau Pressure:  [22 cmH20-26 cmH20] 26 cmH20   Intake/Output Summary (Last 24 hours) at 08/30/2019 1316 Last data filed at 08/30/2019 0600 Gross per 24 hour  Intake 2190.29 ml  Output 3225 ml  Net -1034.71 ml   Filed Weights   08/27/19 0412 08/28/19 0342 08/30/19 0500  Weight: 88.4 kg 88.6 kg 88.1 kg    Examination: General: NAD, sedated HEENT [place]/AT; ETT in place, w/o pressure markings.  Pulmonary coarse scattered rhonchi  Ventilator assessment: FiO2: 100% PEEP: 14 Plateau pressure: 28 CV: RRR, no r/m/g Abdomen:SND Extremities: warm, dry, trace edema, cap refill <2 sec Neuro heavily sedated    Resolved Hospital Problem list   Treated empirically for CAP antibiotics discontinued 1/5 Atrial fibrillation/flutter, this continued for 48 hours and resolved on 1/6 Assessment & Plan:   Acute hypoxemic respiratory failure due to COVID-19 pneumonia/pneumonitis Ruled out HCAP 1/5 No significant fever spike, marginal increase in white blood cell count PLAN: --Continue low tidal volume ventilation at 6 mL/kg predicted body weight  --ABG; Continue to adjust PEEP/FiO2 for target PaO2 55 to 65%, hoping we can begin to wean FiO2 and PEEP  again once ventilator mechanics and synchrony better. Plateau pressure goal less than 30, driving pressure goal less than 15 --periodic paralysis for better vent mechanics as  needed --diuresis --Day #5 empiric cefepime and vancomycin, planning to treat for 7 days --Day #9 of 10 Decadron Sedation protocol, RASS goal -4 Changing Precedex to propofol and fentanyl to Dilaudid Continue clonazepam Continue at bedtime melatonin VAP bundle  F/E/N Plan:  --monitor electrolytes, LFTs, replete/treat as necessary --tube feeds --free water    ID: Fever on 1/4: Cultures obtained.  No spike in white blood cell count, spiked again on 1/5 fevers persist, no culture data to explain this as of yet, no significant fever last night Plan Follow cultures   Mild AKI, I suspect secondary to diuretics, as improved with holding Lasix, now normalized Plan --follow A.m. BUN/Cr --Renal dose medications  DM 2 with hyperglycemia Plan --Continue Levemir, NovoLog, sliding scale  --Anticipate this will improve once Decadron stopped  Hx HTN Plan Continue to hold antihypertensives  CAD Plan Continue aspirin and statin  Toxic metabolic encephalopathy, need for sedation to facilitate mechanical ventilation and h/o depression Plan Continue Lexapro and Wellbutrin Sedation as mentioned above    Best practice:  Diet: tubefeeds 1/4 Pain/Anxiety/Delirium protocol (if indicated): PAD versed, fentanyl VAP protocol (if indicated): N/A DVT prophylaxis:enoxaparin GI prophylaxis: Pepcid Glucose control: Sliding scale insulin, Levemir Mobility: Bedrest Code Status: Full Family Communication: Discussed with wife on 1/3 Disposition: ICU; critically ill. Headed the right direction.    I have independently seen and examined the patient, reviewed data, and developed an assessment and plan. A total of 40 minutes were spent in critical care assessment and medical decision making. This critical care time does not reflect procedure time, or teaching time or supervisory time of PA/NP/Med student/Med Resident, etc but could involve care discussion time. Agree with the documentation above.     Gwynne Edinger, MD PhD 08/30/19 1:31 PM

## 2019-08-31 LAB — POCT I-STAT 7, (LYTES, BLD GAS, ICA,H+H)
Acid-Base Excess: 2 mmol/L (ref 0.0–2.0)
Bicarbonate: 28.7 mmol/L — ABNORMAL HIGH (ref 20.0–28.0)
Calcium, Ion: 1.23 mmol/L (ref 1.15–1.40)
HCT: 38 % — ABNORMAL LOW (ref 39.0–52.0)
Hemoglobin: 12.9 g/dL — ABNORMAL LOW (ref 13.0–17.0)
O2 Saturation: 88 %
Potassium: 4 mmol/L (ref 3.5–5.1)
Sodium: 146 mmol/L — ABNORMAL HIGH (ref 135–145)
TCO2: 30 mmol/L (ref 22–32)
pCO2 arterial: 50.2 mmHg — ABNORMAL HIGH (ref 32.0–48.0)
pH, Arterial: 7.365 (ref 7.350–7.450)
pO2, Arterial: 58 mmHg — ABNORMAL LOW (ref 83.0–108.0)

## 2019-08-31 LAB — COMPREHENSIVE METABOLIC PANEL
ALT: 60 U/L — ABNORMAL HIGH (ref 0–44)
AST: 68 U/L — ABNORMAL HIGH (ref 15–41)
Albumin: 2.5 g/dL — ABNORMAL LOW (ref 3.5–5.0)
Alkaline Phosphatase: 91 U/L (ref 38–126)
Anion gap: 9 (ref 5–15)
BUN: 67 mg/dL — ABNORMAL HIGH (ref 8–23)
CO2: 28 mmol/L (ref 22–32)
Calcium: 8.1 mg/dL — ABNORMAL LOW (ref 8.9–10.3)
Chloride: 107 mmol/L (ref 98–111)
Creatinine, Ser: 1 mg/dL (ref 0.61–1.24)
GFR calc Af Amer: >60 mL/min (ref >60)
GFR calc non Af Amer: >60 mL/min (ref >60)
Glucose, Bld: 204 mg/dL — ABNORMAL HIGH (ref 70–99)
Potassium: 4.6 mmol/L (ref 3.5–5.1)
Sodium: 144 mmol/L (ref 135–145)
Total Bilirubin: 0.7 mg/dL (ref 0.3–1.2)
Total Protein: 5.8 g/dL — ABNORMAL LOW (ref 6.5–8.1)

## 2019-08-31 LAB — CULTURE, RESPIRATORY W GRAM STAIN

## 2019-08-31 LAB — CBC
HCT: 39.7 % (ref 39.0–52.0)
Hemoglobin: 12.2 g/dL — ABNORMAL LOW (ref 13.0–17.0)
MCH: 31 pg (ref 26.0–34.0)
MCHC: 30.7 g/dL (ref 30.0–36.0)
MCV: 101 fL — ABNORMAL HIGH (ref 80.0–100.0)
Platelets: 236 10*3/uL (ref 150–400)
RBC: 3.93 MIL/uL — ABNORMAL LOW (ref 4.22–5.81)
RDW: 14.6 % (ref 11.5–15.5)
WBC: 12.5 10*3/uL — ABNORMAL HIGH (ref 4.0–10.5)
nRBC: 0 % (ref 0.0–0.2)

## 2019-08-31 LAB — GLUCOSE, CAPILLARY
Glucose-Capillary: 150 mg/dL — ABNORMAL HIGH (ref 70–99)
Glucose-Capillary: 151 mg/dL — ABNORMAL HIGH (ref 70–99)
Glucose-Capillary: 167 mg/dL — ABNORMAL HIGH (ref 70–99)
Glucose-Capillary: 174 mg/dL — ABNORMAL HIGH (ref 70–99)
Glucose-Capillary: 206 mg/dL — ABNORMAL HIGH (ref 70–99)
Glucose-Capillary: 286 mg/dL — ABNORMAL HIGH (ref 70–99)

## 2019-08-31 LAB — TRIGLYCERIDES: Triglycerides: 233 mg/dL — ABNORMAL HIGH (ref <150)

## 2019-08-31 MED ORDER — VECURONIUM BROMIDE 10 MG IV SOLR
10.0000 mg | Freq: Once | INTRAVENOUS | Status: AC
  Administered 2019-08-31: 15:00:00 10 mg via INTRAVENOUS
  Filled 2019-08-31: qty 10

## 2019-08-31 NOTE — Progress Notes (Signed)
Spoke on the phone with patient's wife and updated her to patient's current condition and plan of care. She had no further questions at this time.

## 2019-08-31 NOTE — Progress Notes (Signed)
Pt's head tuned and arms rotated. ETT secured.

## 2019-08-31 NOTE — Progress Notes (Signed)
Pt supined without complications.  ETT is secure

## 2019-08-31 NOTE — Progress Notes (Signed)
Patient placed in prone position. Arms placed in swimmers position.  ETT secured 24 cm @ lip with cloth tape. RT x2, RN x 4 at bedside. Tolerated well with no complications.

## 2019-08-31 NOTE — Progress Notes (Signed)
Patient's head turned and arms rotated.   

## 2019-08-31 NOTE — Progress Notes (Signed)
NAME:  [name] [name], MRN:  [id number], DOB:  Jul 03, 1956, LOS: 11 ADMISSION DATE:  07/30/2019, CONSULTATION DATE: 08/21/2019 REFERRING MD: Dr. Randol Kern, CHIEF COMPLAINT: Hypoxemic respiratory failure  Brief History   64 year old man with CAD, hypertension, diabetes, OSA.  Admitted with COVID-19 pneumonia and hypoxemic respiratory failure 12/30.  Past Medical History   has a past medical history of Chest pain, Coronary artery disease, Diabetes mellitus without complication ([facility]), Dyspnea, Hyperlipidemia, Hypertension, Right bundle branch block, and Sleep apnea.   Significant Hospital Events   Admit 12/30 To ICU on 1.00 NRB, HHFNC.  Decadron started.  Remdesivir started,Tocilizumab x1, 12/30  12/31 empiric abx started 1/2 PCCM consulted. Intubated.  1/3 requiring neuromuscular blockade, PEEP and FiO2 improving.  Completed remdesivir 1/4: Started on prone positioning protocol given PDF less than 150.  Recultured for fever spike developed narrow complex tachycardia with a rate in the 160s to 170s, was given adenosine which identified underlying atrial flutter, loaded with amiodarone and infusion initiated 1/5: Ongoing fever, empiric nosocomial coverage initiated, in spite of this making improvement and FiO2 and PEEP requirements 1/6: Still spiking fevers, did have hemoglobin drop of almost 2 g, checking lower extremity ultrasounds to rule out venous thrombosis 1/7:still having fever [place] LEs still pending. Failed SBT (no spont effort), during the afternoon hours developed worsening hypoxia in case of dyssynchrony, IV fentanyl added back 1/8: Continues to require more oxygen & peep still having trouble with ventilator synchrony changing sedation regimen to Dilaudid and propofol Consults:  PCCM 12/31  Procedures:  ET tube 1/2 >>  Right IJ CVC 1/2 >>   Significant Diagnostic Tests:  Lower extremity ultrasound 1/6: Negative Micro Data:  SARS CoV 2 12/27 >> positive MRSA screen 12/31 >>  negative Blood 12/30 >> Respiratory culture 1/4>>> Urine culture 1/5>> Antimicrobials:  Remdesivir 12/30 >> 1/3 Tocilizumab x1, 12/30 Azithromycin 12/31 >> 1/4 Ceftriaxone 12/31 >> 1/4 Cefepime 1/5>>>  Vancomycin 1/5>>> Interim history/subjective:  Looks worse today Objective   Blood pressure (!) 158/61, pulse 83, temperature 98.8 F (37.1 C), resp. rate (!) 28, height 5\' 10"  (1.778 m), weight 86.4 kg, SpO2 99 %. CVP:  [3 mmHg-35 mmHg] 8 mmHg  Vent Mode: PRVC FiO2 (%):  [70 %-80 %] 70 % Set Rate:  [30 bmp] 30 bmp Vt Set:  [500 mL] 500 mL PEEP:  [12 cmH20] 12 cmH20 Plateau Pressure:  [25 cmH20-28 cmH20] 25 cmH20   Intake/Output Summary (Last 24 hours) at 08/31/2019 1330 Last data filed at 08/31/2019 0600 Gross per 24 hour  Intake 2268.62 ml  Output 1525 ml  Net 743.62 ml   Filed Weights   08/28/19 0342 08/30/19 0500 08/31/19 0455  Weight: 88.6 kg 88.1 kg 86.4 kg    Examination: General: NAD, sedated HEENT [place]/AT; ETT in place, w/o pressure markings.  Pulmonary coarse scattered rhonchi  Ventilator assessment: FiO2: 100% PEEP: 14 Plateau pressure: 28 CV: RRR, no r/m/g Abdomen:SND Extremities: warm, dry, trace edema, cap refill <2 sec Neuro heavily sedated    Resolved Hospital Problem list   Treated empirically for CAP antibiotics discontinued 1/5 Atrial fibrillation/flutter, this continued for 48 hours and resolved on 1/6 Assessment & Plan:   Acute hypoxemic respiratory failure due to COVID-19 pneumonia/pneumonitis Ruled out HCAP 1/5 No significant fever spike, marginal increase in white blood cell count PLAN: --Continue low tidal volume ventilation at 6 mL/kg predicted body weight  --ABG; Continue to adjust PEEP/FiO2 for target PaO2 55 to 65%, hoping we can begin to wean FiO2 and PEEP  again once ventilator mechanics and synchrony better. Plateau pressure goal less than 30, driving pressure goal less than 15 --periodic paralysis for better vent mechanics as  needed --diuresis --Day #6 empiric cefepime and vancomycin, planning to treat for 7 days --Day #10 of 10 Decadron Sedation protocol, RASS goal -4 Changing Precedex to propofol and fentanyl to Dilaudid Continue clonazepam Continue at bedtime melatonin VAP bundle  F/E/N Plan:  --monitor electrolytes, LFTs, replete/treat as necessary --tube feeds --free water    ID: Fever on 1/4: Cultures obtained.  No spike in white blood cell count, spiked again on 1/5 fevers persist, no culture data to explain this as of yet, no significant fever last night Plan Follow cultures   Mild AKI, I suspect secondary to diuretics, as improved with holding Lasix, now normalized Plan --follow A.m. BUN/Cr --Renal dose medications  DM 2 with hyperglycemia Plan --Continue Levemir, NovoLog, sliding scale  --Anticipate this will improve once Decadron stopped  Hx HTN Plan Continue to hold antihypertensives  CAD Plan Continue aspirin and statin  Toxic metabolic encephalopathy, need for sedation to facilitate mechanical ventilation and h/o depression Plan Continue Lexapro and Wellbutrin Sedation as mentioned above    Best practice:  Diet: tubefeeds 1/4 Pain/Anxiety/Delirium protocol (if indicated): PAD versed, fentanyl VAP protocol (if indicated): N/A DVT prophylaxis:enoxaparin GI prophylaxis: Pepcid Glucose control: Sliding scale insulin, Levemir Mobility: Bedrest Code Status: Full Family Communication: Discussed with wife on 1/3 Disposition: ICU; critically ill. Headed the right direction.    I have independently seen and examined the patient, reviewed data, and developed an assessment and plan. A total of 38 minutes were spent in critical care assessment and medical decision making. This critical care time does not reflect procedure time, or teaching time or supervisory time of PA/NP/Med student/Med Resident, etc but could involve care discussion time.   Bonna Gains, MD  PhD 08/31/19 1:43 PM

## 2019-09-01 LAB — POCT I-STAT 7, (LYTES, BLD GAS, ICA,H+H)
Acid-Base Excess: 6 mmol/L — ABNORMAL HIGH (ref 0.0–2.0)
Bicarbonate: 33.9 mmol/L — ABNORMAL HIGH (ref 20.0–28.0)
Calcium, Ion: 1.28 mmol/L (ref 1.15–1.40)
HCT: 38 % — ABNORMAL LOW (ref 39.0–52.0)
Hemoglobin: 12.9 g/dL — ABNORMAL LOW (ref 13.0–17.0)
O2 Saturation: 92 %
Patient temperature: 99.5
Potassium: 5 mmol/L (ref 3.5–5.1)
Sodium: 147 mmol/L — ABNORMAL HIGH (ref 135–145)
TCO2: 36 mmol/L — ABNORMAL HIGH (ref 22–32)
pCO2 arterial: 68.8 mmHg (ref 32.0–48.0)
pH, Arterial: 7.303 — ABNORMAL LOW (ref 7.350–7.450)
pO2, Arterial: 76 mmHg — ABNORMAL LOW (ref 83.0–108.0)

## 2019-09-01 LAB — BASIC METABOLIC PANEL
Anion gap: 6 (ref 5–15)
BUN: 61 mg/dL — ABNORMAL HIGH (ref 8–23)
CO2: 33 mmol/L — ABNORMAL HIGH (ref 22–32)
Calcium: 8.4 mg/dL — ABNORMAL LOW (ref 8.9–10.3)
Chloride: 108 mmol/L (ref 98–111)
Creatinine, Ser: 0.82 mg/dL (ref 0.61–1.24)
GFR calc Af Amer: >60 mL/min (ref >60)
GFR calc non Af Amer: >60 mL/min (ref >60)
Glucose, Bld: 221 mg/dL — ABNORMAL HIGH (ref 70–99)
Potassium: 5.1 mmol/L (ref 3.5–5.1)
Sodium: 147 mmol/L — ABNORMAL HIGH (ref 135–145)

## 2019-09-01 LAB — GLUCOSE, CAPILLARY
Glucose-Capillary: 166 mg/dL — ABNORMAL HIGH (ref 70–99)
Glucose-Capillary: 233 mg/dL — ABNORMAL HIGH (ref 70–99)
Glucose-Capillary: 176 mg/dL — ABNORMAL HIGH (ref 70–99)
Glucose-Capillary: 169 mg/dL — ABNORMAL HIGH (ref 70–99)
Glucose-Capillary: 188 mg/dL — ABNORMAL HIGH (ref 70–99)
Glucose-Capillary: 212 mg/dL — ABNORMAL HIGH (ref 70–99)

## 2019-09-01 LAB — CBC
HCT: 40.3 % (ref 39.0–52.0)
Hemoglobin: 12.1 g/dL — ABNORMAL LOW (ref 13.0–17.0)
MCH: 31.3 pg (ref 26.0–34.0)
MCHC: 30 g/dL (ref 30.0–36.0)
MCV: 104.4 fL — ABNORMAL HIGH (ref 80.0–100.0)
Platelets: 198 10*3/uL (ref 150–400)
RBC: 3.86 MIL/uL — ABNORMAL LOW (ref 4.22–5.81)
RDW: 15.2 % (ref 11.5–15.5)
WBC: 14.1 10*3/uL — ABNORMAL HIGH (ref 4.0–10.5)
nRBC: 0 % (ref 0.0–0.2)

## 2019-09-01 LAB — TRIGLYCERIDES: Triglycerides: 232 mg/dL — ABNORMAL HIGH (ref <150)

## 2019-09-01 MED ORDER — BISACODYL 10 MG RE SUPP
10.0000 mg | Freq: Every day | RECTAL | Status: DC | PRN
  Filled 2019-09-01: qty 1

## 2019-09-01 MED ORDER — JUVEN PO PACK
1.0000 | PACK | Freq: Two times a day (BID) | ORAL | Status: DC
  Administered 2019-09-01 – 2019-09-12 (×22): 1
  Filled 2019-09-01 (×22): qty 1

## 2019-09-01 MED ORDER — INSULIN ASPART 100 UNIT/ML ~~LOC~~ SOLN
10.0000 [IU] | SUBCUTANEOUS | Status: DC
  Administered 2019-09-01 – 2019-09-13 (×64): 10 [IU] via SUBCUTANEOUS

## 2019-09-01 NOTE — Progress Notes (Signed)
Pt's head turned and arms rotated with no complications. ETT secured °

## 2019-09-01 NOTE — Progress Notes (Signed)
Pt placed supine at this time with no complications. ETT remains secure in proper position with cloth tape.  

## 2019-09-01 NOTE — Progress Notes (Signed)
NAME:  [name] [name], MRN:  [id number], DOB:  01/07/1956, LOS: 60 ADMISSION DATE:  08/21/19, CONSULTATION DATE: 08/21/2019 REFERRING MD: Dr. Waldron Labs, CHIEF COMPLAINT: Hypoxemic respiratory failure  Brief History   64 year old man with CAD, hypertension, diabetes, OSA.  Admitted with COVID-19 pneumonia and hypoxemic respiratory failure 12/30.  Past Medical History  CAD - s/p CABG RBBB HTN HLD DM   Significant Hospital Events   12/30 Admit to ICU on NRB, HHFNC.  Decadron started.  Remdesivir started,Tocilizumab x1  12/31 Empiric abx started 1/02 PCCM consulted. Intubated.  1/03 Requiring NMB, PEEP and FiO2 improving.  Completed remdesivir 1/04 Prone positioning protocol given PF <150.  Recultured for fever. Narrow tachy, given adenosine which identified underlying atrial flutter, loaded with amiodarone and infusion initiated 1/05 Ongoing fever, empiric nosocomial coverage initiated, in spite of this making improvement and FiO2 and PEEP requirements 1/06 Still spiking fevers, did have hemoglobin drop of almost 2 g, checking lower extremity ultrasounds to rule out venous thrombosis 1/07 Still having fever [place] LEs still pending. Failed SBT (no spont effort), during the afternoon hours developed worsening hypoxia in case of dyssynchrony, IV fentanyl added back 1/08 Continues to require more oxygen & peep still having trouble with ventilator synchrony changing sedation regimen to Dilaudid and propofol  Consults:  PCCM 12/31  Procedures:  ETT 1/2 >>  Right IJ CVC 1/2 >>   Significant Diagnostic Tests:  Lower extremity ultrasound 1/6 >> negative  Micro Data:  SARS CoV 2 12/27 >> positive MRSA screen 12/31 >> negative Blood 12/30 >> negative  BCx2 1/4 >> negative  Respiratory culture 1/4 >> few candida albicans Urine culture 1/5 >> negative   Antimicrobials:  Remdesivir 12/30 >> 1/3 Tocilizumab x1, 12/30 Azithromycin 12/31 >> 1/4 Ceftriaxone 12/31 >> 1/4 Cefepime 1/5 >>  1/9 Vancomycin 1/5 >> 1/9  Interim history/subjective:  Tmax 100 I/O - 2.7L UOP, +121ml in last 24 hours Glucose range - 150-233  PEEP 12 / FiO2 50%, Peak airway pressure 26, PPlat 24 No acute events overnight   Objective   Blood pressure (!) 150/59, pulse (!) 107, temperature 100 F (37.8 C), resp. rate (!) 21, height 5\' 10"  (1.778 m), weight 86.4 kg, SpO2 91 %. CVP:  [5 mmHg] 5 mmHg  Vent Mode: PRVC FiO2 (%):  [50 %-60 %] 50 % Set Rate:  [30 bmp] 30 bmp Vt Set:  [430 mL-500 mL] 430 mL PEEP:  [10 JGG83-66 cmH20] 10 cmH20 Plateau Pressure:  [24 cmH20-27 cmH20] 24 cmH20   Intake/Output Summary (Last 24 hours) at 09/01/2019 1719 Last data filed at 09/01/2019 1600 Gross per 24 hour  Intake 2818.25 ml  Output 2855 ml  Net -36.75 ml   Filed Weights   08/28/19 0342 08/30/19 0500 08/31/19 0455  Weight: 88.6 kg 88.1 kg 86.4 kg    Examination: General: adult male lying in bed in NAD, prone, critically ill appearing  HEENT: MM pink/moist, ETT Neuro: sedate  CV: s1s2 RRR, no m/r/g PULM:  Non-labored/synchronous, lungs bilaterally coarse GI: soft, bsx4 active  Extremities: warm/dry, trace edema  Skin: no rashes or lesions  Resolved Hospital Problem list   Treated empirically for CAP antibiotics discontinued 1/5 Atrial fibrillation/flutter, this continued for 48 hours and resolved on 1/6  Assessment & Plan:   Acute Hypoxemic Respiratory Failure due to COVID-19 PNA / Pneumonitis Ruled out HCAP 1/5. Completed remdesivir, decadron.  -low Vt ventilation 4-8cc/kg -goal plateau pressure <30, driving pressure <29 cm H2O -target PaO2 55-65, titrate PEEP/FiO2 per ARDS  protocol  -if P/F ratio <150, prone therapy for 16 hours per day -goal CVP <4, diuresis as necessary -VAP prevention measures  -follow intermittent CXR   Sedation Need in setting of Mechanical Ventilation  Toxic Metabolic Encephalopathy  Hx Depression  -PAD Protocol with dilaudid, propofol  -bowel regimen >  continue miralax, senna, add dulcolax  -RASS Goal -2 to -3  -continue clonazepam PT -QHS melatonin  -continue lexapro, wellbutrin  Intermittent Fever  Noted intermittent fevers from 1-4-1/9.  No change in WBC.  LE dopplers negative.  Treated with empiric abx.  -monitor fever curve   Mild AKI  -Suspect in setting of active diuresis as improved with holding  -Trend BMP / urinary output -free water 300 ml Q4 -Replace electrolytes as indicated -Avoid nephrotoxic agents, ensure adequate renal perfusion  DM II with Hyperglycemia -continue levemir 28 units QD -SSI, moderate scale  -increase TF coverage to 10 units Q4  Hx HTN -hold home antihypertensives  -ICU monitoring   CAD -ASA, statin    Best practice:  Diet: TF Pain/Anxiety/Delirium protocol (if indicated): PAD protocol  VAP protocol (if indicated): N/A DVT prophylaxis:enoxaparin GI prophylaxis: Pepcid Glucose control: SSI, Levemir Mobility: BR Code Status: Full Family Communication: Wife updated via phone 1/11.  Disposition: ICU .    Critical Care Time: 32 minutes   Canary Brim, MSN, NP-C [facility] 09/01/2019, 5:41 PM   Please see [email or web address] for pager details.

## 2019-09-01 NOTE — Progress Notes (Signed)
Updated wife via phoine

## 2019-09-01 NOTE — Progress Notes (Signed)
 Nutrition Follow-up  DOCUMENTATION CODES:   Not applicable  INTERVENTION:   Tube Feeding: Vital 1.5 at 40 ml/hr Pro-Stat 60 mL QID Provides 2240 kcals, 185 g of protein and 730 mL of free water  TF regimen and propofol at current rate providing 2797 total kcal/day   Add Juven BID, each packet provides 80 calories, 8 grams of carbohydrate, 2.5  grams of protein (collagen), 7 grams of L-arginine and 7 grams of L-glutamine; supplement contains CaHMB, Vitamins C, E, B12 and Zinc to promote wound healing  NUTRITION DIAGNOSIS:   Inadequate oral intake related to acute illness, catabolic illness as evidenced by NPO status.  Being addressed via TF   GOAL:   Patient will meet greater than or equal to 90% of their needs  Progressing  MONITOR:   Vent status, Labs, Weight trends, TF tolerance, Skin  REASON FOR ASSESSMENT:   Consult, Ventilator Enteral/tube feeding initiation and management  ASSESSMENT:   64 yo male admitted with acute respiratory failure due to COVID-19 pneumonia requiring intubation. PMH includes DM, HTN, CAD, depression   Prone postioning ongoing Patient is currently intubated on ventilator support, sedated on propofol and dilaudid MV: 14.3 L/min Temp (24hrs), Avg:100.1 F (37.8 C), Min:98.4 F (36.9 C), Max:100.9 F (38.3 C)  Propofol: 21.1 ml/hr  Vital 1.5 at 40 ml/hr, Pro-Stat 60 mL QID Noted free water 300 mL q 4 hours   Labs: CBGs 169-286, sodium 146 (H), TG 232 Meds: decadron, ss novolog, novolog q 4 hours, levemir, MVI  Diet Order:   Diet Order    None      EDUCATION NEEDS:   Not appropriate for education at this time  Skin:  Skin Assessment: Skin Integrity Issues: Skin Integrity Issues:: DTI DTI: bilateral coccyx Other: MASD: coccyx, scrotum  Last BM:  1/11  Height:   Ht Readings from Last 1 Encounters:  08/23/19 5\' 10"  (1.778 m)    Weight:   Wt Readings from Last 1 Encounters:  08/31/19 86.4 kg    BMI:  Body mass  index is 27.33 kg/m.  Estimated Nutritional Needs:   Kcal:  10/29/19 kcals  Protein:  145-170 g  Fluid:  >/= 2 L    Cameron Katayama MS, RDN, LDN, CNSC [phone] Pager  [phone] Weekend/On-Call Pager

## 2019-09-02 ENCOUNTER — Inpatient Hospital Stay: Payer: Self-pay

## 2019-09-02 ENCOUNTER — Inpatient Hospital Stay (HOSPITAL_COMMUNITY): Payer: BC Managed Care – PPO

## 2019-09-02 DIAGNOSIS — R509 Fever, unspecified: Secondary | ICD-10-CM

## 2019-09-02 LAB — GLUCOSE, CAPILLARY
Glucose-Capillary: 122 mg/dL — ABNORMAL HIGH (ref 70–99)
Glucose-Capillary: 137 mg/dL — ABNORMAL HIGH (ref 70–99)
Glucose-Capillary: 192 mg/dL — ABNORMAL HIGH (ref 70–99)
Glucose-Capillary: 192 mg/dL — ABNORMAL HIGH (ref 70–99)
Glucose-Capillary: 194 mg/dL — ABNORMAL HIGH (ref 70–99)
Glucose-Capillary: 246 mg/dL — ABNORMAL HIGH (ref 70–99)
Glucose-Capillary: 92 mg/dL (ref 70–99)

## 2019-09-02 LAB — BASIC METABOLIC PANEL
Anion gap: 10 (ref 5–15)
BUN: 65 mg/dL — ABNORMAL HIGH (ref 8–23)
CO2: 35 mmol/L — ABNORMAL HIGH (ref 22–32)
Calcium: 8.8 mg/dL — ABNORMAL LOW (ref 8.9–10.3)
Chloride: 105 mmol/L (ref 98–111)
Creatinine, Ser: 0.83 mg/dL (ref 0.61–1.24)
GFR calc Af Amer: >60 mL/min (ref >60)
GFR calc non Af Amer: >60 mL/min (ref >60)
Glucose, Bld: 190 mg/dL — ABNORMAL HIGH (ref 70–99)
Potassium: 4.4 mmol/L (ref 3.5–5.1)
Sodium: 150 mmol/L — ABNORMAL HIGH (ref 135–145)

## 2019-09-02 LAB — CBC
HCT: 38.9 % — ABNORMAL LOW (ref 39.0–52.0)
Hemoglobin: 11.4 g/dL — ABNORMAL LOW (ref 13.0–17.0)
MCH: 31.1 pg (ref 26.0–34.0)
MCHC: 29.3 g/dL — ABNORMAL LOW (ref 30.0–36.0)
MCV: 106 fL — ABNORMAL HIGH (ref 80.0–100.0)
Platelets: 194 10*3/uL (ref 150–400)
RBC: 3.67 MIL/uL — ABNORMAL LOW (ref 4.22–5.81)
RDW: 15.3 % (ref 11.5–15.5)
WBC: 8.5 10*3/uL (ref 4.0–10.5)
nRBC: 0 % (ref 0.0–0.2)

## 2019-09-02 LAB — TRIGLYCERIDES: Triglycerides: 197 mg/dL — ABNORMAL HIGH (ref <150)

## 2019-09-02 MED ORDER — MIDAZOLAM 50MG/50ML (1MG/ML) PREMIX INFUSION
0.0000 mg/h | INTRAVENOUS | Status: DC
  Administered 2019-09-02 – 2019-09-03 (×3): 4 mg/h via INTRAVENOUS
  Administered 2019-09-04: 1 mg/h via INTRAVENOUS
  Administered 2019-09-04: 4 mg/h via INTRAVENOUS
  Administered 2019-09-05: 3 mg/h via INTRAVENOUS
  Administered 2019-09-06 (×2): 6 mg/h via INTRAVENOUS
  Administered 2019-09-07 (×2): 2 mg/h via INTRAVENOUS
  Administered 2019-09-08 (×2): 4 mg/h via INTRAVENOUS
  Administered 2019-09-09: 7 mg/h via INTRAVENOUS
  Filled 2019-09-02 (×13): qty 50

## 2019-09-02 MED ORDER — SODIUM CHLORIDE 0.9% FLUSH: 10.0000 mL | INTRAVENOUS | Status: DC | PRN

## 2019-09-02 MED ORDER — MIDAZOLAM 50MG/50ML (1MG/ML) PREMIX INFUSION: 0.0000 mg/h | INTRAVENOUS | Status: DC

## 2019-09-02 MED ORDER — OXYCODONE HCL 5 MG PO TABS
5.0000 mg | ORAL_TABLET | Freq: Four times a day (QID) | ORAL | Status: DC
  Administered 2019-09-02 – 2019-09-09 (×26): 5 mg
  Filled 2019-09-02 (×27): qty 1

## 2019-09-02 MED ORDER — FUROSEMIDE 10 MG/ML IJ SOLN
40.0000 mg | Freq: Once | INTRAMUSCULAR | Status: AC
  Administered 2019-09-02: 16:00:00 40 mg via INTRAVENOUS
  Filled 2019-09-02: qty 4

## 2019-09-02 MED ORDER — INSULIN DETEMIR 100 UNIT/ML ~~LOC~~ SOLN
20.0000 [IU] | Freq: Every day | SUBCUTANEOUS | Status: DC
  Administered 2019-09-03: 20 [IU] via SUBCUTANEOUS
  Filled 2019-09-02: qty 0.2

## 2019-09-02 MED ORDER — MIDAZOLAM BOLUS VIA INFUSION: 1.0000 mg | INTRAVENOUS | Status: DC | PRN

## 2019-09-02 MED ORDER — SODIUM CHLORIDE 0.9% FLUSH
10.0000 mL | Freq: Two times a day (BID) | INTRAVENOUS | Status: DC
  Administered 2019-09-03: 20 mL
  Administered 2019-09-03 – 2019-09-04 (×3): 10 mL
  Administered 2019-09-05: 20 mL
  Administered 2019-09-05 – 2019-09-06 (×2): 10 mL
  Administered 2019-09-06: 20 mL
  Administered 2019-09-07 – 2019-09-09 (×6): 10 mL
  Administered 2019-09-10: 20 mL
  Administered 2019-09-10 – 2019-09-12 (×5): 10 mL

## 2019-09-02 MED ORDER — MIDAZOLAM BOLUS VIA INFUSION
1.0000 mg | INTRAVENOUS | Status: DC | PRN
  Administered 2019-09-05: 1 mg via INTRAVENOUS
  Administered 2019-09-07: 21:00:00 2 mg via INTRAVENOUS
  Administered 2019-09-07 (×2): 1 mg via INTRAVENOUS
  Filled 2019-09-02: qty 2

## 2019-09-02 NOTE — Progress Notes (Addendum)
NAME:  [name] [name], MRN:  [id number], DOB:  May 14, 1956, LOS: 13 ADMISSION DATE:  07/24/2019, CONSULTATION DATE: 08/21/2019 REFERRING MD: Dr. Randol Kern, CHIEF COMPLAINT: Hypoxemic respiratory failure  Brief History   64 year old man with CAD, hypertension, diabetes, OSA.  Admitted with COVID-19 pneumonia and hypoxemic respiratory failure 12/30.  Past Medical History  CAD - s/p CABG RBBB HTN HLD DM   Significant Hospital Events   12/30 Admit to ICU on NRB, HHFNC.  Decadron started.  Remdesivir started,Tocilizumab x1  12/31 Empiric abx started 1/02 PCCM consulted. Intubated.  1/03 Requiring NMB, PEEP and FiO2 improving.  Completed remdesivir 1/04 Prone positioning protocol given PF <150.  Recultured for fever. Narrow tachy, given adenosine which identified underlying atrial flutter, loaded with amiodarone and infusion initiated 1/05 Ongoing fever, empiric nosocomial coverage initiated, in spite of this making improvement and FiO2 and PEEP requirements 1/06 Still spiking fevers, did have hemoglobin drop of almost 2 g, checking lower extremity ultrasounds to rule out venous thrombosis 1/07 Still having fever [place] LEs still pending. Failed SBT (no spont effort), during the afternoon hours developed worsening hypoxia in case of dyssynchrony, IV fentanyl added back 1/08 Continues to require more oxygen & peep still having trouble with ventilator synchrony changing sedation regimen to Dilaudid and propofol 1/12 Ongoing intermittent fevers, LE duplex negative. Orders for line change.   Consults:  PCCM 12/31  Procedures:  ETT 1/2 >>  Right IJ CVC 1/2 >>   Significant Diagnostic Tests:  Lower extremity ultrasound 1/6 >> negative  Micro Data:  SARS CoV 2 12/27 >> positive MRSA screen 12/31 >> negative Blood 12/30 >> negative  BCx2 1/4 >> negative  Respiratory culture 1/4 >> few candida albicans Urine culture 1/5 >> negative   Antimicrobials:  Remdesivir 12/30 >> 1/3 Tocilizumab x1,  12/30 Azithromycin 12/31 >> 1/4 Ceftriaxone 12/31 >> 1/4 Cefepime 1/5 >> 1/9 Vancomycin 1/5 >> 1/9  Interim history/subjective:  Tmax 102.4 I/O - 3.6L UOP, net 2.2L negative in 24h Glucose 160-230  PEEP 10 / FiO2 50%, Peak 27, Pplat 26 Last BM 3 days ago  Objective   Blood pressure (!) 127/52, pulse (!) 115, temperature (!) 102.1 F (38.9 C), temperature source Rectal, resp. rate (!) 31, height 5\' 10"  (1.778 m), weight 86.4 kg, SpO2 (!) 86 %.    Vent Mode: PRVC FiO2 (%):  [50 %] 50 % Set Rate:  [30 bmp] 30 bmp Vt Set:  [430 mL] 430 mL PEEP:  [10 cmH20] 10 cmH20 Plateau Pressure:  [17 cmH20-24 cmH20] 17 cmH20   Intake/Output Summary (Last 24 hours) at 09/02/2019 1239 Last data filed at 09/02/2019 1200 Gross per 24 hour  Intake 1513.82 ml  Output 3545 ml  Net -2031.18 ml   Filed Weights   08/28/19 0342 08/30/19 0500 08/31/19 0455  Weight: 88.6 kg 88.1 kg 86.4 kg    Examination: General: adult male lying in bed, critically ill appearing in NAD HEENT: MM pink/moist, ETT Neuro: sedate  CV: s1s2 RRR, no m/r/g PULM:  Non-labored on vent, lungs bilaterally coarse GI: soft, bsx4 active  Extremities: warm/dry, trace generalized edema  Skin: no rashes or lesions  Resolved Hospital Problem list   Treated empirically for CAP antibiotics discontinued 1/5 Atrial fibrillation/flutter, this continued for 48 hours and resolved on 1/6  Assessment & Plan:   Acute Hypoxemic Respiratory Failure due to COVID-19 PNA / Pneumonitis Ruled out HCAP 1/5. Completed remdesivir, decadron.  -low Vt ventilation 4-8cc/kg -goal plateau pressure <30, driving pressure 10/29/19 cm <51 -  target PaO2 55-65, titrate PEEP/FiO2 per ARDS protocol  -PF ratio does not warrant prone at this time, hold for now -goal CVP <4, diuresis as necessary -VAP prevention measures  -follow intermittent CXR  -advance ETT 2cm   Sedation Need in setting of Mechanical Ventilation  Toxic Metabolic Encephalopathy  Hx  Depression  -PAD protocol with dilaudid, change propofol to versed -continue bowel regimen  -clonazepam PT  -QHS melatonin  -continue lexapro, wellbutrin   Intermittent Fever  Noted intermittent fevers from 1-4.  No change in WBC.  LE dopplers negative.  Treated with empiric abx. Last BM 11/9.  -follow fever curve  -PIV to be established and then remove central line for line holiday. Plan for PICC 1/14 if fever curve improved.   Mild AKI  Hypernatremia  Suspect in setting of active diuresis as improved with holding 1/10, 1/11 -continue free water -lasix 40 mg IVx1 > note prior AKI  -Trend BMP / urinary output -Replace electrolytes as indicated -Avoid nephrotoxic agents, ensure adequate renal perfusion  DM II with Hyperglycemia -continue levemir, SSI with moderate scale -TF coverage Q4   Hx HTN CAD -hold home antihypertensives -continue ASA, statin    Best practice:  Diet: TF Pain/Anxiety/Delirium protocol (if indicated): PAD protocol  VAP protocol (if indicated): in place DVT prophylaxis: enoxaparin GI prophylaxis: Pepcid Glucose control: SSI, Levemir Mobility: BR Code Status: Full Code  Family Communication: Wife Benjamine Mola) updated via phone 1/12  Disposition: ICU .    Critical Care Time: 78 minutes   Noe Gens, MSN, NP-C [facility] 09/02/2019, 12:39 PM   Please see [email or web address] for pager details.

## 2019-09-02 NOTE — Progress Notes (Signed)
Spouse called and provided with update at this time. She appreciated update.

## 2019-09-02 NOTE — Plan of Care (Signed)
  Problem: Coping: Goal: Level of anxiety will decrease Outcome: Progressing  No signs of anxiety during this shift. Pt appears to be calm and comfortable. Problem: Pain Managment: Goal: General experience of comfort will improve Outcome: Progressing  Pt CPOT of 0 throughout shift. No signs of discomfort. Transitioned from propofol to versed and dilaudid. Added oxy as well.

## 2019-09-02 NOTE — Progress Notes (Signed)
PT Cancellation Note  Patient Details Name: [name] [name] MRN: [id number] DOB: 1956/02/06   Cancelled Treatment:    Reason Eval/Treat Not Completed: Medical issues which prohibited therapy  Remains on vent and sedated. PT will follow for readiness for PT.    [name] 09/02/2019, 6:52 AM Blanchard Kelch PT Acute Rehabilitation Services Pager [phone] Office [phone]

## 2019-09-03 ENCOUNTER — Inpatient Hospital Stay (HOSPITAL_COMMUNITY): Payer: BC Managed Care – PPO

## 2019-09-03 LAB — BASIC METABOLIC PANEL
Anion gap: 7 (ref 5–15)
BUN: 79 mg/dL — ABNORMAL HIGH (ref 8–23)
CO2: 36 mmol/L — ABNORMAL HIGH (ref 22–32)
Calcium: 8.6 mg/dL — ABNORMAL LOW (ref 8.9–10.3)
Chloride: 106 mmol/L (ref 98–111)
Creatinine, Ser: 0.9 mg/dL (ref 0.61–1.24)
GFR calc Af Amer: >60 mL/min (ref >60)
GFR calc non Af Amer: >60 mL/min (ref >60)
Glucose, Bld: 173 mg/dL — ABNORMAL HIGH (ref 70–99)
Potassium: 4.3 mmol/L (ref 3.5–5.1)
Sodium: 149 mmol/L — ABNORMAL HIGH (ref 135–145)

## 2019-09-03 LAB — CBC
HCT: 41.7 % (ref 39.0–52.0)
Hemoglobin: 12.5 g/dL — ABNORMAL LOW (ref 13.0–17.0)
MCH: 31.7 pg (ref 26.0–34.0)
MCHC: 30 g/dL (ref 30.0–36.0)
MCV: 105.8 fL — ABNORMAL HIGH (ref 80.0–100.0)
Platelets: 179 10*3/uL (ref 150–400)
RBC: 3.94 MIL/uL — ABNORMAL LOW (ref 4.22–5.81)
RDW: 15.7 % — ABNORMAL HIGH (ref 11.5–15.5)
WBC: 14.7 10*3/uL — ABNORMAL HIGH (ref 4.0–10.5)
nRBC: 0.2 % (ref 0.0–0.2)

## 2019-09-03 LAB — GLUCOSE, CAPILLARY
Glucose-Capillary: 128 mg/dL — ABNORMAL HIGH (ref 70–99)
Glucose-Capillary: 166 mg/dL — ABNORMAL HIGH (ref 70–99)
Glucose-Capillary: 172 mg/dL — ABNORMAL HIGH (ref 70–99)
Glucose-Capillary: 194 mg/dL — ABNORMAL HIGH (ref 70–99)
Glucose-Capillary: 199 mg/dL — ABNORMAL HIGH (ref 70–99)
Glucose-Capillary: 237 mg/dL — ABNORMAL HIGH (ref 70–99)

## 2019-09-03 MED ORDER — DOXAZOSIN MESYLATE 1 MG PO TABS
1.0000 mg | ORAL_TABLET | Freq: Every day | ORAL | Status: DC
  Administered 2019-09-03 – 2019-09-12 (×10): 1 mg
  Filled 2019-09-03 (×11): qty 1

## 2019-09-03 MED ORDER — LACTULOSE 10 GM/15ML PO SOLN
30.0000 g | Freq: Once | ORAL | Status: AC
  Administered 2019-09-03: 14:00:00 30 g
  Filled 2019-09-03: qty 45

## 2019-09-03 MED ORDER — PHENYLEPHRINE HCL-NACL 10-0.9 MG/250ML-% IV SOLN
0.0000 ug/min | INTRAVENOUS | Status: DC
  Administered 2019-09-03: 20 ug/min via INTRAVENOUS
  Administered 2019-09-04: 10 ug/min via INTRAVENOUS
  Administered 2019-09-04: 30 ug/min via INTRAVENOUS
  Administered 2019-09-05 (×2): 20 ug/min via INTRAVENOUS
  Administered 2019-09-06: 200 ug/min via INTRAVENOUS
  Administered 2019-09-06: 40 ug/min via INTRAVENOUS
  Administered 2019-09-06: 60 ug/min via INTRAVENOUS
  Administered 2019-09-06: 50 ug/min via INTRAVENOUS
  Administered 2019-09-06: 200 ug/min via INTRAVENOUS
  Administered 2019-09-06: 60 ug/min via INTRAVENOUS
  Administered 2019-09-06: 40 ug/min via INTRAVENOUS
  Administered 2019-09-06 (×2): 60 ug/min via INTRAVENOUS
  Administered 2019-09-06: 400 ug/min via INTRAVENOUS
  Administered 2019-09-07: 20 ug/min via INTRAVENOUS
  Administered 2019-09-07: 40 ug/min via INTRAVENOUS
  Administered 2019-09-07: 50 ug/min via INTRAVENOUS
  Administered 2019-09-08: 30 ug/min via INTRAVENOUS
  Filled 2019-09-03 (×13): qty 250
  Filled 2019-09-03 (×2): qty 500
  Filled 2019-09-03 (×2): qty 250

## 2019-09-03 MED ORDER — INSULIN DETEMIR 100 UNIT/ML ~~LOC~~ SOLN
22.0000 [IU] | Freq: Every day | SUBCUTANEOUS | Status: DC
  Administered 2019-09-04: 22 [IU] via SUBCUTANEOUS
  Filled 2019-09-03: qty 0.22

## 2019-09-03 NOTE — Progress Notes (Signed)
Spoke with patient's wife and provided full update/answered all questions.  

## 2019-09-03 NOTE — Progress Notes (Signed)
eLink Physician-Brief Progress Note Patient Name: [name] [name] DOB: 22-Mar-1956 MRN: [id number]   Date of Service  09/03/2019  HPI/Events of Note  RN called regarding persistent hypotension SBP 80's.  Febrile today to 101, now improved with tylenol. RN attempted decreased sedation but pt became dyssynchronous with vent. Attempting diuresis. Adequate UOP. Line holiday r/t fevers - has midline.   eICU Interventions  Neo gtt  Blood cultures  Hold further lasix      Intervention Category Major Interventions: Hypotension - evaluation and management  Danford Bad 09/03/2019, 9:26 PM

## 2019-09-03 NOTE — Procedures (Signed)
Cortrak  Person Inserting Tube:  Juanisha Bautch C, RD Tube Type:  Cortrak - 43 inches Tube Location:  Left nare Initial Placement:  Postpyloric Secured by: Bridle Technique Used to Measure Tube Placement:  Documented cm marking at nare/ corner of mouth Cortrak Secured At:  90 cm    Cortrak Tube Team Note:  Consult received to place a Cortrak feeding tube.   No x-ray is required. RN may begin using tube.   If the tube becomes dislodged please keep the tube and contact the Cortrak team at [email or web address] ([email or web address]) for replacement.  If after hours and replacement cannot be delayed, place a NG tube and confirm placement with an abdominal x-ray.    Colton Engdahl RD, LDN, CNSC [phone] Pager [phone] After Hours Pager   

## 2019-09-03 NOTE — Progress Notes (Signed)
NAME:  [name] [name], MRN:  [id number], DOB:  03-16-1956, LOS: 14 ADMISSION DATE:  08/28/2019, CONSULTATION DATE: 08/21/2019 REFERRING MD: Dr. Randol Kern, CHIEF COMPLAINT: Hypoxemic respiratory failure  Brief History   64 year old man with CAD, hypertension, diabetes, OSA.  Admitted with COVID-19 pneumonia and hypoxemic respiratory failure 12/30.  Past Medical History  CAD - s/p CABG RBBB HTN HLD DM   Significant Hospital Events   12/30 Admit to ICU on NRB, HHFNC.  Decadron started.  Remdesivir started,Tocilizumab x1  12/31 Empiric abx started 1/02 PCCM consulted. Intubated.  1/03 Requiring NMB, PEEP and FiO2 improving.  Completed remdesivir 1/04 Prone positioning protocol given PF <150.  Recultured for fever. Narrow tachy, given adenosine which identified underlying atrial flutter, loaded with amiodarone and infusion initiated 1/05 Ongoing fever, empiric nosocomial coverage initiated, in spite of this making improvement and FiO2 and PEEP requirements 1/06 Still spiking fevers, did have hemoglobin drop of almost 2 g, checking lower extremity ultrasounds to rule out venous thrombosis 1/07 Still having fever [place] LEs still pending. Failed SBT (no spont effort), during the afternoon hours developed worsening hypoxia in case of dyssynchrony, IV fentanyl added back 1/08 Continues to require more oxygen & peep still having trouble with ventilator synchrony changing sedation regimen to Dilaudid and propofol 1/12 Ongoing intermittent fevers, LE duplex negative. Orders for line change.   Consults:  PCCM 12/31  Procedures:  ETT 1/2 >>  Right IJ CVC 1/2 >> 1/12  Significant Diagnostic Tests:  Lower extremity ultrasound 1/6 >> negative  Micro Data:  SARS CoV 2 12/27 >> positive MRSA screen 12/31 >> negative Blood 12/30 >> negative  BCx2 1/4 >> negative  Respiratory culture 1/4 >> few candida albicans Urine culture 1/5 >> negative   Antimicrobials:  Remdesivir 12/30 >> 1/3 Tocilizumab  x1, 12/30 Azithromycin 12/31 >> 1/4 Ceftriaxone 12/31 >> 1/4 Cefepime 1/5 >> 1/9 Vancomycin 1/5 >> 1/9  Interim history/subjective:  Tmax 104.7 > all lines removed 1/12 I/O- 2.4L UOP, net 324+ in 24h Glucose range - 137-221 PEEP 10, FiO2 70%, Peak 27, Pplat 26 No BM yet Has Hiccups  Objective   Blood pressure (!) 96/52, pulse (!) 125, temperature 98 F (36.7 C), temperature source Axillary, resp. rate (!) 21, height 5\' 10"  (1.778 m), weight 87 kg, SpO2 93 %.    Vent Mode: PRVC FiO2 (%):  [70 %-80 %] 70 % Set Rate:  [30 bmp] 30 bmp Vt Set:  [430 mL] 430 mL PEEP:  [10 cmH20] 10 cmH20 Plateau Pressure:  [22 cmH20-25 cmH20] 24 cmH20   Intake/Output Summary (Last 24 hours) at 09/03/2019 1558 Last data filed at 09/03/2019 1400 Gross per 24 hour  Intake 2103.86 ml  Output 2975 ml  Net -871.14 ml   Filed Weights   08/30/19 0500 08/31/19 0455 09/03/19 0442  Weight: 88.1 kg 86.4 kg 87 kg    Examination: General: Elderly adult male lying in bed on vent in no acute distress, critically ill-appearing HEENT: MM pink/moist, ETT, pupils equal and reactive Neuro: Sedate CV: s1s2 RRR, no m/r/g PULM: Nonlabored, mild synchrony due to hiccups, diminished breath sounds bilaterally GI: soft, bsx4 active  Extremities: warm/dry, trace generalized edema   Skin: no rashes or lesions  Resolved Hospital Problem list   Treated empirically for CAP antibiotics discontinued 1/5 Atrial fibrillation/flutter, this continued for 48 hours and resolved on 1/6  Assessment & Plan:   Acute Hypoxemic Respiratory Failure due to COVID-19 PNA / Pneumonitis Ruled out HCAP 1/5. Completed remdesivir, decadron.  -  low Vt ventilation 4-8cc/kg -goal plateau pressure <30, driving pressure <34 cm H2O -target PaO2 55-65, titrate PEEP/FiO2 per ARDS protocol  -if P/F ratio <150, consider prone therapy for 16 hours per day -goal CVP <4, diuresis as necessary -VAP prevention measures  -follow intermittent CXR    Sedation Need in setting of Mechanical Ventilation  Toxic Metabolic Encephalopathy  Hx Depression  -PAD protocol with Dilaudid, Versed -Continue bowel regimen -Nightly melatonin -Continue Lexapro, Wellbutrin  Intermittent Fever  Noted intermittent fevers from 1/4.  No change in WBC.  LE dopplers negative.  Treated with empiric abx. Last BM 11/9.  -Monitor with all access removed -Plan to place PICC line 1/14 if fever curve improved -Lactulose dosing for BM  Mild AKI  Hypernatremia  Suspect in setting of active diuresis as improved with holding 1/10, 1/11 -Continue free water -Trend BMP / urinary output -Replace electrolytes as indicated -Avoid nephrotoxic agents, ensure adequate renal perfusion -consider repeat lasix 1/14  DM II with Hyperglycemia -increase Levemir -continue SSI -TF coverage Q4  Hx HTN CAD -hold home antihypertensives -continue ASA, statin    Best practice:  Diet: TF Pain/Anxiety/Delirium protocol (if indicated): PAD protocol  VAP protocol (if indicated): in place DVT prophylaxis: enoxaparin GI prophylaxis: Pepcid Glucose control: SSI, Levemir Mobility: BR Code Status: Full Code  Family Communication: Wife Benjamine Mola) updated via phone 1/12  Disposition: ICU     Critical Care Time: 8 minutes   Noe Gens, MSN, NP-C [facility] 09/03/2019, 3:58 PM   Please see [email or web address] for pager details.

## 2019-09-03 NOTE — Progress Notes (Signed)
OT Cancellation Note  Patient Details Name: [name] [name] MRN: [id number] DOB: [birth year]   Cancelled Treatment:    Reason Eval/Treat Not Completed: Patient not medically ready. Pt sedated on vent. Peep of 12. Will check back later this week.   Taniyah Ballow,HILLARY 09/03/2019, 8:20 AM  Luisa Dago, OT/L   Acute OT Clinical Specialist Acute Rehabilitation Services Pager [phone] Office [phone]

## 2019-09-04 DIAGNOSIS — J9600 Acute respiratory failure, unspecified whether with hypoxia or hypercapnia: Secondary | ICD-10-CM

## 2019-09-04 LAB — POCT I-STAT 7, (LYTES, BLD GAS, ICA,H+H)
Acid-Base Excess: 12 mmol/L — ABNORMAL HIGH (ref 0.0–2.0)
Bicarbonate: 37.3 mmol/L — ABNORMAL HIGH (ref 20.0–28.0)
Calcium, Ion: 1.25 mmol/L (ref 1.15–1.40)
HCT: 33 % — ABNORMAL LOW (ref 39.0–52.0)
Hemoglobin: 11.2 g/dL — ABNORMAL LOW (ref 13.0–17.0)
O2 Saturation: 93 %
Patient temperature: 100.7
Potassium: 4 mmol/L (ref 3.5–5.1)
Sodium: 148 mmol/L — ABNORMAL HIGH (ref 135–145)
TCO2: 39 mmol/L — ABNORMAL HIGH (ref 22–32)
pCO2 arterial: 55.5 mmHg — ABNORMAL HIGH (ref 32.0–48.0)
pH, Arterial: 7.44 (ref 7.350–7.450)
pO2, Arterial: 72 mmHg — ABNORMAL LOW (ref 83.0–108.0)

## 2019-09-04 LAB — CBC
HCT: 37.3 % — ABNORMAL LOW (ref 39.0–52.0)
Hemoglobin: 11.5 g/dL — ABNORMAL LOW (ref 13.0–17.0)
MCH: 32.1 pg (ref 26.0–34.0)
MCHC: 30.8 g/dL (ref 30.0–36.0)
MCV: 104.2 fL — ABNORMAL HIGH (ref 80.0–100.0)
Platelets: 165 10*3/uL (ref 150–400)
RBC: 3.58 MIL/uL — ABNORMAL LOW (ref 4.22–5.81)
RDW: 15.9 % — ABNORMAL HIGH (ref 11.5–15.5)
WBC: 15.1 10*3/uL — ABNORMAL HIGH (ref 4.0–10.5)
nRBC: 0.3 % — ABNORMAL HIGH (ref 0.0–0.2)

## 2019-09-04 LAB — GLUCOSE, CAPILLARY
Glucose-Capillary: 92 mg/dL (ref 70–99)
Glucose-Capillary: 139 mg/dL — ABNORMAL HIGH (ref 70–99)
Glucose-Capillary: 193 mg/dL — ABNORMAL HIGH (ref 70–99)
Glucose-Capillary: 233 mg/dL — ABNORMAL HIGH (ref 70–99)
Glucose-Capillary: 205 mg/dL — ABNORMAL HIGH (ref 70–99)

## 2019-09-04 LAB — BASIC METABOLIC PANEL
Anion gap: 6 (ref 5–15)
BUN: 90 mg/dL — ABNORMAL HIGH (ref 8–23)
CO2: 36 mmol/L — ABNORMAL HIGH (ref 22–32)
Calcium: 8.2 mg/dL — ABNORMAL LOW (ref 8.9–10.3)
Chloride: 107 mmol/L (ref 98–111)
Creatinine, Ser: 0.9 mg/dL (ref 0.61–1.24)
GFR calc Af Amer: >60 mL/min (ref >60)
GFR calc non Af Amer: >60 mL/min (ref >60)
Glucose, Bld: 92 mg/dL (ref 70–99)
Potassium: 4.2 mmol/L (ref 3.5–5.1)
Sodium: 149 mmol/L — ABNORMAL HIGH (ref 135–145)

## 2019-09-04 MED ORDER — VANCOMYCIN HCL IN DEXTROSE 1-5 GM/200ML-% IV SOLN
1000.0000 mg | Freq: Two times a day (BID) | INTRAVENOUS | Status: DC
  Administered 2019-09-04 – 2019-09-07 (×6): 1000 mg via INTRAVENOUS
  Filled 2019-09-04 (×7): qty 200

## 2019-09-04 MED ORDER — INSULIN DETEMIR 100 UNIT/ML ~~LOC~~ SOLN
24.0000 [IU] | Freq: Every day | SUBCUTANEOUS | Status: DC
  Administered 2019-09-05 – 2019-09-13 (×7): 24 [IU] via SUBCUTANEOUS
  Filled 2019-09-04 (×9): qty 0.24

## 2019-09-04 MED ORDER — SODIUM CHLORIDE 0.9 % IV SOLN
2.0000 g | Freq: Three times a day (TID) | INTRAVENOUS | Status: DC
  Administered 2019-09-04 – 2019-09-10 (×18): 2 g via INTRAVENOUS
  Filled 2019-09-04 (×18): qty 2

## 2019-09-04 MED ORDER — ACETAMINOPHEN 160 MG/5ML PO SOLN
650.0000 mg | Freq: Four times a day (QID) | ORAL | Status: DC | PRN
  Administered 2019-09-04 – 2019-09-07 (×3): 650 mg
  Filled 2019-09-04 (×4): qty 20.3

## 2019-09-04 MED ORDER — FUROSEMIDE 10 MG/ML IJ SOLN
40.0000 mg | Freq: Once | INTRAMUSCULAR | Status: AC
  Administered 2019-09-04: 40 mg via INTRAVENOUS
  Filled 2019-09-04: qty 4

## 2019-09-04 MED ORDER — VANCOMYCIN HCL 1500 MG/300ML IV SOLN
1500.0000 mg | Freq: Once | INTRAVENOUS | Status: AC
  Administered 2019-09-04: 12:00:00 1500 mg via INTRAVENOUS
  Filled 2019-09-04: qty 300

## 2019-09-04 NOTE — Progress Notes (Signed)
Spoke with patient's wife, Gentry Fitz, and provided full update/answered all questions at this time.

## 2019-09-04 NOTE — Progress Notes (Signed)
Called daughter and spoke with Herbert Seta re: updates for her father. Addressed all questions and concerns. Plan of care continued.

## 2019-09-04 NOTE — Progress Notes (Signed)
NAME:  [name] [name], MRN:  [id number], DOB:  07-04-1956, LOS: 15 ADMISSION DATE:  07/31/2019, CONSULTATION DATE: 08/21/2019 REFERRING MD: Dr. Randol Kern, CHIEF COMPLAINT: Hypoxemic respiratory failure  Brief History   64 year old man with CAD, hypertension, diabetes, OSA.  Admitted with COVID-19 pneumonia and hypoxemic respiratory failure 12/30.  Past Medical History  CAD - s/p CABG RBBB HTN HLD DM   Significant Hospital Events   12/30 Admit to ICU on NRB, HHFNC.  Decadron started.  Remdesivir started,Tocilizumab x1  12/31 Empiric abx started 1/02 PCCM consulted. Intubated.  1/03 Requiring NMB, PEEP and FiO2 improving.  Completed remdesivir 1/04 Prone positioning protocol given PF <150.  Recultured for fever. Narrow tachy, given adenosine which identified underlying atrial flutter, loaded with amiodarone and infusion initiated 1/05 Ongoing fever, empiric nosocomial coverage initiated, in spite of this making improvement and FiO2 and PEEP requirements 1/06 Still spiking fevers, did have hemoglobin drop of almost 2 g, checking lower extremity ultrasounds to rule out venous thrombosis 1/07 Still having fever [place] LEs still pending. Failed SBT (no spont effort), during the afternoon hours developed worsening hypoxia in case of dyssynchrony, IV fentanyl added back 1/08 Continues to require more oxygen & peep still having trouble with ventilator synchrony changing sedation regimen to Dilaudid and propofol 1/12 Ongoing intermittent fevers, LE duplex negative. Orders for line change.  1/14 Fever to 101.  Lines out 1/12.    Consults:  PCCM 12/31  Procedures:  ETT 1/2 >>  Right IJ CVC 1/2 >> 1/12  Significant Diagnostic Tests:  Lower extremity ultrasound 1/6 >> negative  Micro Data:  SARS CoV 2 12/27 >> positive MRSA screen 12/31 >> negative Blood 12/30 >> negative  BCx2 1/4 >> negative  Respiratory culture 1/4 >> few candida albicans Urine culture 1/5 >> negative   Antimicrobials:    Remdesivir 12/30 >> 1/3 Tocilizumab x1, 12/30 Azithromycin 12/31 >> 1/4 Ceftriaxone 12/31 >> 1/4 Cefepime 1/5 >> 1/9 Vancomycin 1/5 >> 1/9 Cefepime 1/14 >>  Vanco 1/14 >>  Interim history/subjective:  Tmax 101 I/O - 2.8L UOP, net neg 214 ml in 24hours  Glucose range - 137-220 PEEP 10, 50% FiO2, Peak 28, Pplat 23, driving pressure 13 Had BM after days of constipation No acute events overnight   Objective   Blood pressure (!) 105/57, pulse (!) 111, temperature 100.1 F (37.8 C), temperature source Oral, resp. rate (!) 38, height 5\' 10"  (1.778 m), weight 84.3 kg, SpO2 93 %.    Vent Mode: PRVC FiO2 (%):  [50 %-70 %] 50 % Set Rate:  [30 bmp] 30 bmp Vt Set:  [430 mL] 430 mL PEEP:  [10 cmH20] 10 cmH20 Plateau Pressure:  [21 cmH20-23 cmH20] 23 cmH20   Intake/Output Summary (Last 24 hours) at 09/04/2019 1035 Last data filed at 09/04/2019 0900 Gross per 24 hour  Intake 2350.48 ml  Output 3180 ml  Net -829.52 ml   Filed Weights   08/31/19 0455 09/03/19 0442 09/04/19 0500  Weight: 86.4 kg 87 kg 84.3 kg    Examination: General: critically ill appearing adult male lying in bed on vent in NAD HEENT: MM pink/moist, ETT, pupils =/reactive Neuro: sedate  CV: s1s2 RRR, no m/r/g PULM:  Mild dyssynchrony but no distress/WOB, lungs bilaterally diminished  GI: soft, bsx4 active  Extremities: warm/dry, trace generalized edema  Skin: no rashes or lesions  Resolved Hospital Problem list   Treated empirically for CAP antibiotics discontinued 1/5 Atrial fibrillation/flutter, this continued for 48 hours and resolved on 1/6  Assessment &  Plan:   Acute Hypoxemic Respiratory Failure due to COVID-19 PNA / Pneumonitis Ruled out HCAP 1/5. Completed remdesivir, decadron.  -low Vt ventilation 4-8cc/kg -goal plateau pressure <30, driving pressure <93 cm H2O -target PaO2 55-65, titrate PEEP/FiO2 per ARDS protocol  -VAP prevention measures  -follow intermittent CXR  -lasix 40 mg IV  x1  Sedation Need in setting of Mechanical Ventilation  Toxic Metabolic Encephalopathy  Hx Depression  -PAD protocol with dilaudid + versed -bowel regimen  -melatonin -continue lexapro, wellbutrin   Intermittent Fever  Noted intermittent fevers from 1/4.  No change in WBC.  LE dopplers negative.  Treated with empiric abx. Last BM 11/9.  -empiric abx as above, initiated 1/14.  No source fever, may be ARDS alone driving temps  -consider PICC line placement once fever curve improved   Mild AKI  Hypernatremia  Suspect in setting of active diuresis as improved with holding 1/10, 1/11 -continue free water  -lasix as above  -Trend BMP / urinary output -Replace electrolytes as indicated -Avoid nephrotoxic agents, ensure adequate renal perfusion  DM II with Hyperglycemia -increase levemir to 24 units  -SSI  -10 units TF coverage Q4  Hx HTN CAD -continue ASA, statin   Best practice:  Diet: TF Pain/Anxiety/Delirium protocol (if indicated): PAD protocol  VAP protocol (if indicated): in place DVT prophylaxis: enoxaparin GI prophylaxis: Pepcid Glucose control: SSI, Levemir Mobility: BR Code Status: Full Code  Family Communication: Wife update via phone 1/14 on plan of care. Hopeful to be nearing point of possible wean in next few days Disposition: ICU     Critical Care Time: 40 minutes   Noe Gens, MSN, NP-C [facility] 09/04/2019, 10:35 AM   Please see [email or web address] for pager details.

## 2019-09-04 NOTE — Progress Notes (Signed)
Pharmacy Antibiotic Note  [name] [name] is a 64 y.o. male admitted on 2019/09/01 with COVID-19 pneumonia.  He previously completed courses of ceftriaxone/azithromycin, and cefepime/vanc for prior suspected pneumonia.  Cultues have been no growth, except for few candida albicans in the tracheal aspirate.  Pharmacy has been consulted for Cefepime and Vancomycin dosing.  He continues to have ongoing fever, Tm 102.5.  WBC increased to 15 (on steroids).  Plan: Cefepime 2g IV q8h Vancomycin 1500 mg IV x1 then 1g IV q12h. Measure Vanc peak and trough at steady state as needed. Goal AUC = 400 - 550.  Expected AUC 436 using SCr 0.9  Follow up renal function, culture results, and clinical course.   Height: 5\' 10"  (177.8 cm) Weight: 185 lb 13.6 oz (84.3 kg) IBW/kg (Calculated) : 73  Temp (24hrs), Avg:100.5 F (38.1 C), Min:99.5 F (37.5 C), Max:102.5 F (39.2 C)  Recent Labs  Lab 08/31/19 0500 09/01/19 1710 09/02/19 0516 09/03/19 0544 09/04/19 0428  WBC 12.5* 14.1* 8.5 14.7* 15.1*  CREATININE 1.00 0.82 0.83 0.90 0.90    Estimated Creatinine Clearance: 85.6 mL/min (by C-G formula based on SCr of 0.9 mg/dL).    Allergies  Allergen Reactions  . Hydrocodone-Acetaminophen Swelling    Swelling of hands and feet followed by skin peeling off of hands and feet  . Hydrochlorothiazide Itching    Antimicrobials this admission: Remdesivir 12/30>> 1/3 CTX 12/31 >> 1/4 Azith 12/31>> 1/4 Actemra 12/31  cPlasma 12/31 Actemra 1/1 Cefepime 1/5 >>1/9 Vanc 1/5 >> 1/9  Dose adjustments this admission:   Microbiology results: 12/30 BCx: negF  12/31 MRSA PCR neg 1/4 BCx: ngtd 1/5 UCx: NGF 1/7 TA >> Few candida  1/13 BCx:  Thank you for allowing pharmacy to be a part of this patient's care.  2/13 PharmD, BCPS Clinical pharmacist phone 7am- 5pm: [phone] 09/04/2019 10:09 AM

## 2019-09-04 NOTE — Progress Notes (Signed)
  PT Cancellation Note  Patient Details Name: [name] [name] MRN: [id number] DOB: Aug 14, 1956   Cancelled Treatment:    Reason Eval/Treat Not Completed: Medical issues which prohibited therapy, vent, sedated, etc.    [name] [name] 09/04/2019, 7:42 AM  Blanchard Kelch PT Acute Rehabilitation Services Pager [phone] Office [phone]

## 2019-09-05 LAB — POCT I-STAT 7, (LYTES, BLD GAS, ICA,H+H)
Acid-Base Excess: 5 mmol/L — ABNORMAL HIGH (ref 0.0–2.0)
Bicarbonate: 34.7 mmol/L — ABNORMAL HIGH (ref 20.0–28.0)
Calcium, Ion: 1.25 mmol/L (ref 1.15–1.40)
HCT: 33 % — ABNORMAL LOW (ref 39.0–52.0)
Hemoglobin: 11.2 g/dL — ABNORMAL LOW (ref 13.0–17.0)
O2 Saturation: 83 %
Patient temperature: 100.8
Potassium: 4.6 mmol/L (ref 3.5–5.1)
Sodium: 150 mmol/L — ABNORMAL HIGH (ref 135–145)
TCO2: 37 mmol/L — ABNORMAL HIGH (ref 22–32)
pCO2 arterial: 82.3 mmHg (ref 32.0–48.0)
pH, Arterial: 7.239 — ABNORMAL LOW (ref 7.350–7.450)
pO2, Arterial: 63 mmHg — ABNORMAL LOW (ref 83.0–108.0)

## 2019-09-05 LAB — GLUCOSE, CAPILLARY
Glucose-Capillary: 107 mg/dL — ABNORMAL HIGH (ref 70–99)
Glucose-Capillary: 146 mg/dL — ABNORMAL HIGH (ref 70–99)
Glucose-Capillary: 221 mg/dL — ABNORMAL HIGH (ref 70–99)
Glucose-Capillary: 223 mg/dL — ABNORMAL HIGH (ref 70–99)
Glucose-Capillary: 236 mg/dL — ABNORMAL HIGH (ref 70–99)

## 2019-09-05 LAB — COMPREHENSIVE METABOLIC PANEL
ALT: 99 U/L — ABNORMAL HIGH (ref 0–44)
AST: 64 U/L — ABNORMAL HIGH (ref 15–41)
Albumin: 2.2 g/dL — ABNORMAL LOW (ref 3.5–5.0)
Alkaline Phosphatase: 81 U/L (ref 38–126)
Anion gap: 7 (ref 5–15)
BUN: 87 mg/dL — ABNORMAL HIGH (ref 8–23)
CO2: 35 mmol/L — ABNORMAL HIGH (ref 22–32)
Calcium: 8.1 mg/dL — ABNORMAL LOW (ref 8.9–10.3)
Chloride: 108 mmol/L (ref 98–111)
Creatinine, Ser: 0.75 mg/dL (ref 0.61–1.24)
GFR calc Af Amer: >60 mL/min (ref >60)
GFR calc non Af Amer: >60 mL/min (ref >60)
Glucose, Bld: 143 mg/dL — ABNORMAL HIGH (ref 70–99)
Potassium: 3.4 mmol/L — ABNORMAL LOW (ref 3.5–5.1)
Sodium: 150 mmol/L — ABNORMAL HIGH (ref 135–145)
Total Bilirubin: 0.7 mg/dL (ref 0.3–1.2)
Total Protein: 5.2 g/dL — ABNORMAL LOW (ref 6.5–8.1)

## 2019-09-05 LAB — CBC
HCT: 35.7 % — ABNORMAL LOW (ref 39.0–52.0)
Hemoglobin: 10.8 g/dL — ABNORMAL LOW (ref 13.0–17.0)
MCH: 31.9 pg (ref 26.0–34.0)
MCHC: 30.3 g/dL (ref 30.0–36.0)
MCV: 105.3 fL — ABNORMAL HIGH (ref 80.0–100.0)
Platelets: 163 10*3/uL (ref 150–400)
RBC: 3.39 MIL/uL — ABNORMAL LOW (ref 4.22–5.81)
RDW: 16 % — ABNORMAL HIGH (ref 11.5–15.5)
WBC: 12.5 10*3/uL — ABNORMAL HIGH (ref 4.0–10.5)
nRBC: 0.2 % (ref 0.0–0.2)

## 2019-09-05 MED ORDER — POTASSIUM CHLORIDE 20 MEQ/15ML (10%) PO SOLN
40.0000 meq | Freq: Once | ORAL | Status: AC
  Administered 2019-09-05: 10:00:00 40 meq
  Filled 2019-09-05: qty 30

## 2019-09-05 MED ORDER — ROCURONIUM BROMIDE 10 MG/ML (PF) SYRINGE
100.0000 mg | PREFILLED_SYRINGE | Freq: Once | INTRAVENOUS | Status: AC
  Administered 2019-09-05: 100 mg via INTRAVENOUS
  Filled 2019-09-05 (×2): qty 10

## 2019-09-05 MED ORDER — POTASSIUM CHLORIDE 20 MEQ/15ML (10%) PO SOLN
20.0000 meq | Freq: Once | ORAL | Status: AC
  Administered 2019-09-05: 16:00:00 20 meq
  Filled 2019-09-05: qty 15

## 2019-09-05 MED ORDER — FUROSEMIDE 10 MG/ML IJ SOLN
40.0000 mg | Freq: Once | INTRAMUSCULAR | Status: AC
  Administered 2019-09-05: 16:00:00 40 mg via INTRAVENOUS
  Filled 2019-09-05: qty 4

## 2019-09-05 MED ORDER — FENTANYL 2500MCG IN NS 250ML (10MCG/ML) PREMIX INFUSION
0.0000 ug/h | INTRAVENOUS | Status: DC
  Administered 2019-09-05: 25 ug/h via INTRAVENOUS
  Filled 2019-09-05: qty 250

## 2019-09-05 NOTE — Progress Notes (Addendum)
PHARMACY - PHYSICIAN COMMUNICATION CRITICAL VALUE ALERT - BLOOD CULTURE IDENTIFICATION (BCID)  [name] [name] is an 64 y.o. male who presented to [facility] on 08/03/2019 with a chief complaint of CoVid-19 Pneumonia.  Assessment:  GS: 1 of 3 (or possibly 4)  bottles positive for GPC in clusters -will run BCID only if 2nd set comes back positive  Name of physician (or Provider) Contacted: Dr. Karren Burly  Current antibiotics: vancomycin per pharmacy  Changes to prescribed antibiotics recommended:  Patient is on recommended antibiotics - No changes needed  No results found for this or any previous visit.  [name] 09/05/2019  5:18 AM

## 2019-09-05 NOTE — Progress Notes (Signed)
OT Cancellation Note  Patient Details Name: [name] [name] MRN: [id number] DOB: 1955/09/18   Cancelled Treatment:    Reason Eval/Treat Not Completed: Patient not medically ready. Pt intubated and sedated. Nsg attempting to slowly wean. Will check back on Monday. Thank you.  [facility] Teegarden M Farrie Sann Minoru Chap MSOT, OTR/L Acute Rehab Pager: [phone] Office: [phone] 09/05/2019, 9:52 AM

## 2019-09-05 NOTE — Plan of Care (Signed)

## 2019-09-05 NOTE — Progress Notes (Addendum)
eLink Physician-Brief Progress Note Patient Name: SAMWISE ECKARDT DOB: Apr 26, 1956 MRN: [id number]   Date of Service  09/05/2019  HPI/Events of Note  Ventricular dyssynchrony with desaturation into the upper 80's  eICU Interventions  Versed infusion increased to 4 mg/ hour, Fentanyl infusion started, Rocuronium 100 mg iv x 1 given, ABG at midnight        Shalie Schremp U Thana Ramp 09/05/2019, 10:08 PM

## 2019-09-05 NOTE — Progress Notes (Signed)
Called patient's wife, Lanora Manis. Update given.

## 2019-09-05 NOTE — Progress Notes (Signed)
NAME:  [name] [name], MRN:  [id number], DOB:  1955-12-07, LOS: 34 ADMISSION DATE:  08/17/2019, CONSULTATION DATE: 08/21/2019 REFERRING MD: Dr. Waldron Labs, CHIEF COMPLAINT: Hypoxemic respiratory failure  Brief History   64 year old man with CAD, hypertension, diabetes, OSA.  Admitted with COVID-19 pneumonia and hypoxemic respiratory failure 12/30.  Past Medical History  CAD - s/p CABG RBBB HTN HLD DM   Significant Hospital Events   12/30 Admit to ICU on NRB, HHFNC.  Decadron started.  Remdesivir started,Tocilizumab x1  12/31 Empiric abx started 1/02 PCCM consulted. Intubated.  1/03 Requiring NMB, PEEP and FiO2 improving.  Completed remdesivir 1/04 Prone positioning protocol given PF <150.  Recultured for fever. Narrow tachy, given adenosine which identified underlying atrial flutter, loaded with amiodarone and infusion initiated 1/05 Ongoing fever, empiric nosocomial coverage initiated, in spite of this making improvement and FiO2 and PEEP requirements 1/06 Still spiking fevers, did have hemoglobin drop of almost 2 g, checking lower extremity ultrasounds to rule out venous thrombosis 1/07 Still having fever [place] LEs still pending. Failed SBT (no spont effort), during the afternoon hours developed worsening hypoxia in case of dyssynchrony, IV fentanyl added back 1/08 Continues to require more oxygen & peep still having trouble with ventilator synchrony changing sedation regimen to Dilaudid and propofol 1/12 Ongoing intermittent fevers, LE duplex negative. Orders for line change.  1/14 Fever to 101.  Lines out 1/12.    Consults:  PCCM 12/31  Procedures:  ETT 1/2 >>  Right IJ CVC 1/2 >> 1/12  Significant Diagnostic Tests:  Lower extremity ultrasound 1/6 >> negative  Micro Data:  SARS CoV 2 12/27 >> positive MRSA screen 12/31 >> negative Blood 12/30 >> negative  BCx2 1/4 >> negative  Respiratory culture 1/4 >> few candida albicans Urine culture 1/5 >> negative   Antimicrobials:   Remdesivir 12/30 >> 1/3 Tocilizumab x1, 12/30 Azithromycin 12/31 >> 1/4 Ceftriaxone 12/31 >> 1/4 Cefepime 1/5 >> 1/9 Vancomycin 1/5 >> 1/9 Cefepime 1/14 >>  Vanco 1/14 >>  Interim history/subjective:  Tmax 99.7 I/O - 2.7L UOP, +875ml in 24h   Glucose range -  92-221  PEEP 10, 70% FiO2, Peak 25, Pplat 20, driving pressure 10 O2 increased overnight, neo started overnight (20 mcgs)  Objective   Blood pressure (!) 94/49, pulse 95, temperature 99 F (37.2 C), temperature source Axillary, resp. rate 14, height 5\' 10"  (1.778 m), weight 89.9 kg, SpO2 94 %.    Vent Mode: PRVC FiO2 (%):  [50 %-100 %] 70 % Set Rate:  [30 bmp] 30 bmp Vt Set:  [430 mL] 430 mL PEEP:  [10 cmH20] 10 cmH20 Plateau Pressure:  [18 cmH20-23 cmH20] 22 cmH20   Intake/Output Summary (Last 24 hours) at 09/05/2019 0855 Last data filed at 09/05/2019 7062 Gross per 24 hour  Intake 3488.81 ml  Output 2565 ml  Net 923.81 ml   Filed Weights   09/03/19 0442 09/04/19 0500 09/05/19 0500  Weight: 87 kg 84.3 kg 89.9 kg    Examination: General: critically ill appearing adult male lying in bed on vent in NAD HEENT: MM pink/moist, ETT, pupils =/reactive Neuro: sedate  CV: s1s2 rrr, no m/r/g PULM:  Non-labored, lungs clear anterior, diminished lower lateral  GI: soft, bsx4 active  Extremities: warm/dry, 1+ generalized edema  Skin: no rashes or lesions  Resolved Hospital Problem list   Treated empirically for CAP antibiotics discontinued 1/5 Atrial fibrillation/flutter, this continued for 48 hours and resolved on 1/6  Assessment & Plan:   Acute Hypoxemic Respiratory  Failure due to COVID-19 PNA / Pneumonitis Ruled out HCAP 1/5. Completed remdesivir, decadron.  -low Vt ventilation 4-8cc/kg -goal plateau pressure <30, driving pressure <53 cm Z7Q -target PaO2 55-65, titrate PEEP/FiO2 per ARDS protocol  -if P/F ratio <150, consider prone therapy for 16 hours per day -goal CVP <4, diuresis as necessary -VAP prevention  measures  -follow intermittent CXR  -lasix 40 mg IV x1  Sedation Need in setting of Mechanical Ventilation  Toxic Metabolic Encephalopathy  Hx Depression  -PAD protocol with dilaudid + versed -bowel regimen  -melatonin, lexapro, wellbutrin   Intermittent Fever  Noted intermittent persistent fevers from 1/4.  No change in WBC.  LE dopplers negative.  Treated with empiric abx. Last BM 11/9.  -continue empiric abx as above, initiated 1/14.  No clear source, ? ARDS driving  -hold PICC line for now  Mild AKI  Hypernatremia  Hypokalemia  Suspect in setting of active diuresis as improved with holding 1/10, 1/11 -continue free water  -Trend BMP / urinary output -Replace electrolytes as indicated -Avoid nephrotoxic agents, ensure adequate renal perfusion  DM II with Hyperglycemia -levemir 24 units QD  -SSI, moderate scale  -TF coverage 10 units Q4  Anemia  -trend CBC   Hx HTN CAD -continue ASA, statin  Best practice:  Diet: TF Pain/Anxiety/Delirium protocol (if indicated): PAD protocol  VAP protocol (if indicated): in place DVT prophylaxis: enoxaparin GI prophylaxis: Pepcid Glucose control: SSI, Levemir Mobility: BR Code Status: Full Code  Family Communication: Wife Lanora Manis) updated on plan of care 1/15 via phone Disposition: ICU     Critical Care Time: 30 minutes   Canary Brim, MSN, NP-C [facility] 09/05/2019, 8:55 AM   Please see [email or web address] for pager details.

## 2019-09-06 ENCOUNTER — Inpatient Hospital Stay (HOSPITAL_COMMUNITY): Payer: BC Managed Care – PPO

## 2019-09-06 LAB — GLUCOSE, CAPILLARY
Glucose-Capillary: 105 mg/dL — ABNORMAL HIGH (ref 70–99)
Glucose-Capillary: 124 mg/dL — ABNORMAL HIGH (ref 70–99)
Glucose-Capillary: 164 mg/dL — ABNORMAL HIGH (ref 70–99)
Glucose-Capillary: 201 mg/dL — ABNORMAL HIGH (ref 70–99)
Glucose-Capillary: 226 mg/dL — ABNORMAL HIGH (ref 70–99)
Glucose-Capillary: 243 mg/dL — ABNORMAL HIGH (ref 70–99)
Glucose-Capillary: 98 mg/dL (ref 70–99)

## 2019-09-06 LAB — CBC
HCT: 38.9 % — ABNORMAL LOW (ref 39.0–52.0)
HCT: 33.5 % — ABNORMAL LOW (ref 39.0–52.0)
Hemoglobin: 11.6 g/dL — ABNORMAL LOW (ref 13.0–17.0)
Hemoglobin: 9.5 g/dL — ABNORMAL LOW (ref 13.0–17.0)
MCH: 32 pg (ref 26.0–34.0)
MCH: 31.7 pg (ref 26.0–34.0)
MCHC: 29.8 g/dL — ABNORMAL LOW (ref 30.0–36.0)
MCHC: 28.4 g/dL — ABNORMAL LOW (ref 30.0–36.0)
MCV: 107.2 fL — ABNORMAL HIGH (ref 80.0–100.0)
MCV: 111.7 fL — ABNORMAL HIGH (ref 80.0–100.0)
Platelets: 180 10*3/uL (ref 150–400)
Platelets: 175 10*3/uL (ref 150–400)
RBC: 3.63 MIL/uL — ABNORMAL LOW (ref 4.22–5.81)
RBC: 3 MIL/uL — ABNORMAL LOW (ref 4.22–5.81)
RDW: 16.2 % — ABNORMAL HIGH (ref 11.5–15.5)
RDW: 16.4 % — ABNORMAL HIGH (ref 11.5–15.5)
WBC: 16.6 10*3/uL — ABNORMAL HIGH (ref 4.0–10.5)
WBC: 20.1 10*3/uL — ABNORMAL HIGH (ref 4.0–10.5)
nRBC: 0.5 % — ABNORMAL HIGH (ref 0.0–0.2)
nRBC: 1.3 % — ABNORMAL HIGH (ref 0.0–0.2)

## 2019-09-06 LAB — BASIC METABOLIC PANEL
Anion gap: 6 (ref 5–15)
Anion gap: 8 (ref 5–15)
BUN: 86 mg/dL — ABNORMAL HIGH (ref 8–23)
BUN: 87 mg/dL — ABNORMAL HIGH (ref 8–23)
CO2: 33 mmol/L — ABNORMAL HIGH (ref 22–32)
CO2: 32 mmol/L (ref 22–32)
Calcium: 8.2 mg/dL — ABNORMAL LOW (ref 8.9–10.3)
Calcium: 7.8 mg/dL — ABNORMAL LOW (ref 8.9–10.3)
Chloride: 110 mmol/L (ref 98–111)
Chloride: 108 mmol/L (ref 98–111)
Creatinine, Ser: 0.82 mg/dL (ref 0.61–1.24)
Creatinine, Ser: 1.08 mg/dL (ref 0.61–1.24)
GFR calc Af Amer: >60 mL/min (ref >60)
GFR calc Af Amer: >60 mL/min (ref >60)
GFR calc non Af Amer: >60 mL/min (ref >60)
GFR calc non Af Amer: >60 mL/min (ref >60)
Glucose, Bld: 131 mg/dL — ABNORMAL HIGH (ref 70–99)
Glucose, Bld: 326 mg/dL — ABNORMAL HIGH (ref 70–99)
Potassium: 4.6 mmol/L (ref 3.5–5.1)
Potassium: 4.6 mmol/L (ref 3.5–5.1)
Sodium: 149 mmol/L — ABNORMAL HIGH (ref 135–145)
Sodium: 148 mmol/L — ABNORMAL HIGH (ref 135–145)

## 2019-09-06 LAB — POCT I-STAT 7, (LYTES, BLD GAS, ICA,H+H)
Acid-Base Excess: 6 mmol/L — ABNORMAL HIGH (ref 0.0–2.0)
Bicarbonate: 36 mmol/L — ABNORMAL HIGH (ref 20.0–28.0)
Calcium, Ion: 1.27 mmol/L (ref 1.15–1.40)
HCT: 33 % — ABNORMAL LOW (ref 39.0–52.0)
Hemoglobin: 11.2 g/dL — ABNORMAL LOW (ref 13.0–17.0)
O2 Saturation: 85 %
Patient temperature: 98.6
Potassium: 4.5 mmol/L (ref 3.5–5.1)
Sodium: 149 mmol/L — ABNORMAL HIGH (ref 135–145)
TCO2: 39 mmol/L — ABNORMAL HIGH (ref 22–32)
pCO2 arterial: 83.3 mmHg (ref 32.0–48.0)
pH, Arterial: 7.244 — ABNORMAL LOW (ref 7.350–7.450)
pO2, Arterial: 61 mmHg — ABNORMAL LOW (ref 83.0–108.0)

## 2019-09-06 LAB — PROTIME-INR
INR: 1.3 — ABNORMAL HIGH (ref 0.8–1.2)
Prothrombin Time: 16.1 seconds — ABNORMAL HIGH (ref 11.4–15.2)

## 2019-09-06 LAB — APTT: aPTT: 30 seconds (ref 24–36)

## 2019-09-06 MED ORDER — SODIUM BICARBONATE-DEXTROSE 150-5 MEQ/L-% IV SOLN
150.0000 meq | INTRAVENOUS | Status: DC
  Administered 2019-09-06 (×2): 150 meq via INTRAVENOUS
  Filled 2019-09-06 (×5): qty 1000

## 2019-09-06 MED ORDER — ARTIFICIAL TEARS OPHTHALMIC OINT
TOPICAL_OINTMENT | Freq: Three times a day (TID) | OPHTHALMIC | Status: DC
  Administered 2019-09-07: 1 via OPHTHALMIC
  Filled 2019-09-06 (×2): qty 3.5

## 2019-09-06 MED ORDER — EPINEPHRINE 1 MG/10ML IJ SOSY
PREFILLED_SYRINGE | INTRAMUSCULAR | Status: AC
  Filled 2019-09-06: qty 30

## 2019-09-06 MED ORDER — ROCURONIUM BROMIDE 10 MG/ML (PF) SYRINGE
1.0000 mg/kg | PREFILLED_SYRINGE | INTRAVENOUS | Status: AC | PRN
  Administered 2019-09-06: 89.9 mg via INTRAVENOUS
  Filled 2019-09-06: qty 10

## 2019-09-06 NOTE — Progress Notes (Signed)
Post Arrest ABG 7.03/CO2  >97/PaO2 74, the rest is unreadable due to CO2.

## 2019-09-06 NOTE — Progress Notes (Signed)
Patient's head repositioned and arms rotated.  Patient tolerated well.  No skin breakdown noted at this time.    

## 2019-09-06 NOTE — Code Documentation (Signed)
PCCM interval note, CODE BLUE documentation  Patient experienced an acute bradycardic arrest while supine, adequately oxygenated at approximately 13:15.  Underwent 9 minutes of CPR, bicarbonate x1, epinephrine x3 with ROSC to sinus tachycardia.  Appeared to be a vagal reaction with stimulation.  Ventilator circuit was intact.  Follow-up ABG >> 7.03 /93 /74 on maximum vent support. Additional bicarbonate given x1, minute ventilation increased RR to 35, liberalized VT to 7 cc/kg  I undertook discussions with the patient's wife and son by phone, explained that [name] [name] had experienced bradycardic arrest secondary to our inability to adequately support his oxygenation and ventilation.  As we are maximally treating his ARDS and respiratory failure I recommended that he likely would not survive and should not undergo CPR again.  [name] [name] and the patient's son needed to process this information, were not ready to change his CODE STATUS at that time.  Patient decompensated again with bradycardia and PEA arrest approximately 14:07.  Another round of CPR was performed with epinephrine.  A bicarbonate drip was started.  ROSC achieved after 2 minutes.  I spoke with the patient's wife and son again by phone, explained these events and also explained that I was concerned that given his failure to stabilize [name] [name] probably would not survive.  I explained that we would continue all present interventions including his high-dose phenylephrine, mechanical ventilation, all medications but that I would defer escalation and would not recommend we repeat CPR.  They voiced understanding.  They are arranging down to do a video visit so that they can interact with the patient.  We will continue to follow.  Additional CC time 45 minutes  [name], MD, PhD 09/06/2019, 2:51 PM [facility] [phone] or if no answer [phone]

## 2019-09-06 NOTE — Progress Notes (Signed)
RT responded to Code Blue, Pt placed supine during CPR. ROSC obtained after 9 minutes of CPR, 3 epi, 1 bicarb. Ett secured with commercial tube holder in proper position. Pt to remain supine.

## 2019-09-06 NOTE — Progress Notes (Addendum)
This RN witnessed patient have bradycardic event down to the 40s with pulse and then go into PEA arrest and then to asystole. Code called. Code ran from 1300-1309. Epinephrine given x3. Bicarb given x1. ROSC at 1309.   Patient flipped supine during code.   Family updated during code by RN and after ROSC by physician.   BG 201 prior to code. BG 243 during code  Labs drawn and sent post ROSC.  HR 111, BP 146/56, 02 sat 91% at this time.

## 2019-09-06 NOTE — Progress Notes (Signed)
Nutrition Follow-up  DOCUMENTATION CODES:   Not applicable  INTERVENTION:   Resume Vital 1.5 at 40 ml/hr via Cortrak Pro-Stat 60 mL QID Provides 2240 kcals, 185 g of protein and 730 mL of free water  Continue Juven BID, each packet provides 80 calories, 8 grams of carbohydrate, 2.5  grams of protein (collagen), 7 grams of L-arginine and 7 grams of L-glutamine; supplement contains CaHMB, Vitamins C, E, B12 and Zinc to promote wound healing  NUTRITION DIAGNOSIS:   Inadequate oral intake related to acute illness, catabolic illness as evidenced by NPO status.  Being addressed via TF  GOAL:   Patient will meet greater than or equal to 90% of their needs  Progressing  MONITOR:   Vent status, Labs, Weight trends, TF tolerance, Skin  REASON FOR ASSESSMENT:   Consult, Ventilator Enteral/tube feeding initiation and management  ASSESSMENT:   64 yo male admitted with acute respiratory failure due to COVID-19 pneumonia requiring intubation. PMH includes DM, HTN, CAD, depression   RD working remotely.  1/02: Intubated 1/13: Cortrak placed  Prone positioning ongoing Patient is currently intubated on ventilator support MV: 13.5 L/min Temp (24hrs), Avg:99.8 F (37.7 C), Min:98.6 F (37 C), Max:100.8 F (38.2 C)  Propofol: NONE  Medications reviewed and include: SS novolog, Levemir, MVI Drips: Maxipime Dilaudid Versed Neo  Free water 300 ml every 4 hrs  Labs: CBGs 105,164,226,221,236 x 24 hrs, Na 149 (H), WBC 16.6 (H) Diet Order:   Diet Order    None      EDUCATION NEEDS:   Not appropriate for education at this time  Skin:  Skin Assessment: Skin Integrity Issues: Skin Integrity Issues:: DTI DTI: bilateral coccyx Other: MASD: coccyx, scrotum  Last BM:  1/16  Height:   Ht Readings from Last 1 Encounters:  09/04/19 5\' 10"  (1.778 m)    Weight:   Wt Readings from Last 1 Encounters:  09/05/19 89.9 kg    Ideal Body Weight:     BMI:  Body mass  index is 28.44 kg/m.  Estimated Nutritional Needs:   Kcal:  09/07/19 kcals  Protein:  145-170 g  Fluid:  >/= 2 L    [id number], RD, LDN Clinical Nutrition [facility] Telephone [phone] After Hours/Weekend Pager: [phone]

## 2019-09-06 NOTE — Progress Notes (Signed)
eLink Physician-Brief Progress Note Patient Name: [name] [name] DOB: 08/23/1955 MRN: [id number]   Date of Service  09/06/2019  HPI/Events of Note  Pt on  FiO2 of 100 % with PO2 of 60 and PCO2 of 83, unable to improve oxygenation or ventilation by adjustments to ventilator.  eICU Interventions  PRN Rocuronium, Proning protocol        Kofi Murrell U Nikie Cid 09/06/2019, 1:26 AM

## 2019-09-06 NOTE — Progress Notes (Signed)
Pt's face padded and patient proned without complication. No breakdown noted.

## 2019-09-06 NOTE — Progress Notes (Signed)
Updated wife Lanora Manis about plan of care and events over the night. Answered all questions and concerns at this time.

## 2019-09-06 NOTE — Progress Notes (Signed)
NAME:  [name] [name], MRN:  [id number], DOB:  [birth year], LOS: 17 ADMISSION DATE:  08/06/2019, CONSULTATION DATE: 08/21/2019 REFERRING MD: Dr. Randol Kern, CHIEF COMPLAINT: Hypoxemic respiratory failure  Brief History   64 year old man with CAD, hypertension, diabetes, OSA.  Admitted with COVID-19 pneumonia and hypoxemic respiratory failure 12/30.  Past Medical History  CAD - s/p CABG RBBB HTN HLD DM   Significant Hospital Events   12/30 Admit to ICU on NRB, HHFNC.  Decadron started.  Remdesivir started,Tocilizumab x1  12/31 Empiric abx started 1/02 PCCM consulted. Intubated.  1/03 Requiring NMB, PEEP and FiO2 improving.  Completed remdesivir 1/04 Prone positioning protocol given PF <150.  Recultured for fever. Narrow tachy, given adenosine which identified underlying atrial flutter, loaded with amiodarone and infusion initiated 1/05 Ongoing fever, empiric nosocomial coverage initiated, in spite of this making improvement and FiO2 and PEEP requirements 1/06 Still spiking fevers, did have hemoglobin drop of almost 2 g, checking lower extremity ultrasounds to rule out venous thrombosis 1/07 Still having fever [place] LEs still pending. Failed SBT (no spont effort), during the afternoon hours developed worsening hypoxia in case of dyssynchrony, IV fentanyl added back 1/08 Continues to require more oxygen & peep still having trouble with ventilator synchrony changing sedation regimen to Dilaudid and propofol 1/12 Ongoing intermittent fevers, LE duplex negative. Orders for line change.  1/14 Fever to 101.  Lines out 1/12. 1/15 increasing FiO2 needs, increasing PEEP needs, correlated with decrease sedation and vent dyssynchrony  Consults:  PCCM 12/31  Procedures:  ETT 1/2 >>  Right IJ CVC 1/2 >> 1/12  Significant Diagnostic Tests:  Lower extremity ultrasound 1/6 >> negative  Micro Data:  SARS CoV 2 12/27 >> positive MRSA screen 12/31 >> negative Blood 12/30 >> negative  BCx2 1/4 >>  negative  Respiratory culture 1/4 >> few candida albicans Urine culture 1/5 >> negative   Antimicrobials:  Remdesivir 12/30 >> 1/3 Tocilizumab x1, 12/30 Azithromycin 12/31 >> 1/4 Ceftriaxone 12/31 >> 1/4 Cefepime 1/5 >> 1/9 Vancomycin 1/5 >> 1/9 Cefepime 1/14 >>  Vanco 1/14 >>  Interim history/subjective:  Vent dyssynchrony and associated desaturations last 24 hours, FiO2 increased. Placed in prone position overnight FiO2 up to 1.00, PEEP up to 16, VT was increased from 430 to 450 cc I/O+ 2.1 L total  Objective   Blood pressure (!) 107/49, pulse (!) 110, temperature 98.6 F (37 C), temperature source Axillary, resp. rate 13, height 5\' 10"  (1.778 m), weight 89.9 kg, SpO2 96 %.    Vent Mode: PRVC FiO2 (%):  [60 %-100 %] 90 % Set Rate:  [30 bmp] 30 bmp Vt Set:  [430 mL-450 mL] 450 mL PEEP:  [10 cmH20-16 cmH20] 16 cmH20 Plateau Pressure:  [24 cmH20-35 cmH20] 35 cmH20   Intake/Output Summary (Last 24 hours) at 09/06/2019 0851 Last data filed at 09/06/2019 0800 Gross per 24 hour  Intake 5174.66 ml  Output 4030 ml  Net 1144.66 ml   Filed Weights   09/03/19 0442 09/04/19 0500 09/05/19 0500  Weight: 87 kg 84.3 kg 89.9 kg    Examination: General: critically ill appearing adult male lying in bed on vent in NAD HEENT: MM pink/moist, ETT, pupils =/reactive Neuro: sedate  CV: s1s2 rrr, no m/r/g PULM:  Non-labored, lungs clear anterior, diminished lower lateral  GI: soft, bsx4 active  Extremities: warm/dry, 1+ generalized edema  Skin: no rashes or lesions  Resolved Hospital Problem list   Treated empirically for CAP antibiotics discontinued 1/5 Atrial fibrillation/flutter, this continued for 48  hours and resolved on 1/6  Assessment & Plan:   Acute Hypoxemic Respiratory Failure due to COVID-19 PNA / Pneumonitis Completed remdesivir, decadron.  Empiric antibiotics as below 1/14 Mechanical ventilation via ARDS protocol, target PRVC 6 cc/kg Wean PEEP and FiO2 as able Goal  plateau pressure less than 30, driving pressure less than 15 Require repeat paralytics depending on her ability to adequately sedate in order to tolerate prone positioning, FiO2 weaning Cycle prone positioning 1/16 Deep sedation per PAD protocol, goal RASS -4, Diuresis as blood pressure and renal function can tolerate, goal CVP 5-8.  Dosing daily, received 40 mg Lasix 1/15.  Repeat 1/16, follow renal function and sodium VAP prevention order set Follow chest x-ray, ABG  Sedation Need in setting of Mechanical Ventilation  Toxic Metabolic Encephalopathy  Hx Depression  PAD protocol, Dilaudid, Versed Melatonin, Lexapro, Wellbutrin Bowel regimen  Shock, suspect related to sedating medications, consider sepsis given his fevers although no clear source Wean phenylephrine as tolerated.  Goal MAP 65  Intermittent Fevers Empiric broad-spectrum antibiotics added on 1/14 Follow culture data and tailor appropriately Central line holiday  Mild AKI  Hypernatremia  Hypokalemia  Suspect in setting of active diuresis Dosing diuretics daily based on renal function, sodium, hemodynamics Replace electrolytes as indicated Avoid nephrotoxins, ensure adequate renal perfusion  DM II with Hyperglycemia Sliding-scale insulin per protocol Levemir 24 units daily Tube feeding coverage 10 units every 4 hours  Anemia  Follow CBC  Hx HTN CAD On statin, ASA, Plavix Doxazosin  Best practice:  Diet: TF Pain/Anxiety/Delirium protocol (if indicated): PAD protocol  VAP protocol (if indicated): in place DVT prophylaxis: enoxaparin GI prophylaxis: Pepcid Glucose control: SSI, Levemir Mobility: BR Code Status: Full Code  Family Communication: Wife Benjamine Mola) updated by phone 1/16 Disposition: ICU     Critical Care Time: 35  minutes    Baltazar Apo, MD, PhD 09/06/2019, 8:51 AM [facility] [phone] or if no answer [phone]

## 2019-09-06 NOTE — Progress Notes (Addendum)
[id number]: Patient with low BP (40s/20s) despite increasing Neosynephrine drip.  1406: Witnessed HR decrease from 99 to 56 (2nd degree heart block).   1407: PEA arrest. Code started. Epi given x1, Bicarb given x1  1409: ROSC. Bicarb drip ordered. Neosynephrine drip increased to max dose.   1410: Physician spoke with family over the phone and updated them.

## 2019-09-07 LAB — POCT I-STAT 7, (LYTES, BLD GAS, ICA,H+H)
Acid-Base Excess: 13 mmol/L — ABNORMAL HIGH (ref 0.0–2.0)
Bicarbonate: 39.5 mmol/L — ABNORMAL HIGH (ref 20.0–28.0)
Calcium, Ion: 1.13 mmol/L — ABNORMAL LOW (ref 1.15–1.40)
HCT: 26 % — ABNORMAL LOW (ref 39.0–52.0)
Hemoglobin: 8.8 g/dL — ABNORMAL LOW (ref 13.0–17.0)
O2 Saturation: 93 %
Patient temperature: 99.8
Potassium: 3.8 mmol/L (ref 3.5–5.1)
Sodium: 152 mmol/L — ABNORMAL HIGH (ref 135–145)
TCO2: 41 mmol/L — ABNORMAL HIGH (ref 22–32)
pCO2 arterial: 63.5 mmHg — ABNORMAL HIGH (ref 32.0–48.0)
pH, Arterial: 7.405 (ref 7.350–7.450)
pO2, Arterial: 71 mmHg — ABNORMAL LOW (ref 83.0–108.0)

## 2019-09-07 LAB — GLUCOSE, CAPILLARY
Glucose-Capillary: 99 mg/dL (ref 70–99)
Glucose-Capillary: 75 mg/dL (ref 70–99)
Glucose-Capillary: 127 mg/dL — ABNORMAL HIGH (ref 70–99)
Glucose-Capillary: 150 mg/dL — ABNORMAL HIGH (ref 70–99)
Glucose-Capillary: 128 mg/dL — ABNORMAL HIGH (ref 70–99)

## 2019-09-07 LAB — CULTURE, BLOOD (ROUTINE X 2)

## 2019-09-07 NOTE — Plan of Care (Signed)
  Problem: Clinical Measurements: Goal: Cardiovascular complication will be avoided Outcome: Progressing   Problem: Pain Managment: Goal: General experience of comfort will improve Outcome: Progressing   Problem: Education: Goal: Knowledge of General Education information will improve Description: Including pain rating scale, medication(s)/side effects and non-pharmacologic comfort measures Outcome: Not Progressing   Problem: Health Behavior/Discharge Planning: Goal: Ability to manage health-related needs will improve Outcome: Not Progressing   Problem: Clinical Measurements: Goal: Ability to maintain clinical measurements within normal limits will improve Outcome: Not Progressing Goal: Will remain free from infection Outcome: Not Progressing Goal: Diagnostic test results will improve Outcome: Not Progressing Goal: Respiratory complications will improve Outcome: Not Progressing   Problem: Activity: Goal: Risk for activity intolerance will decrease Outcome: Not Progressing   Problem: Nutrition: Goal: Adequate nutrition will be maintained Outcome: Not Progressing   Problem: Coping: Goal: Level of anxiety will decrease Outcome: Not Progressing   Problem: Elimination: Goal: Will not experience complications related to bowel motility Outcome: Not Progressing Goal: Will not experience complications related to urinary retention Outcome: Not Progressing   Problem: Safety: Goal: Ability to remain free from injury will improve Outcome: Not Progressing   Problem: Skin Integrity: Goal: Risk for impaired skin integrity will decrease Outcome: Not Progressing

## 2019-09-07 NOTE — Progress Notes (Signed)
Assisted tele visit to patient with family member.  Kierstan Auer M, RN  

## 2019-09-07 NOTE — Progress Notes (Addendum)
NAME:  [name] [name], MRN:  [id number], DOB:  11/11/1955, LOS: 73 ADMISSION DATE:  09-07-2019, CONSULTATION DATE: 08/21/2019 REFERRING MD: Dr. Waldron Labs, CHIEF COMPLAINT: Hypoxemic respiratory failure  Brief History   64 year old man with CAD, hypertension, diabetes, OSA.  Admitted with COVID-19 pneumonia and hypoxemic respiratory failure 12/30.  Past Medical History  CAD - s/p CABG RBBB HTN HLD DM   Significant Hospital Events   12/30 Admit to ICU on NRB, HHFNC.  Decadron started.  Remdesivir started,Tocilizumab x1  12/31 Empiric abx started 1/02 PCCM consulted. Intubated.  1/03 Requiring NMB, PEEP and FiO2 improving.  Completed remdesivir 1/04 Prone positioning protocol given PF <150.  Recultured for fever. Narrow tachy, given adenosine which identified underlying atrial flutter, loaded with amiodarone and infusion initiated 1/05 Ongoing fever, empiric nosocomial coverage initiated, in spite of this making improvement and FiO2 and PEEP requirements 1/06 Still spiking fevers, did have hemoglobin drop of almost 2 g, checking lower extremity ultrasounds to rule out venous thrombosis 1/07 Still having fever [place] LEs still pending. Failed SBT (no spont effort), during the afternoon hours developed worsening hypoxia in case of dyssynchrony, IV fentanyl added back 1/08 Continues to require more oxygen & peep still having trouble with ventilator synchrony changing sedation regimen to Dilaudid and propofol 1/12 Ongoing intermittent fevers, LE duplex negative. Orders for line change.  1/14 Fever to 101.  Lines out 1/12. 1/15 increasing FiO2 needs, increasing PEEP needs, correlated with decrease sedation and vent dyssynchrony 1/16 brady/PEA arrest x 2 due to acidosis and vagal/rectal pouch being changed  Consults:  PCCM 12/31  Procedures:  ETT 1/2 >>  Right IJ CVC 1/2 >> 1/12  Significant Diagnostic Tests:  Lower extremity ultrasound 1/6 >> negative  Micro Data:  SARS CoV 2 12/27 >>  positive MRSA screen 12/31 >> negative Blood 12/30 >> negative  BCx2 1/4 >> negative  BC 1/13 coag neg staph 1/2 Respiratory culture 1/7 >> few candida albicans Urine culture 1/5 >> negative   Antimicrobials:  Remdesivir 12/30 >> 1/3 Tocilizumab x1, 12/30 Azithromycin 12/31 >> 1/4 Ceftriaxone 12/31 >> 1/4 Cefepime 1/5 >> 1/9 Vancomycin 1/5 >> 1/9 Cefepime 1/14 >>  Vanco 1/14 >>1/17  Interim history/subjective:   Critically ill, intubated Events over the last 24 hours noted, bradycardia arrest x2 yesterday in the setting of severe acidosis, is actually rallied somewhat and now on lower dose of Neo-Synephrine. On Versed 2 mg Afebrile Acceptable urine output Objective   Blood pressure (!) 112/52, pulse 89, temperature 98.9 F (37.2 C), temperature source Axillary, resp. rate (!) 23, height 5\' 10"  (1.778 m), weight 92.1 kg, SpO2 99 %.    Vent Mode: PRVC FiO2 (%):  [80 %-100 %] 80 % Set Rate:  [35 bmp] 35 bmp Vt Set:  [450 mL] 450 mL PEEP:  [16 cmH20] 16 cmH20 Plateau Pressure:  [30 cmH20-33 cmH20] 31 cmH20   Intake/Output Summary (Last 24 hours) at 09/07/2019 1041 Last data filed at 09/07/2019 0800 Gross per 24 hour  Intake 5295.09 ml  Output 1865 ml  Net 3430.09 ml   Filed Weights   09/04/19 0500 09/05/19 0500 09/07/19 0338  Weight: 84.3 kg 89.9 kg 92.1 kg    Examination: General: critically ill appearing adult male lying in bed on vent in NAD HEENT: MM pink/moist, ETT, pupils =/reactive Neuro: sedate , RASS -4 CV: s1s2 rrr, no m/r/g PULM: No accessory muscle use, decreased breath sounds bilateral, intermittent hiccups GI: soft, bsx4 active  Extremities: warm/dry, 1+ generalized edema  Skin:  no rashes or lesions  Chest x-ray 1/16 personally reviewed which shows diffuse bibasilar opacities  Labs show stable hyponatremia, slight increase in creatinine, increase leukocytosis, worsening anemia, stable thrombocytopenia  Resolved Hospital Problem list   Treated  empirically for CAP antibiotics discontinued 1/5 Atrial fibrillation/flutter -for 48 hours and resolved on 1/6  Assessment & Plan:   ARDS due to COVID-19 PNA  Completed remdesivir, decadron.  Continue lung protective ventilation, target PRVC 6 cc/kg FiO2/PEEP remains at 80%/16 Goal plateau pressure less than 30, driving pressure less than 15 Will hold off on paralytic or prone positioning Deep sedation per PAD protocol, goal RASS -4, Diurese VAP prevention order set   Sedation Need in setting of Mechanical Ventilation  Toxic Metabolic Encephalopathy  Hx Depression  PAD protocol, Dilaudid, Versed Melatonin, Lexapro, Wellbutrin Bowel regimen  PEA arrest 1/16 -vagal/acidosis related Shock, suspect related to sedating medications, acidosis, consider sepsis given his fevers although no clear source Wean phenylephrine as tolerated.  Goal MAP 65  Intermittent Fevers Cultures negative except for Candida in sputum, continue cefepime DC vancomycin Check RUE duplex  Mild AKI  Hypernatremia  Metabolic acidosis-improved  Discontinue bicarb Hold Lasix until off pressors Ct free water Replace electrolytes as indicated Avoid nephrotoxins, ensure adequate renal perfusion  DM II with Hyperglycemia Sliding-scale insulin per protocol Levemir 24 units daily Tube feeding coverage 10 units every 4 hours  Anemia  Follow CBC  Hx HTN CAD On statin, ASA, Plavix Doxazosin  Summary-he has rallied somewhat after cardiac arrest on 1/16, DNR issued, however ARDS is persistent after 2 weeks of mechanical ventilation and overall prognosis appears poor with high chance of prolonged mechanical ventilation/tracheostomy situation.  Wife is resistant to withdrawing life support, daughter appears more realistic  Best practice:  Diet: TF Pain/Anxiety/Delirium protocol (if indicated): PAD protocol  VAP protocol (if indicated): in place DVT prophylaxis: enoxaparin GI prophylaxis: Pepcid Glucose  control: SSI, Levemir Mobility: BR Code Status: Full Code  Family Communication: Wife Lanora Manis) 1/16, daughter 1/17 Tabitha Disposition: ICU    The patient is critically ill with multiple organ systems failure and requires high complexity decision making for assessment and support, frequent evaluation and titration of therapies, application of advanced monitoring technologies and extensive interpretation of multiple databases. Critical Care Time devoted to patient care services described in this note independent of APP/resident  time is 35 minutes.    Cyril Mourning MD. Tonny Bollman. [facility]  If no response to pager , please call [phone]   09/07/2019

## 2019-09-07 NOTE — Progress Notes (Signed)
Pharmacy Antibiotic Note  [name] [name] is a 64 y.o. male admitted on 2019-09-07 with COVID-19 pneumonia.  He previously completed courses of ceftriaxone/azithromycin, and cefepime/vanc for prior suspected pneumonia.  Cultues have been no growth, except for few candida albicans in the tracheal aspirate.  Pharmacy has been consulted for Cefepime dosing.  Plan: Continue Cefepime 2g IV q8h Follow up renal function, culture results, and clinical course.   Height: 5\' 10"  (177.8 cm) Weight: 203 lb 0.7 oz (92.1 kg) IBW/kg (Calculated) : 73  Temp (24hrs), Avg:99.3 F (37.4 C), Min:98.9 F (37.2 C), Max:99.8 F (37.7 C)  Recent Labs  Lab 09/03/19 0544 09/04/19 0428 09/05/19 0335 09/06/19 0430 09/06/19 1315  WBC 14.7* 15.1* 12.5* 16.6* 20.1*  CREATININE 0.90 0.90 0.75 0.82 1.08    Estimated Creatinine Clearance: 78.8 mL/min (by C-G formula based on SCr of 1.08 mg/dL).    Allergies  Allergen Reactions  . Hydrocodone-Acetaminophen Swelling    Swelling of hands and feet followed by skin peeling off of hands and feet  . Hydrochlorothiazide Itching    Antimicrobials this admission: Remdesivir 12/30>> 1/3 CTX 12/31 >> 1/4 Azith 12/31>> 1/4 Actemra 12/31  cPlasma 12/31 Actemra 1/1 Cefepime 1/5 >>1/9, resumed 1/14 >  Vanc 1/5 >> 1/9, resumed 1/14 >   Dose adjustments this admission:   Microbiology results: 12/30 BCx: negF  12/31 MRSA PCR neg 1/4 BCx: NGF 1/5 UCx: NGF 1/7 TA >> Few candida  1/13 BC x4:  2/4 bottles (same set) positive for coag neg staph.  Other bottles ngtd   Thank you for allowing pharmacy to be a part of this patient's care.  2/13 PharmD, BCPS Clinical pharmacist phone 7am- 5pm: [phone] 09/07/2019 1:51 PM

## 2019-09-07 NOTE — Progress Notes (Signed)
Spoke with daughter Wyatt Mage on the phone. Gave update. Planned for video call afterwards.

## 2019-09-07 NOTE — Progress Notes (Signed)
Spoke to patient's wife. Gave update. Provided answers to all questions. Provided reassurance and support.

## 2019-09-07 NOTE — Progress Notes (Signed)
Spoke with Daughter Wyatt Mage over the phone and gave update of pt's condition. Provided reassurance and support. Answered all questions and concerns at this time.

## 2019-09-08 ENCOUNTER — Inpatient Hospital Stay (HOSPITAL_COMMUNITY): Payer: BC Managed Care – PPO

## 2019-09-08 DIAGNOSIS — M7989 Other specified soft tissue disorders: Secondary | ICD-10-CM

## 2019-09-08 LAB — CBC
HCT: 28.9 % — ABNORMAL LOW (ref 39.0–52.0)
Hemoglobin: 8.7 g/dL — ABNORMAL LOW (ref 13.0–17.0)
MCH: 32.2 pg (ref 26.0–34.0)
MCHC: 30.1 g/dL (ref 30.0–36.0)
MCV: 107 fL — ABNORMAL HIGH (ref 80.0–100.0)
Platelets: 145 10*3/uL — ABNORMAL LOW (ref 150–400)
RBC: 2.7 MIL/uL — ABNORMAL LOW (ref 4.22–5.81)
RDW: 17.2 % — ABNORMAL HIGH (ref 11.5–15.5)
WBC: 9.2 10*3/uL (ref 4.0–10.5)
nRBC: 0.2 % (ref 0.0–0.2)

## 2019-09-08 LAB — BASIC METABOLIC PANEL
Anion gap: 8 (ref 5–15)
BUN: 109 mg/dL — ABNORMAL HIGH (ref 8–23)
CO2: 34 mmol/L — ABNORMAL HIGH (ref 22–32)
Calcium: 7.9 mg/dL — ABNORMAL LOW (ref 8.9–10.3)
Chloride: 108 mmol/L (ref 98–111)
Creatinine, Ser: 1.46 mg/dL — ABNORMAL HIGH (ref 0.61–1.24)
GFR calc Af Amer: 58 mL/min — ABNORMAL LOW (ref >60)
GFR calc non Af Amer: 50 mL/min — ABNORMAL LOW (ref >60)
Glucose, Bld: 120 mg/dL — ABNORMAL HIGH (ref 70–99)
Potassium: 4.4 mmol/L (ref 3.5–5.1)
Sodium: 150 mmol/L — ABNORMAL HIGH (ref 135–145)

## 2019-09-08 LAB — GLUCOSE, CAPILLARY
Glucose-Capillary: 158 mg/dL — ABNORMAL HIGH (ref 70–99)
Glucose-Capillary: 120 mg/dL — ABNORMAL HIGH (ref 70–99)
Glucose-Capillary: 132 mg/dL — ABNORMAL HIGH (ref 70–99)
Glucose-Capillary: 205 mg/dL — ABNORMAL HIGH (ref 70–99)
Glucose-Capillary: 223 mg/dL — ABNORMAL HIGH (ref 70–99)
Glucose-Capillary: 175 mg/dL — ABNORMAL HIGH (ref 70–99)
Glucose-Capillary: 169 mg/dL — ABNORMAL HIGH (ref 70–99)

## 2019-09-08 LAB — POCT I-STAT 7, (LYTES, BLD GAS, ICA,H+H)
Calcium, Ion: 1.2 mmol/L (ref 1.15–1.40)
HCT: 27 % — ABNORMAL LOW (ref 39.0–52.0)
Hemoglobin: 9.2 g/dL — ABNORMAL LOW (ref 13.0–17.0)
Patient temperature: 100.3
Potassium: 4.6 mmol/L (ref 3.5–5.1)
Sodium: 147 mmol/L — ABNORMAL HIGH (ref 135–145)
pCO2 arterial: >97 mmHg (ref 32.0–48.0)
pH, Arterial: 7.036 — CL (ref 7.350–7.450)
pO2, Arterial: 74 mmHg — ABNORMAL LOW (ref 83.0–108.0)

## 2019-09-08 LAB — PHOSPHORUS: Phosphorus: 3.6 mg/dL (ref 2.5–4.6)

## 2019-09-08 LAB — MAGNESIUM: Magnesium: 2.8 mg/dL — ABNORMAL HIGH (ref 1.7–2.4)

## 2019-09-08 MED ORDER — FUROSEMIDE 10 MG/ML IJ SOLN
40.0000 mg | Freq: Two times a day (BID) | INTRAMUSCULAR | Status: DC
  Administered 2019-09-08 – 2019-09-10 (×4): 40 mg via INTRAVENOUS
  Filled 2019-09-08 (×4): qty 4

## 2019-09-08 NOTE — Progress Notes (Signed)
eLink Physician-Brief Progress Note Patient Name: [name] [name] DOB: 09-24-1955 MRN: [id number]   Date of Service  09/08/2019  HPI/Events of Note  12 beat run of NSVT. Now NSR with rate = 92.   eICU Interventions  Will order: 1. BMP and Mg++ level STAT.      Intervention Category Major Interventions: Arrhythmia - evaluation and management  Lotus Gover Eugene 09/08/2019, 12:06 AM

## 2019-09-08 NOTE — Progress Notes (Signed)
PT Cancellation Note  Patient Details Name: [name] [name] MRN: [id number] DOB: 06-02-1956   Cancelled Treatment:    Reason Eval/Treat Not Completed: Medical issues which prohibited therapy.  Vent settings too high for PT mobility with FiO2 60% and PEEP 16.  PT to continue to monitor for stability and progress as able.   Thanks,  Corinna Capra, PT, DPT  Acute Rehabilitation [phone] pager [phone] office  @ [facility]: [phone]     [name] 09/08/2019, 9:08 AM

## 2019-09-08 NOTE — Progress Notes (Signed)
OT Cancellation    09/08/19 1600  OT Visit Information  Last OT Received On 09/08/19  Reason Eval/Treat Not Completed Patient not medically ready (Sedated; Peep 16)  Luisa Dago, OT/L   Acute OT Clinical Specialist Acute Rehabilitation Services Pager [phone] Office [phone]

## 2019-09-08 NOTE — Progress Notes (Signed)
NAME:  [name] [name], MRN:  [id number], DOB:  11-Aug-1956, LOS: 46 ADMISSION DATE:  2019/08/28, CONSULTATION DATE: 08/21/2019 REFERRING MD: Dr. Waldron Labs, CHIEF COMPLAINT: Hypoxemic respiratory failure  Brief History   64 year old man with CAD, hypertension, diabetes, OSA.  Admitted with COVID-19 pneumonia and hypoxemic respiratory failure 12/30.  Past Medical History  CAD - s/p CABG RBBB HTN HLD DM   Significant Hospital Events   12/30 Admit to ICU on NRB, HHFNC.  Decadron started.  Remdesivir started,Tocilizumab x1  12/31 Empiric abx started 1/02 PCCM consulted. Intubated.  1/03 Requiring NMB, PEEP and FiO2 improving.  Completed remdesivir 1/04 Prone positioning protocol given PF <150.  Recultured for fever. Narrow tachy, given adenosine which identified underlying atrial flutter, loaded with amiodarone and infusion initiated 1/05 Ongoing fever, empiric nosocomial coverage initiated, in spite of this making improvement and FiO2 and PEEP requirements 1/06 Still spiking fevers, did have hemoglobin drop of almost 2 g, checking lower extremity ultrasounds to rule out venous thrombosis 1/07 Still having fever [place] LEs still pending. Failed SBT (no spont effort), during the afternoon hours developed worsening hypoxia in case of dyssynchrony, IV fentanyl added back 1/08 Continues to require more oxygen & peep still having trouble with ventilator synchrony changing sedation regimen to Dilaudid and propofol 1/12 Ongoing intermittent fevers, LE duplex negative. Orders for line change.  1/14 Fever to 101.  Lines out 1/12. 1/15 increasing FiO2 needs, increasing PEEP needs, correlated with decrease sedation and vent dyssynchrony 1/16 brady/PEA arrest x 2 due to acidosis and vagal/rectal pouch being changed  Consults:  PCCM 12/31  Procedures:  ETT 1/2 >>  Right IJ CVC 1/2 >> 1/12  Significant Diagnostic Tests:  Lower extremity ultrasound 1/6 >> negative Chest x-ray 1/16-persistent stable  bilateral interstitial infiltrates.  Micro Data:  SARS CoV 2 12/27 >> positive MRSA screen 12/31 >> negative Blood 12/30 >> negative  BCx2 1/4 >> negative  BC 1/13 coag neg staph 1/2 Respiratory culture 1/7 >> few candida albicans Urine culture 1/5 >> negative   Antimicrobials:  Remdesivir 12/30 >> 1/3 Tocilizumab x1, 12/30 Azithromycin 12/31 >> 1/4 Ceftriaxone 12/31 >> 1/4 Cefepime 1/5 >> 1/9 Vancomycin 1/5 >> 1/9 Cefepime 1/14 >>  Vanco 1/14 >>1/17  Interim history/subjective:   No further episodes of bradycardia or cardiac arrest.  Weaned off vasopressors.  Objective   Blood pressure (!) 131/58, pulse (!) 117, temperature 98.7 F (37.1 C), temperature source Axillary, resp. rate (!) 38, height 5\' 10"  (1.778 m), weight 82.3 kg, SpO2 93 %.    Vent Mode: PRVC FiO2 (%):  [60 %] 60 % Set Rate:  [35 bmp] 35 bmp Vt Set:  [450 mL] 450 mL PEEP:  [14 cmH20-16 cmH20] 14 cmH20 Plateau Pressure:  [26 cmH20-34 cmH20] 27 cmH20   Intake/Output Summary (Last 24 hours) at 09/08/2019 1342 Last data filed at 09/08/2019 0640 Gross per 24 hour  Intake 2338.61 ml  Output 1140 ml  Net 1198.61 ml   Filed Weights   09/05/19 0500 09/07/19 0338 09/08/19 0500  Weight: 89.9 kg 92.1 kg 82.3 kg    Examination: General: critically ill appearing adult male lying in bed on vent in NAD HEENT: MM [name]/moist, ETT, pupils =/reactive Neuro: sedate , RASS -4 CV: s1s2 rrr, no m/r/g PULM: No accessory muscle use, decreased breath sounds bilateral, intermittent hiccups GI: soft, bsx4 active  Extremities: warm/dry, 1+ generalized edema  Skin: no rashes or lesions   Resolved Hospital Problem list   Treated empirically for CAP antibiotics discontinued  1/5 Atrial fibrillation/flutter -for 48 hours and resolved on 1/6  Assessment & Plan:   ARDS due to COVID-19 PNA  Completed remdesivir, decadron.  Continue lung protective ventilation, target PRVC 6 cc/kg FiO2/PEEP remains at 80%/16 Goal plateau  pressure less than 30, driving pressure less than 15 Will hold off on paralytic or prone positioning Deep sedation per PAD protocol, goal RASS -4, Diurese Prognosis remains poor given prolonged critical illness.   Sedation Need in setting of Mechanical Ventilation  Toxic Metabolic Encephalopathy  Hx Depression  PAD protocol, Dilaudid, Versed Melatonin, Lexapro, Wellbutrin Bowel regimen  PEA arrest 1/16 -vagal/acidosis related Shock, now resolved Neurological status unclear. No CPR if arrest again.  Intermittent Fevers Cultures negative except for Candida in sputum, continue cefepime DC vancomycin Check RUE duplex  Mild AKI  Hypernatremia  Metabolic acidosis-improved   DM II with Hyperglycemia Sliding-scale insulin per protocol Levemir 24 units daily Tube feeding coverage 10 units every 4 hours  Anemia  Follow CBC  Hx HTN CAD On statin, ASA, Plavix Doxazosin at home.   Best practice:  Diet: TF high nutritional risk Pain/Anxiety/Delirium protocol (if indicated): Currently on hydromorphone, midazolam infusions.  Continue enteral sedatives as well.  Wean as tolerated VAP protocol (if indicated): in place Ventilator goals: Slow ventilator wean starting with FiO2. Blood pressure goals: Keep MAP greater than 65. Fluid goals: Attempt diuresis to facilitate ventilator weaning. DVT prophylaxis: enoxaparin GI prophylaxis: Pepcid Glucose control: SSI, Levemir -currently euglycemic. Mobility: Bedrest. Code Status: DNR Family Communication: Wife Lanora Manis) 1/16, daughter 1/17 Tabitha updated by Dr. Vassie Loll.  Prognosis is poor. Disposition: ICU    CRITICAL CARE Performed by: Lynnell Catalan   Total critical care time: 40 minutes  Critical care time was exclusive of separately billable procedures and treating other patients.  Critical care was necessary to treat or prevent imminent or life-threatening deterioration.  Critical care was time spent personally by me on the  following activities: development of treatment plan with patient and/or surrogate as well as nursing, discussions with consultants, evaluation of patient's response to treatment, examination of patient, obtaining history from patient or surrogate, ordering and performing treatments and interventions, ordering and review of laboratory studies, ordering and review of radiographic studies, pulse oximetry, re-evaluation of patient's condition and participation in multidisciplinary rounds.  Lynnell Catalan, MD [facility] ICU Physician [facility]  Pager: [phone] Mobile: [phone] After hours: [phone].   09/08/2019

## 2019-09-09 LAB — CULTURE, BLOOD (ROUTINE X 2): Culture: NO GROWTH

## 2019-09-09 LAB — GLUCOSE, CAPILLARY
Glucose-Capillary: 102 mg/dL — ABNORMAL HIGH (ref 70–99)
Glucose-Capillary: 142 mg/dL — ABNORMAL HIGH (ref 70–99)
Glucose-Capillary: 158 mg/dL — ABNORMAL HIGH (ref 70–99)
Glucose-Capillary: 161 mg/dL — ABNORMAL HIGH (ref 70–99)
Glucose-Capillary: 170 mg/dL — ABNORMAL HIGH (ref 70–99)
Glucose-Capillary: 78 mg/dL (ref 70–99)

## 2019-09-09 MED ORDER — LORAZEPAM 2 MG/ML IJ SOLN
2.0000 mg | INTRAMUSCULAR | Status: DC | PRN
  Administered 2019-09-09: 16:00:00 2 mg via INTRAVENOUS
  Filled 2019-09-09: qty 1

## 2019-09-09 MED ORDER — HYDROMORPHONE HCL 1 MG/ML IJ SOLN
1.0000 mg | INTRAMUSCULAR | Status: DC | PRN
  Administered 2019-09-11 – 2019-09-13 (×6): 1 mg via INTRAVENOUS
  Filled 2019-09-09 (×7): qty 1

## 2019-09-09 MED ORDER — OXYCODONE HCL 5 MG PO TABS
10.0000 mg | ORAL_TABLET | Freq: Four times a day (QID) | ORAL | Status: DC
  Administered 2019-09-09 – 2019-09-10 (×4): 10 mg
  Filled 2019-09-09 (×4): qty 2

## 2019-09-09 NOTE — Progress Notes (Addendum)
NAME:  TIRAS BIANCHINI, MRN:  [id number], DOB:  28-Oct-1955, LOS: 20 ADMISSION DATE:  07/31/2019, CONSULTATION DATE: 08/21/2019 REFERRING MD: Dr. Randol Kern, CHIEF COMPLAINT: Hypoxemic respiratory failure  Brief History   64 year old man with CAD, hypertension, diabetes, OSA.  Admitted with COVID-19 pneumonia and hypoxemic respiratory failure 12/30.  Past Medical History  CAD - s/p CABG RBBB HTN HLD DM   Significant Hospital Events   12/30 Admit to ICU on NRB, HHFNC.  Decadron started.  Remdesivir started,Tocilizumab x1  12/31 Empiric abx started 1/02 PCCM consulted. Intubated.  1/03 Requiring NMB, PEEP and FiO2 improving.  Completed remdesivir 1/04 Prone positioning protocol given PF <150.  Recultured for fever. Narrow tachy, given adenosine which identified underlying atrial flutter, loaded with amiodarone and infusion initiated 1/05 Ongoing fever, empiric nosocomial coverage initiated, in spite of this making improvement and FiO2 and PEEP requirements 1/06 Still spiking fevers, did have hemoglobin drop of almost 2 g, checking lower extremity ultrasounds to rule out venous thrombosis 1/07 Still having fever [place] LEs still pending. Failed SBT (no spont effort), during the afternoon hours developed worsening hypoxia in case of dyssynchrony, IV fentanyl added back 1/08 Continues to require more oxygen & peep still having trouble with ventilator synchrony changing sedation regimen to Dilaudid and propofol 1/12 Ongoing intermittent fevers, LE duplex negative. Orders for line change.  1/14 Fever to 101.  Lines out 1/12. 1/15 increasing FiO2 needs, increasing PEEP needs, correlated with decrease sedation and vent dyssynchrony 1/16 brady/PEA arrest x 2 due to acidosis and vagal/rectal pouch being changed 1/18 no further episodes of bradycardia.  Remains sedated.  Consults:  PCCM 12/31  Procedures:  ETT 1/2 >>  Right IJ CVC 1/2 >> 1/12  Significant Diagnostic Tests:  Lower extremity  ultrasound 1/6 >> negative Chest x-ray 1/16-persistent stable bilateral interstitial infiltrates.  Micro Data:  SARS CoV 2 12/27 >> positive MRSA screen 12/31 >> negative Blood 12/30 >> negative  BCx2 1/4 >> negative  BC 1/13 coag neg staph 1/2 Respiratory culture 1/7 >> few candida albicans Urine culture 1/5 >> negative   Antimicrobials:  Remdesivir 12/30 >> 1/3 Tocilizumab x1, 12/30 Azithromycin 12/31 >> 1/4 Ceftriaxone 12/31 >> 1/4 Cefepime 1/5 >> 1/9 Vancomycin 1/5 >> 1/9 Cefepime 1/14 >>  Vanco 1/14 >>1/17  Interim history/subjective:   Remains off vasopressors.  Continues to be deeply sedated.  Objective   Blood pressure (!) 126/49, pulse 84, temperature 98.9 F (37.2 C), temperature source Oral, resp. rate (!) 24, height 5\' 10"  (1.778 m), weight 92.4 kg, SpO2 94 %.    Vent Mode: PRVC FiO2 (%):  [50 %-60 %] 50 % Set Rate:  [35 bmp] 35 bmp Vt Set:  [450 mL] 450 mL PEEP:  [12 cmH20-14 cmH20] 12 cmH20 Plateau Pressure:  [27 cmH20-33 cmH20] 28 cmH20   Intake/Output Summary (Last 24 hours) at 09/09/2019 0857 Last data filed at 09/09/2019 09/11/2019 Gross per 24 hour  Intake 2806.23 ml  Output 2970 ml  Net -163.77 ml   Filed Weights   09/07/19 0338 09/08/19 0500 09/09/19 0500  Weight: 92.1 kg 82.3 kg 92.4 kg    Examination: General: critically ill appearing adult male lying in bed on vent in NAD HEENT: MM pink/moist, ETT in place with no skin breakdown. Neuro: Sedated on fentanyl and midazolam.  Pupils equal and sluggish.  No doll's eyes.  No response to painful stimuli. CV: Heart sounds normal.  Extremities warm and well-perfused.  Moderate peripheral edema. PULM: Tachypneic breathing over the set ventilator  rate.  No dyssynchrony.  Chest is clear to auscultation anteriorly.  No adventitial sounds. GI: soft, bsx4 active  Extremities: warm/dry, 1+ generalized edema  Skin: no rashes or lesions   Resolved Hospital Problem list   Treated empirically for CAP  antibiotics discontinued 1/5 Atrial fibrillation/flutter -for 48 hours and resolved on 1/6 PEA arrest 1/16 -vagal/acidosis related Shock  Assessment & Plan:   Critically ill due to ARDS due to COVID-19 PNA  Improving ventilator settings.  But remains tachypneic likely due to noncompliant lungs. Continue trial of diuresis. Allow patient to wake up. As respiratory rate improves initiate spontaneous breathing trials.  Toxic Metabolic Encephalopathy due to sedation requirements to maintain compliance with mechanical ventilation.  Background history of depression Patient needs to wake up Stop all continuous infusions.  Continue enteral sedatives. Continue home antidepressants.  Intermittent Fevers and leukocytosis-improving Cultures negative Complete empiric course of antibiotics empiric course of antibiotics.  Mild AKI  Hypernatremia  Continue free water Monitor renal function with ongoing diuresis   Best practice:  Diet: TF high nutritional risk Pain/Anxiety/Delirium protocol (if indicated): Currently on hydromorphone, midazolam infusions.  Stop all sedative infusions target RASS -1, 0 VAP protocol (if indicated): in place Ventilator goals: Slow ventilator wean starting with FiO2. Blood pressure goals: Keep MAP greater than 65. Fluid goals: Attempt diuresis to facilitate ventilator weaning. DVT prophylaxis: enoxaparin GI prophylaxis: Pepcid Glucose control: SSI, Levemir -currently euglycemic. Mobility: Bedrest. Home medication reconciliation: Holding home medication for hypertension. Code Status: DNR Family Communication: Wife Benjamine Mola) 1/16, daughter 1/19 Tabitha updated by Dr. Lynetta Mare: Informed her that he has made some signs of progress but he still has a long way to recovery and that ventilator weaning and improvement in his neurological status are key hurdles he must cross. Recovery at this point will take over a month and he needs to have no other  setbacks. Disposition: ICU    CRITICAL CARE Performed by: Kipp Brood   Total critical care time: 40 minutes  Critical care time was exclusive of separately billable procedures and treating other patients.  Critical care was necessary to treat or prevent imminent or life-threatening deterioration.  Critical care was time spent personally by me on the following activities: development of treatment plan with patient and/or surrogate as well as nursing, discussions with consultants, evaluation of patient's response to treatment, examination of patient, obtaining history from patient or surrogate, ordering and performing treatments and interventions, ordering and review of laboratory studies, ordering and review of radiographic studies, pulse oximetry, re-evaluation of patient's condition and participation in multidisciplinary rounds.  Kipp Brood, MD [facility] [place] ICU Physician [place]  Pager: [phone] Mobile: [phone] After hours: [phone].   09/09/2019

## 2019-09-09 NOTE — Progress Notes (Signed)
Wasted 35 mL dilaudid gtt 50mg /1107mL with 80m, RN.

## 2019-09-09 NOTE — Progress Notes (Signed)
NAME:  [name] [name], MRN:  [id number], DOB:  April 22, 1956, LOS: 20 ADMISSION DATE:  2019/09/02, CONSULTATION DATE: 08/21/2019 REFERRING MD: Dr. Randol Kern, CHIEF COMPLAINT: Hypoxemic respiratory failure  Brief History   64 year old man with CAD, hypertension, diabetes, OSA.  Admitted with COVID-19 pneumonia and hypoxemic respiratory failure 12/30.  Past Medical History   Past Medical History:  Diagnosis Date  . Chest pain        . Coronary artery disease    post bypass grafting in 1996-.SVG was stented  to his ramus branch in 2006     . Diabetes mellitus without complication (HCC)    noninsulin -requiring diabetes   . Dyspnea    MYOVIEW STRESS TEST--PERFORMED 04/30/2009--WHICH SHOWED  NEW LATERAL ISCHEMIA.   Marland Kitchen Hyperlipidemia   . Hypertension   . Right bundle branch block   . Sleep apnea    pt has a cpap machine    Significant Hospital Events   12/30 Admit to ICU on NRB, HHFNC.  Decadron started.  Remdesivir started,Tocilizumab x1  12/31 Empiric abx started 1/02 PCCM consulted. Intubated.  1/03 Requiring NMB, PEEP and FiO2 improving.  Completed remdesivir 1/04 Prone positioning protocol given PF <150.  Recultured for fever. Narrow tachy, given adenosine which identified underlying atrial flutter, loaded with amiodarone and infusion initiated 1/05 Ongoing fever, empiric nosocomial coverage initiated, in spite of this making improvement and FiO2 and PEEP requirements 1/06 Still spiking fevers, did have hemoglobin drop of almost 2 g, checking lower extremity ultrasounds to rule out venous thrombosis 1/07 Still having fever [place] LEs still pending. Failed SBT (no spont effort), during the afternoon hours developed worsening hypoxia in case of dyssynchrony, IV fentanyl added back 1/08 Continues to require more oxygen & peep still having trouble with ventilator synchrony changing sedation regimen to Dilaudid and propofol 1/12 Ongoing intermittent fevers, LE duplex negative. Orders for line  change.  1/14 Fever to 101.  Lines out 1/12. 1/15 increasing FiO2 needs, increasing PEEP needs, correlated with decrease sedation and vent dyssynchrony 1/16 brady/PEA arrest x 2 due to acidosis and vagal/rectal pouch being changed 1/18 no further episodes of bradycardia.  Remains sedated.  Consults:  PCCM 12/31  Procedures:  ETT 1/2 >>  Right IJ CVC 1/2 >> 1/12  Significant Diagnostic Tests:  Lower extremity ultrasound 1/6 >> negative Chest x-ray 1/16-persistent stable bilateral interstitial infiltrates.  Micro Data:  SARS CoV 2 12/27 >> positive MRSA screen 12/31 >> negative Blood 12/30 >> negative  BCx2 1/4 >> negative  BC 1/13 coag neg staph 1/2 Respiratory culture 1/7 >> few candida albicans Urine culture 1/5 >> negative   Antimicrobials:  Remdesivir 12/30 >> 1/3 Tocilizumab x1, 12/30 Azithromycin 12/31 >> 1/4 Ceftriaxone 12/31 >> 1/4 Cefepime 1/5 >> 1/9 Vancomycin 1/5 >> 1/9 Cefepime 1/14 >>  Vanco 1/14 >>1/17  Interim history/subjective:   Remains off vasopressors.  Continues to be deeply sedated.  Objective   Blood pressure (!) 137/48, pulse 95, temperature 98.9 F (37.2 C), temperature source Oral, resp. rate (!) 31, height 5\' 10"  (1.778 m), weight 92.4 kg, SpO2 94 %.    Vent Mode: PRVC FiO2 (%):  [50 %-60 %] 50 % Set Rate:  [35 bmp] 35 bmp Vt Set:  [450 mL] 450 mL PEEP:  [10 cmH20-14 cmH20] 10 cmH20 Plateau Pressure:  [22 cmH20-33 cmH20] 22 cmH20   Intake/Output Summary (Last 24 hours) at 09/09/2019 1049 Last data filed at 09/09/2019 0900 Gross per 24 hour  Intake 2876.25 ml  Output 1820 ml  Net 1056.25 ml   Filed Weights   09/07/19 0338 09/08/19 0500 09/09/19 0500  Weight: 92.1 kg 82.3 kg 92.4 kg    Examination: General: critically ill appearing adult male lying in bed on vent in NAD HEENT: MM pink/moist, ETT in place with no skin breakdown. Neuro: Sedated on fentanyl and midazolam.  Pupils equal and sluggish.  No doll's eyes.  No response to  painful stimuli. CV: Heart sounds normal.  Extremities warm and well-perfused.  Moderate peripheral edema. PULM: Tachypneic breathing over the set ventilator rate.  No dyssynchrony.  Chest is clear to auscultation anteriorly.  No adventitial sounds. GI: soft, bsx4 active  Extremities: warm/dry, 1+ generalized edema  Skin: no rashes or lesions   Resolved Hospital Problem list   Treated empirically for CAP antibiotics discontinued 1/5 Atrial fibrillation/flutter -for 48 hours and resolved on 1/6 PEA arrest 1/16 -vagal/acidosis related Shock  Assessment & Plan:   Critically ill due to ARDS due to COVID-19 PNA  Improving ventilator settings.  But remains tachypneic likely due to noncompliant lungs. Continue trial of diuresis. Allow patient to wake up. As respiratory rate improves initiate spontaneous breathing trials.  Toxic Metabolic Encephalopathy due to sedation requirements to maintain compliance with mechanical ventilation.  Background history of depression Patient needs to wake up Stop all continuous infusions.  Continue enteral sedatives. Continue home antidepressants.  Intermittent Fevers and leukocytosis-improving Cultures negative Complete empiric course of antibiotics empiric course of antibiotics.  Mild AKI  Hypernatremia  Continue free water Monitor renal function with ongoing diuresis   Best practice:  Diet: TF high nutritional risk Pain/Anxiety/Delirium protocol (if indicated): Currently on hydromorphone, midazolam infusions.  Stop all sedative infusions target RASS -1, 0 VAP protocol (if indicated): in place Ventilator goals: Slow ventilator wean starting with FiO2. Blood pressure goals: Keep MAP greater than 65. Fluid goals: Attempt diuresis to facilitate ventilator weaning. DVT prophylaxis: enoxaparin GI prophylaxis: Pepcid Glucose control: SSI, Levemir -currently euglycemic. Mobility: Bedrest. Home medication reconciliation: Holding home medication for  hypertension. Code Status: DNR Family Communication: Wife Benjamine Mola) 1/16, daughter 1/19 Tabitha updated by Dr. Lynetta Mare: Informed her that he has made some signs of progress but he still has a long way to recovery and that ventilator weaning and improvement in his neurological status are key hurdles he must cross. Recovery at this point will take over a month and he needs to have no other setbacks. Disposition: ICU    CRITICAL CARE Performed by: Kipp Brood   Total critical care time: 40 minutes  Critical care time was exclusive of separately billable procedures and treating other patients.  Critical care was necessary to treat or prevent imminent or life-threatening deterioration.  Critical care was time spent personally by me on the following activities: development of treatment plan with patient and/or surrogate as well as nursing, discussions with consultants, evaluation of patient's response to treatment, examination of patient, obtaining history from patient or surrogate, ordering and performing treatments and interventions, ordering and review of laboratory studies, ordering and review of radiographic studies, pulse oximetry, re-evaluation of patient's condition and participation in multidisciplinary rounds.  Kipp Brood, MD [facility] ICU Physician [place]  Pager: [phone] Mobile: [phone] After hours: [phone].   09/09/2019

## 2019-09-09 NOTE — Progress Notes (Signed)
PT Cancellation Note  Patient Details Name: [name] [name] MRN: [id number] DOB: February 02, 1956   Cancelled Treatment:    Reason Eval/Treat Not Completed: Medical issues which prohibited therapy.  Vent settings better with FiO2 at 50% and PEEP at 5, however, resting RR is 40 and pt seems to be working hard even on PRVC.  Completely sedated.  PT will continue to follow along for readiness.   Thanks,  Corinna Capra, PT, DPT  Acute Rehabilitation [phone] pager [phone] office  @ [facility]: [phone]     [name] [name] 09/09/2019, 11:51 AM

## 2019-09-09 NOTE — Plan of Care (Signed)

## 2019-09-09 NOTE — Progress Notes (Signed)
Wasted 35 mL dilaudid gtt 50mg /154mL with 80m, RN

## 2019-09-10 LAB — POCT I-STAT 7, (LYTES, BLD GAS, ICA,H+H)
Acid-Base Excess: 8 mmol/L — ABNORMAL HIGH (ref 0.0–2.0)
Bicarbonate: 33.2 mmol/L — ABNORMAL HIGH (ref 20.0–28.0)
Calcium, Ion: 1.18 mmol/L (ref 1.15–1.40)
HCT: 23 % — ABNORMAL LOW (ref 39.0–52.0)
Hemoglobin: 7.8 g/dL — ABNORMAL LOW (ref 13.0–17.0)
O2 Saturation: 96 %
Patient temperature: 100.1
Potassium: 3.6 mmol/L (ref 3.5–5.1)
Sodium: 150 mmol/L — ABNORMAL HIGH (ref 135–145)
TCO2: 35 mmol/L — ABNORMAL HIGH (ref 22–32)
pCO2 arterial: 54.5 mmHg — ABNORMAL HIGH (ref 32.0–48.0)
pH, Arterial: 7.396 (ref 7.350–7.450)
pO2, Arterial: 92 mmHg (ref 83.0–108.0)

## 2019-09-10 LAB — GLUCOSE, CAPILLARY
Glucose-Capillary: 141 mg/dL — ABNORMAL HIGH (ref 70–99)
Glucose-Capillary: 147 mg/dL — ABNORMAL HIGH (ref 70–99)
Glucose-Capillary: 159 mg/dL — ABNORMAL HIGH (ref 70–99)
Glucose-Capillary: 190 mg/dL — ABNORMAL HIGH (ref 70–99)
Glucose-Capillary: 197 mg/dL — ABNORMAL HIGH (ref 70–99)
Glucose-Capillary: 207 mg/dL — ABNORMAL HIGH (ref 70–99)
Glucose-Capillary: 213 mg/dL — ABNORMAL HIGH (ref 70–99)

## 2019-09-10 LAB — CBC WITH DIFFERENTIAL/PLATELET
Abs Immature Granulocytes: 0.09 10*3/uL — ABNORMAL HIGH (ref 0.00–0.07)
Basophils Absolute: 0 10*3/uL (ref 0.0–0.1)
Basophils Relative: 0 %
Eosinophils Absolute: 0.1 10*3/uL (ref 0.0–0.5)
Eosinophils Relative: 2 %
HCT: 26.8 % — ABNORMAL LOW (ref 39.0–52.0)
Hemoglobin: 8 g/dL — ABNORMAL LOW (ref 13.0–17.0)
Immature Granulocytes: 1 %
Lymphocytes Relative: 8 %
Lymphs Abs: 0.7 10*3/uL (ref 0.7–4.0)
MCH: 32.1 pg (ref 26.0–34.0)
MCHC: 29.9 g/dL — ABNORMAL LOW (ref 30.0–36.0)
MCV: 107.6 fL — ABNORMAL HIGH (ref 80.0–100.0)
Monocytes Absolute: 0.4 10*3/uL (ref 0.1–1.0)
Monocytes Relative: 5 %
Neutro Abs: 7.1 10*3/uL (ref 1.7–7.7)
Neutrophils Relative %: 84 %
Platelets: 163 10*3/uL (ref 150–400)
RBC: 2.49 MIL/uL — ABNORMAL LOW (ref 4.22–5.81)
RDW: 18.4 % — ABNORMAL HIGH (ref 11.5–15.5)
WBC: 8.5 10*3/uL (ref 4.0–10.5)
nRBC: 0.7 % — ABNORMAL HIGH (ref 0.0–0.2)

## 2019-09-10 LAB — BASIC METABOLIC PANEL
Anion gap: 12 (ref 5–15)
BUN: 156 mg/dL — ABNORMAL HIGH (ref 8–23)
CO2: 32 mmol/L (ref 22–32)
Calcium: 8.2 mg/dL — ABNORMAL LOW (ref 8.9–10.3)
Chloride: 105 mmol/L (ref 98–111)
Creatinine, Ser: 2.83 mg/dL — ABNORMAL HIGH (ref 0.61–1.24)
GFR calc Af Amer: 26 mL/min — ABNORMAL LOW (ref >60)
GFR calc non Af Amer: 23 mL/min — ABNORMAL LOW (ref >60)
Glucose, Bld: 200 mg/dL — ABNORMAL HIGH (ref 70–99)
Potassium: 3.8 mmol/L (ref 3.5–5.1)
Sodium: 149 mmol/L — ABNORMAL HIGH (ref 135–145)

## 2019-09-10 MED ORDER — OXYCODONE HCL 5 MG PO TABS
5.0000 mg | ORAL_TABLET | Freq: Four times a day (QID) | ORAL | Status: DC
  Administered 2019-09-10 (×2): 5 mg
  Filled 2019-09-10 (×2): qty 1

## 2019-09-10 MED ORDER — ESCITALOPRAM OXALATE 20 MG PO TABS
20.0000 mg | ORAL_TABLET | Freq: Every day | ORAL | Status: DC
  Administered 2019-09-10 – 2019-09-12 (×3): 20 mg
  Filled 2019-09-10 (×5): qty 1

## 2019-09-10 MED ORDER — CLONAZEPAM 1 MG PO TABS
1.0000 mg | ORAL_TABLET | Freq: Two times a day (BID) | ORAL | Status: DC
  Administered 2019-09-10 – 2019-09-11 (×3): 1 mg
  Filled 2019-09-10 (×3): qty 1

## 2019-09-10 MED ORDER — ROSUVASTATIN CALCIUM 10 MG PO TABS: 10.0000 mg | ORAL_TABLET | Freq: Every day | ORAL | Status: DC

## 2019-09-10 MED ORDER — ESCITALOPRAM OXALATE 20 MG PO TABS: 20.0000 mg | ORAL_TABLET | Freq: Every day | ORAL | Status: DC

## 2019-09-10 MED ORDER — ASPIRIN 81 MG PO CHEW: 81.0000 mg | CHEWABLE_TABLET | Freq: Every day | ORAL | Status: DC

## 2019-09-10 MED ORDER — SODIUM CHLORIDE 0.9 % IV SOLN
2.0000 g | Freq: Two times a day (BID) | INTRAVENOUS | Status: DC
  Administered 2019-09-10 – 2019-09-11 (×2): 2 g via INTRAVENOUS
  Filled 2019-09-10 (×2): qty 2

## 2019-09-10 MED ORDER — CLOPIDOGREL BISULFATE 75 MG PO TABS
75.0000 mg | ORAL_TABLET | Freq: Every day | ORAL | Status: DC
  Administered 2019-09-11: 75 mg
  Filled 2019-09-10 (×2): qty 1

## 2019-09-10 MED ORDER — ADULT MULTIVITAMIN W/MINERALS CH
1.0000 | ORAL_TABLET | Freq: Every day | ORAL | Status: DC
  Administered 2019-09-11 – 2019-09-13 (×3): 1
  Filled 2019-09-10 (×3): qty 1

## 2019-09-10 MED ORDER — ASPIRIN 81 MG PO CHEW
81.0000 mg | CHEWABLE_TABLET | Freq: Every day | ORAL | Status: DC
  Administered 2019-09-11 – 2019-09-12 (×2): 81 mg
  Filled 2019-09-10 (×3): qty 1

## 2019-09-10 MED ORDER — ROSUVASTATIN CALCIUM 10 MG PO TABS
10.0000 mg | ORAL_TABLET | Freq: Every day | ORAL | Status: DC
  Administered 2019-09-11 – 2019-09-13 (×3): 10 mg
  Filled 2019-09-10 (×3): qty 1

## 2019-09-10 MED ORDER — METOPROLOL TARTRATE 25 MG PO TABS
12.5000 mg | ORAL_TABLET | Freq: Two times a day (BID) | ORAL | Status: DC
  Administered 2019-09-10 – 2019-09-12 (×6): 12.5 mg
  Filled 2019-09-10 (×7): qty 1

## 2019-09-10 NOTE — Progress Notes (Signed)
Pharmacy Antibiotic Note  [name] [name] is a 64 y.o. male admitted on Sep 16, 2019 with COVID-19 pneumonia.  He previously completed courses of ceftriaxone/azithromycin, and cefepime/vanc for prior suspected pneumonia. Restarted on Cefepime. 2/4 Cultures grew of coag negative staph. Unsure of its clinical significance.   WBC has normalized. Remains afebrile. Of note, SCr has doubled today.   Plan: Decrease Cefepime to 2g IV q12h. To end tomorrow  Follow up renal function, culture results, and clinical course.   Height: 5\' 10"  (177.8 cm) Weight: 203 lb 11.3 oz (92.4 kg) IBW/kg (Calculated) : 73  Temp (24hrs), Avg:99.5 F (37.5 C), Min:98.8 F (37.1 C), Max:100 F (37.8 C)  Recent Labs  Lab 09/05/19 0335 09/06/19 0430 09/06/19 1315 09/08/19 0410 09/10/19 0500  WBC 12.5* 16.6* 20.1* 9.2 8.5  CREATININE 0.75 0.82 1.08 1.46* 2.83*    Estimated Creatinine Clearance: 30.1 mL/min (A) (by C-G formula based on SCr of 2.83 mg/dL (H)).    Allergies  Allergen Reactions  . Hydrocodone-Acetaminophen Swelling    Swelling of hands and feet followed by skin peeling off of hands and feet  . Hydrochlorothiazide Itching    Antimicrobials this admission: Remdesivir 12/30>> 1/3 CTX 12/31 >> 1/4 Azith 12/31>> 1/4 Actemra 12/31  cPlasma 12/31 Actemra 1/1 Cefepime 1/5 >>1/9, resumed 1/14 >  Vanc 1/5 >> 1/9, resumed 1/14 > 1/17  Dose adjustments this admission:   Microbiology results: 12/30 BCx: negF  12/31 MRSA PCR neg 1/4 BCx: NGF 1/5 UCx: NGF 1/7 TA >> Few candida  1/13 BC x4:  2/4 bottles (same set) positive for coag neg staph.  Other bottles ngtd   Thank you for allowing pharmacy to be a part of this patient's care.  2/13, PharmD., BCPS Clinical Pharmacist Clinical phone for 09/10/19 until 5pm: [phone]

## 2019-09-10 NOTE — Progress Notes (Signed)
Spoke with pt's wife.  Explained that he is maintaining on current vent settings, but not at a point of vent weaning.  Also we are trying to lighten his sedation.  Holding lasix due to increased creatinine and sodium.  Coralyn Helling, MD [facility] Pulmonary/Critical Care 09/10/2019, 1:03 PM

## 2019-09-10 NOTE — Progress Notes (Signed)
Received message, daughter had questions about FML paperwork. TCT Tabetha, she will take the forms to the patient's PCP for completion. All questions answered. Abelino Derrick [facility] [place] [facility] Supervisor

## 2019-09-10 NOTE — Progress Notes (Signed)
eLink Physician-Brief Progress Note Patient Name: [name] [name] DOB: Apr 02, 1956 MRN: [id number]   Date of Service  09/10/2019  HPI/Events of Note  Pt needs foley catheter, he has urinary incontinence and an unstageable sacral decubitus ulcer.  eICU Interventions  Foley catheter ordered.        Migdalia Dk 09/10/2019, 7:33 PM

## 2019-09-10 NOTE — Progress Notes (Addendum)
NAME:  [name] [name], MRN:  [id number], DOB:  09-Nov-1955, LOS: 21 ADMISSION DATE:  07/26/2019, CONSULTATION DATE: 08/21/2019 REFERRING MD: Dr. Randol Kern, CHIEF COMPLAINT: Hypoxemic respiratory failure  Brief History   64 yo male presented with dyspnea and hypoxia from COVID 19 pneumonia and required intubation.  Past Medical History  DM, CAD s/p CABG, OSA, HLD, HTN, RBBB  Significant Hospital Events   12/30 Admit to ICU on NRB, HHFNC.  Decadron started.  Remdesivir started,Tocilizumab x1  12/31 Empiric abx started 1/02 PCCM consulted. Intubated.  1/03 Requiring NMB, PEEP and FiO2 improving.  Completed remdesivir 1/04 Prone positioning protocol given PF <150.  Recultured for fever. Narrow tachy, given adenosine which identified underlying atrial flutter, loaded with amiodarone and infusion initiated 1/05 Ongoing fever, empiric nosocomial coverage initiated, in spite of this making improvement and FiO2 and PEEP requirements 1/06 Still spiking fevers, did have hemoglobin drop of almost 2 g, checking lower extremity ultrasounds to rule out venous thrombosis 1/07 Still having fever [place] LEs still pending. Failed SBT (no spont effort), during the afternoon hours developed worsening hypoxia in case of dyssynchrony, IV fentanyl added back 1/08 Continues to require more oxygen & peep still having trouble with ventilator synchrony changing sedation regimen to Dilaudid and propofol 1/12 Ongoing intermittent fevers, LE duplex negative. Orders for line change.  1/14 Fever to 101.  Lines out 1/12. 1/15 increasing FiO2 needs, increasing PEEP needs, correlated with decrease sedation and vent dyssynchrony 1/16 brady/PEA arrest x 2 due to acidosis and vagal/rectal pouch being changed 1/18 no further episodes of bradycardia.  Remains sedated.  Consults:    Procedures:  ETT 1/2 >>  Right IJ CVC 1/2 >> 1/12  Significant Diagnostic Tests:  Lower extremity ultrasound 1/6 >> negative Chest x-ray  1/16-persistent stable bilateral interstitial infiltrates.  Micro Data:  SARS CoV 2 12/27 >> positive MRSA screen 12/31 >> negative Blood 12/30 >> negative  BCx2 1/4 >> negative  Urine 1/5 >> negative Sputum 1/7 >> candida albicans Blood 1/13 >> Coag neg Staph  Antimicrobials:  Remdesivir 12/30 >> 1/3 Tocilizumab x1, 12/30 Azithromycin 12/31 >> 1/4 Ceftriaxone 12/31 >> 1/4 Cefepime 1/5 >> 1/9 Vancomycin 1/5 >> 1/9 Cefepime 1/14 >>  Vanco 1/14 >>1/17  Interim history/subjective:  Off sedation.  Remains on increased PEEP/FiO2.  Objective   Blood pressure (!) 123/51, pulse 87, temperature 100 F (37.8 C), temperature source Axillary, resp. rate (!) 33, height 5\' 10"  (1.778 m), weight 92.4 kg, SpO2 97 %.    Vent Mode: PRVC FiO2 (%):  [50 %-60 %] 50 % Set Rate:  [35 bmp] 35 bmp Vt Set:  [450 mL] 450 mL PEEP:  [10 cmH20-12 cmH20] 10 cmH20 Plateau Pressure:  [24 cmH20-28 cmH20] 28 cmH20   Intake/Output Summary (Last 24 hours) at 09/10/2019 09/12/2019 Last data filed at 09/10/2019 0736 Gross per 24 hour  Intake 1811 ml  Output 1975 ml  Net -164 ml   Filed Weights   09/07/19 0338 09/08/19 0500 09/09/19 0500  Weight: 92.1 kg 82.3 kg 92.4 kg    Examination:  General - sedated Eyes - pupils reactive ENT - ETT in place Cardiac - regular rate/rhythm, no murmur Chest - b/l crackles Abdomen - soft, non tender, + bowel sounds Extremities - no cyanosis, clubbing, or edema Skin - no rashes Neuro - not following commands   Resolved Hospital Problem list   Community acquired pneumonia, A fib/flutter new onset, PEA arrest from vagal stimulation, Septic shock  Assessment & Plan:  Acute hypoxic, hypercapnic respiratory failure with ARDS from COVID 19 pneumonia. - wean PEEP/FiO2 to keep SpO2 85 to 95% - f/u CXR  HCAP. - day 7 of cefepime  Acute metabolic encephalopathy from sepsis and hypoxia. Hx of depression. - RASS goal 0 - hold IV sedation infusions - change klonopin  to 1 mg bid, oxycodone to 5 mg q6h - continue wellbutrin - resume lexapro  AKI from diuresis. Hypernatremia. - baseline creatinine 0.82 from 09/01/19 - hold lasix - f/u BMET - continue free water  Anemia of critical illness. - f/u CBC - transfuse for Hb < 7 or significant bleeding  DM type II poorly controlled. - SSI with levemir and tube feed coverage - hold outpt ozempic, invokamet  Hx of CAD, HTN, HLD. - continue ASA, plavix, cardura - resume crestor, metoprolol - hold outpt cozaar   Best practice:  Diet: tube feeds DVT prophylaxis: enoxaparin GI prophylaxis: Pepcid Mobility: Bedrest. Code Status: DNR Disposition: ICU    Labs:   CMP Latest Ref Rng & Units 09/10/2019 09/10/2019 09/08/2019  Glucose 70 - 99 mg/dL - 200(H) 120(H)  BUN 8 - 23 mg/dL - 156(H) 109(H)  Creatinine 0.61 - 1.24 mg/dL - 2.83(H) 1.46(H)  Sodium 135 - 145 mmol/L 150(H) 149(H) 150(H)  Potassium 3.5 - 5.1 mmol/L 3.6 3.8 4.4  Chloride 98 - 111 mmol/L - 105 108  CO2 22 - 32 mmol/L - 32 34(H)  Calcium 8.9 - 10.3 mg/dL - 8.2(L) 7.9(L)  Total Protein 6.5 - 8.1 g/dL - - -  Total Bilirubin 0.3 - 1.2 mg/dL - - -  Alkaline Phos 38 - 126 U/L - - -  AST 15 - 41 U/L - - -  ALT 0 - 44 U/L - - -    CBC Latest Ref Rng & Units 09/10/2019 09/10/2019 09/08/2019  WBC 4.0 - 10.5 K/uL - 8.5 9.2  Hemoglobin 13.0 - 17.0 g/dL 7.8(L) 8.0(L) 8.7(L)  Hematocrit 39.0 - 52.0 % 23.0(L) 26.8(L) 28.9(L)  Platelets 150 - 400 K/uL - 163 145(L)    ABG    Component Value Date/Time   PHART 7.396 09/10/2019 0535   PCO2ART 54.5 (H) 09/10/2019 0535   PO2ART 92.0 09/10/2019 0535   HCO3 33.2 (H) 09/10/2019 0535   TCO2 35 (H) 09/10/2019 0535   ACIDBASEDEF 2.0 08/25/2019 1819   O2SAT 96.0 09/10/2019 0535    CBG (last 3)  Recent Labs    09/09/19 2350 09/10/19 0357 09/10/19 0709  GLUCAP 190* 197* 141*    CC time 35 minutes  Chesley Mires, MD [place] 09/10/2019, 10:03 AM

## 2019-09-11 ENCOUNTER — Inpatient Hospital Stay (HOSPITAL_COMMUNITY): Payer: BC Managed Care – PPO

## 2019-09-11 LAB — BASIC METABOLIC PANEL
Anion gap: 13 (ref 5–15)
BUN: 130 mg/dL — ABNORMAL HIGH (ref 8–23)
CO2: 29 mmol/L (ref 22–32)
Calcium: 8.1 mg/dL — ABNORMAL LOW (ref 8.9–10.3)
Chloride: 105 mmol/L (ref 98–111)
Creatinine, Ser: 3.67 mg/dL — ABNORMAL HIGH (ref 0.61–1.24)
GFR calc Af Amer: 19 mL/min — ABNORMAL LOW (ref >60)
GFR calc non Af Amer: 16 mL/min — ABNORMAL LOW (ref >60)
Glucose, Bld: 148 mg/dL — ABNORMAL HIGH (ref 70–99)
Potassium: 4 mmol/L (ref 3.5–5.1)
Sodium: 147 mmol/L — ABNORMAL HIGH (ref 135–145)

## 2019-09-11 LAB — CBC
HCT: 26.8 % — ABNORMAL LOW (ref 39.0–52.0)
Hemoglobin: 8.3 g/dL — ABNORMAL LOW (ref 13.0–17.0)
MCH: 32.8 pg (ref 26.0–34.0)
MCHC: 31 g/dL (ref 30.0–36.0)
MCV: 105.9 fL — ABNORMAL HIGH (ref 80.0–100.0)
Platelets: 202 10*3/uL (ref 150–400)
RBC: 2.53 MIL/uL — ABNORMAL LOW (ref 4.22–5.81)
RDW: 18.9 % — ABNORMAL HIGH (ref 11.5–15.5)
WBC: 10.4 10*3/uL (ref 4.0–10.5)
nRBC: 0.5 % — ABNORMAL HIGH (ref 0.0–0.2)

## 2019-09-11 LAB — GLUCOSE, CAPILLARY
Glucose-Capillary: 117 mg/dL — ABNORMAL HIGH (ref 70–99)
Glucose-Capillary: 124 mg/dL — ABNORMAL HIGH (ref 70–99)
Glucose-Capillary: 112 mg/dL — ABNORMAL HIGH (ref 70–99)
Glucose-Capillary: 134 mg/dL — ABNORMAL HIGH (ref 70–99)

## 2019-09-11 MED ORDER — PROPOFOL 10 MG/ML IV BOLUS: 500.0000 mg | Freq: Once | INTRAVENOUS | Status: DC

## 2019-09-11 MED ORDER — PIVOT 1.5 CAL PO LIQD
1000.0000 mL | ORAL | Status: DC
  Administered 2019-09-11 – 2019-09-13 (×3): 1000 mL

## 2019-09-11 MED ORDER — FENTANYL CITRATE (PF) 100 MCG/2ML IJ SOLN: 200.0000 ug | Freq: Once | INTRAMUSCULAR | Status: DC

## 2019-09-11 MED ORDER — SODIUM CHLORIDE 0.9 % IV SOLN
2.0000 g | INTRAVENOUS | Status: AC
  Administered 2019-09-12: 2 g via INTRAVENOUS
  Filled 2019-09-11: qty 2

## 2019-09-11 MED ORDER — MIDAZOLAM HCL 2 MG/2ML IJ SOLN: 5.0000 mg | Freq: Once | INTRAMUSCULAR | Status: DC

## 2019-09-11 MED ORDER — ETOMIDATE 2 MG/ML IV SOLN: 40.0000 mg | Freq: Once | INTRAVENOUS | Status: DC

## 2019-09-11 MED ORDER — FAMOTIDINE 40 MG/5ML PO SUSR
20.0000 mg | Freq: Every day | ORAL | Status: DC
  Administered 2019-09-12: 20 mg
  Filled 2019-09-11 (×2): qty 2.5

## 2019-09-11 MED ORDER — VECURONIUM BROMIDE 10 MG IV SOLR: 10.0000 mg | Freq: Once | INTRAVENOUS | Status: DC

## 2019-09-11 NOTE — Progress Notes (Signed)
Nutrition Follow-up  DOCUMENTATION CODES:   Not applicable  INTERVENTION:   D/C Vital 1.5 and Prostat  Pivot 1.5 @ 70 ml/hr (1680 ml/day) via Cortrak  Provides: 2520 kcal, 157 grams protein, and 1275 ml free water.  Total free water: 3075 ml   Continue Juven BID, each packet provides 80 calories, 8 grams of carbohydrate, 2.5  grams of protein (collagen), 7 grams of L-arginine and 7 grams of L-glutamine; supplement contains CaHMB, Vitamins C, E, B12 and Zinc to promote wound healing  NUTRITION DIAGNOSIS:   Inadequate oral intake related to acute illness, catabolic illness as evidenced by NPO status.  Being addressed via TF  GOAL:   Patient will meet greater than or equal to 90% of their needs  Progressing  MONITOR:   Vent status, Labs, Weight trends, TF tolerance, Skin  REASON FOR ASSESSMENT:   Consult, Ventilator Enteral/tube feeding initiation and management  ASSESSMENT:   64 yo male admitted with acute respiratory failure due to COVID-19 pneumonia requiring intubation. PMH includes DM, HTN, CAD, depression   Per CCM plan for trach.   1/02: Intubated 1/13: Cortrak placed, tip post pyloric  1/16: cardiac arrest s/p CPR, returned supine   Patient is currently intubated on ventilator support MV: 13.5 L/min Temp (24hrs), Avg:99.1 F (37.3 C), Min:98.5 F (36.9 C), Max:99.9 F (37.7 C)   Medications reviewed and include: SS novolog, 10 units novolog every 4 hours, 24 units Levemir daily, miralax, senokotMVI  Free water 300 ml every 4 hrs  Labs: CBGs 117-124-112 Na 147 (H)  Vital 1.5 at 40 ml/hr via Cortrak Pro-Stat 60 mL QID Provides 2240 kcals, 185 g of protein and 730 mL of free water  Diet Order:   Diet Order            Diet NPO time specified  Diet effective midnight              EDUCATION NEEDS:   Not appropriate for education at this time  Skin:  Skin Assessment: Skin Integrity Issues: Skin Integrity Issues:: DTI DTI: bilateral  coccyx Other: MASD: coccyx, scrotum  Last BM:  1/16  Height:   Ht Readings from Last 1 Encounters:  09/04/19 5\' 10"  (1.778 m)    Weight:   Wt Readings from Last 1 Encounters:  09/11/19 93 kg    Ideal Body Weight:     BMI:  Body mass index is 29.42 kg/m.  Estimated Nutritional Needs:   Kcal:  09/07/2019 kcals  Protein:  145-170 g  Fluid:  >/= 2 L   [address] RD, LDN, CNSC [phone] Pager [phone] After Hours Pager

## 2019-09-11 NOTE — Progress Notes (Signed)
Spoke with pt's daughter.  Updated about current status and treatment plan.  Explained that CT head was normal.  Explained that he has AKI, but still making urine.  So plan is for continued monitoring of renal fx.  Explained he will need prolonged time for vent weaning and he is at point that he should have tracheostomy.  Reviewed pros/cons of tracheostomy with her and alternative options.  She is agreeable to proceed with tracheostomy.  She will relay this information to her mother.  Coralyn Helling, MD [facility] Pulmonary/Critical Care 09/11/2019, 1:26 PM

## 2019-09-11 NOTE — Progress Notes (Signed)
Updated wife Lib on pt's night. All questions and concerns answered.

## 2019-09-11 NOTE — Progress Notes (Signed)
Pharmacy Antibiotic Note  [name] [name] is a 64 y.o. male admitted on 08/26/2019 with COVID-19 pneumonia.  He previously completed courses of ceftriaxone/azithromycin, and cefepime/vanc for prior suspected pneumonia. Restarted on Cefepime. 2/4 Cultures grew of coag negative staph. Unsure of its clinical significance.   WBC has normalized. Remains afebrile. Of note, SCr continues to increase.   Plan: Decrease Cefepime to 2g IV q24h. To end tomorrow  Follow up renal function, culture results, and clinical course.   Height: 5\' 10"  (177.8 cm) Weight: 205 lb 0.4 oz (93 kg) IBW/kg (Calculated) : 73  Temp (24hrs), Avg:99.1 F (37.3 C), Min:98.5 F (36.9 C), Max:99.9 F (37.7 C)  Recent Labs  Lab 09/06/19 0430 09/06/19 1315 09/08/19 0410 09/10/19 0500 09/11/19 0325  WBC 16.6* 20.1* 9.2 8.5 10.4  CREATININE 0.82 1.08 1.46* 2.83* 3.67*    Estimated Creatinine Clearance: 23.3 mL/min (A) (by C-G formula based on SCr of 3.67 mg/dL (H)).    Allergies  Allergen Reactions  . Hydrocodone-Acetaminophen Swelling    Swelling of hands and feet followed by skin peeling off of hands and feet  . Hydrochlorothiazide Itching    Antimicrobials this admission: Remdesivir 12/30>> 1/3 CTX 12/31 >> 1/4 Azith 12/31>> 1/4 Actemra 12/31  cPlasma 12/31 Actemra 1/1 Cefepime 1/5 >>1/9, resumed 1/14 >  Vanc 1/5 >> 1/9, resumed 1/14 > 1/17  Dose adjustments this admission:   Microbiology results: 12/30 BCx: negF  12/31 MRSA PCR neg 1/4 BCx: NGF 1/5 UCx: NGF 1/7 TA >> Few candida  1/13 BC x4:  2/4 bottles (same set) positive for coag neg staph.  Other bottles ngtd   Thank you for allowing pharmacy to be a part of this patient's care.  2/13, PharmD., BCPS Clinical Pharmacist Clinical phone for 09/11/19 until 5pm: [phone]

## 2019-09-11 NOTE — Progress Notes (Signed)
NAME:  [name] [name], MRN:  [id number], DOB:  February 07, 1956, LOS: 22 ADMISSION DATE:  09-16-2019, CONSULTATION DATE: 08/21/2019 REFERRING MD: Dr. Randol Kern, CHIEF COMPLAINT: Hypoxemic respiratory failure  Brief History   64 yo male presented with dyspnea and hypoxia from COVID 19 pneumonia and required intubation.  Past Medical History  DM, CAD s/p CABG, OSA, HLD, HTN, RBBB  Significant Hospital Events   12/30 Admit to ICU on NRB, HHFNC.  Decadron started.  Remdesivir started,Tocilizumab x1  12/31 Empiric abx started 1/02 PCCM consulted. Intubated.  1/03 Requiring NMB, PEEP and FiO2 improving.  Completed remdesivir 1/04 Prone positioning protocol given PF <150.  Recultured for fever. Narrow tachy, given adenosine which identified underlying atrial flutter, loaded with amiodarone and infusion initiated 1/05 Ongoing fever, empiric nosocomial coverage initiated, in spite of this making improvement and FiO2 and PEEP requirements 1/06 Still spiking fevers, did have hemoglobin drop of almost 2 g, checking lower extremity ultrasounds to rule out venous thrombosis 1/07 Still having fever [place] LEs still pending. Failed SBT (no spont effort), during the afternoon hours developed worsening hypoxia in case of dyssynchrony, IV fentanyl added back 1/08 Continues to require more oxygen & peep still having trouble with ventilator synchrony changing sedation regimen to Dilaudid and propofol 1/12 Ongoing intermittent fevers, LE duplex negative. Orders for line change.  1/14 Fever to 101.  Lines out 1/12. 1/15 increasing FiO2 needs, increasing PEEP needs, correlated with decrease sedation and vent dyssynchrony 1/16 brady/PEA arrest x 2 due to acidosis and vagal/rectal pouch being changed 1/18 no further episodes of bradycardia.  Remains sedated.  Consults:    Procedures:  ETT 1/2 >>  Right IJ CVC 1/2 >> 1/12  Significant Diagnostic Tests:  Lower extremity ultrasound 1/6 >> negative  Micro Data:  SARS  CoV 2 12/27 >> positive MRSA screen 12/31 >> negative Blood 12/30 >> negative  BCx2 1/4 >> negative  Urine 1/5 >> negative Sputum 1/7 >> candida albicans Blood 1/13 >> Coag neg Staph  Antimicrobials:  Remdesivir 12/30 >> 1/3 Tocilizumab x1, 12/30 Azithromycin 12/31 >> 1/4 Ceftriaxone 12/31 >> 1/4 Cefepime 1/5 >> 1/9 Vancomycin 1/5 >> 1/9 Cefepime 1/14 >>  Vanco 1/14 >>1/17  Interim history/subjective:  Increased RR.  Remains on increased PEEP, FiO2.  Objective   Blood pressure (!) 140/58, pulse (!) 101, temperature 99 F (37.2 C), temperature source Axillary, resp. rate (!) 36, height 5\' 10"  (1.778 m), weight 93 kg, SpO2 92 %.    Vent Mode: PRVC FiO2 (%):  [50 %-60 %] 55 % Set Rate:  [35 bmp] 35 bmp Vt Set:  [450 mL] 450 mL PEEP:  [10 cmH20] 10 cmH20 Plateau Pressure:  [23 cmH20-24 cmH20] 23 cmH20   Intake/Output Summary (Last 24 hours) at 09/11/2019 1046 Last data filed at 09/11/2019 0600 Gross per 24 hour  Intake 1800 ml  Output 990 ml  Net 810 ml   Filed Weights   09/08/19 0500 09/09/19 0500 09/11/19 0104  Weight: 82.3 kg 92.4 kg 93 kg    Examination:  General - sedated Eyes - pupils reactive ENT - ETT in place Cardiac - regular rate/rhythm, no murmur Chest - b/l crackles Abdomen - soft, non tender, + bowel sounds Extremities - no cyanosis, clubbing, or edema Skin - no rashes Neuro - not following commands  CXR (reviewed by me) - Rt > Lt b/l ASD   Resolved Hospital Problem list   Community acquired pneumonia, A fib/flutter new onset, PEA arrest from vagal stimulation, Septic shock  Assessment & Plan:   Acute hypoxic, hypercapnic respiratory failure with ARDS from COVID 19 pneumonia. - goal SpO2 85 to 95% - f/u CXR - will talk to family about trach since he will need prolonged time for vent weaning depending on results of CT head  HCAP. - day 8/10 of cefepime  Acute metabolic encephalopathy from sepsis and hypoxia. Hx of depression. - RASS  goal 0 - hold IV sedation infusions - gradually wean off klonopin and oxycodone as tolerated - continue wellbutrin, lexapro - CT head ordered 09/11/19  AKI from diuresis and ATN after cardiac arrest. Hypernatremia. - baseline creatinine 0.82 from 09/01/19 - hold lasix - f/u BMET - continue free water - no indication for renal replacement therapy at this time >> defer nephrology consult and monitor creatinine trend  Anemia of critical illness. - f/u CBC - transfuse for Hb < 7 or significant bleeding  DM type II poorly controlled. - SSI with levemir and tube feed coverage - hold outpt ozempic, invokamet  Hx of CAD, HTN, HLD. - continue ASA, plavix, cardura, crestor, metoprolol - hold outpt cozaar   Best practice:  Diet: tube feeds DVT prophylaxis: enoxaparin GI prophylaxis: Pepcid Mobility: Bedrest. Code Status: DNR Disposition: ICU    Labs:   CMP Latest Ref Rng & Units 09/11/2019 09/10/2019 09/10/2019  Glucose 70 - 99 mg/dL 148(H) - 200(H)  BUN 8 - 23 mg/dL 130(H) - 156(H)  Creatinine 0.61 - 1.24 mg/dL 3.67(H) - 2.83(H)  Sodium 135 - 145 mmol/L 147(H) 150(H) 149(H)  Potassium 3.5 - 5.1 mmol/L 4.0 3.6 3.8  Chloride 98 - 111 mmol/L 105 - 105  CO2 22 - 32 mmol/L 29 - 32  Calcium 8.9 - 10.3 mg/dL 8.1(L) - 8.2(L)  Total Protein 6.5 - 8.1 g/dL - - -  Total Bilirubin 0.3 - 1.2 mg/dL - - -  Alkaline Phos 38 - 126 U/L - - -  AST 15 - 41 U/L - - -  ALT 0 - 44 U/L - - -    CBC Latest Ref Rng & Units 09/11/2019 09/10/2019 09/10/2019  WBC 4.0 - 10.5 K/uL 10.4 - 8.5  Hemoglobin 13.0 - 17.0 g/dL 8.3(L) 7.8(L) 8.0(L)  Hematocrit 39.0 - 52.0 % 26.8(L) 23.0(L) 26.8(L)  Platelets 150 - 400 K/uL 202 - 163    ABG    Component Value Date/Time   PHART 7.396 09/10/2019 0535   PCO2ART 54.5 (H) 09/10/2019 0535   PO2ART 92.0 09/10/2019 0535   HCO3 33.2 (H) 09/10/2019 0535   TCO2 35 (H) 09/10/2019 0535   ACIDBASEDEF 2.0 08/25/2019 1819   O2SAT 96.0 09/10/2019 0535    CBG (last 3)    Recent Labs    09/10/19 2257 09/11/19 0326 09/11/19 0738  GLUCAP 213* 117* 124*    CC time 33 minutes  Chesley Mires, MD [place] 09/11/2019, 10:46 AM

## 2019-09-12 ENCOUNTER — Inpatient Hospital Stay (HOSPITAL_COMMUNITY): Payer: BC Managed Care – PPO

## 2019-09-12 DIAGNOSIS — I2510 Atherosclerotic heart disease of native coronary artery without angina pectoris: Secondary | ICD-10-CM

## 2019-09-12 DIAGNOSIS — N179 Acute kidney failure, unspecified: Secondary | ICD-10-CM

## 2019-09-12 DIAGNOSIS — Z452 Encounter for adjustment and management of vascular access device: Secondary | ICD-10-CM

## 2019-09-12 LAB — BASIC METABOLIC PANEL
Anion gap: 15 (ref 5–15)
BUN: 211 mg/dL — ABNORMAL HIGH (ref 8–23)
CO2: 27 mmol/L (ref 22–32)
Calcium: 8 mg/dL — ABNORMAL LOW (ref 8.9–10.3)
Chloride: 104 mmol/L (ref 98–111)
Creatinine, Ser: 4.61 mg/dL — ABNORMAL HIGH (ref 0.61–1.24)
GFR calc Af Amer: 14 mL/min — ABNORMAL LOW (ref >60)
GFR calc non Af Amer: 12 mL/min — ABNORMAL LOW (ref >60)
Glucose, Bld: 70 mg/dL (ref 70–99)
Potassium: 5.3 mmol/L — ABNORMAL HIGH (ref 3.5–5.1)
Sodium: 146 mmol/L — ABNORMAL HIGH (ref 135–145)

## 2019-09-12 LAB — CBC
HCT: 26.3 % — ABNORMAL LOW (ref 39.0–52.0)
Hemoglobin: 8 g/dL — ABNORMAL LOW (ref 13.0–17.0)
MCH: 32 pg (ref 26.0–34.0)
MCHC: 30.4 g/dL (ref 30.0–36.0)
MCV: 105.2 fL — ABNORMAL HIGH (ref 80.0–100.0)
Platelets: 225 10*3/uL (ref 150–400)
RBC: 2.5 MIL/uL — ABNORMAL LOW (ref 4.22–5.81)
RDW: 19.3 % — ABNORMAL HIGH (ref 11.5–15.5)
WBC: 8.8 10*3/uL (ref 4.0–10.5)
nRBC: 0.2 % (ref 0.0–0.2)

## 2019-09-12 LAB — RENAL FUNCTION PANEL
Albumin: 1.7 g/dL — ABNORMAL LOW (ref 3.5–5.0)
Anion gap: 13 (ref 5–15)
BUN: 214 mg/dL — ABNORMAL HIGH (ref 8–23)
CO2: 27 mmol/L (ref 22–32)
Calcium: 7.4 mg/dL — ABNORMAL LOW (ref 8.9–10.3)
Chloride: 103 mmol/L (ref 98–111)
Creatinine, Ser: 4.9 mg/dL — ABNORMAL HIGH (ref 0.61–1.24)
GFR calc Af Amer: 13 mL/min — ABNORMAL LOW (ref >60)
GFR calc non Af Amer: 12 mL/min — ABNORMAL LOW (ref >60)
Glucose, Bld: 201 mg/dL — ABNORMAL HIGH (ref 70–99)
Phosphorus: 8.1 mg/dL — ABNORMAL HIGH (ref 2.5–4.6)
Potassium: 5.1 mmol/L (ref 3.5–5.1)
Sodium: 143 mmol/L (ref 135–145)

## 2019-09-12 LAB — GLUCOSE, CAPILLARY
Glucose-Capillary: 63 mg/dL — ABNORMAL LOW (ref 70–99)
Glucose-Capillary: 85 mg/dL (ref 70–99)
Glucose-Capillary: 141 mg/dL — ABNORMAL HIGH (ref 70–99)
Glucose-Capillary: 184 mg/dL — ABNORMAL HIGH (ref 70–99)
Glucose-Capillary: 169 mg/dL — ABNORMAL HIGH (ref 70–99)

## 2019-09-12 MED ORDER — SODIUM ZIRCONIUM CYCLOSILICATE 10 G PO PACK
10.0000 g | PACK | Freq: Once | ORAL | Status: AC
  Administered 2019-09-12: 10 g via ORAL
  Filled 2019-09-12: qty 1

## 2019-09-12 MED ORDER — BETHANECHOL CHLORIDE 10 MG PO TABS
10.0000 mg | ORAL_TABLET | Freq: Two times a day (BID) | ORAL | Status: DC
  Administered 2019-09-12 (×2): 10 mg
  Filled 2019-09-12 (×5): qty 1

## 2019-09-12 MED ORDER — HEPARIN SODIUM (PORCINE) 10000 UNIT/ML IJ SOLN
7500.0000 [IU] | Freq: Three times a day (TID) | INTRAMUSCULAR | Status: DC
  Administered 2019-09-12 – 2019-09-13 (×3): 7500 [IU] via SUBCUTANEOUS
  Filled 2019-09-12 (×3): qty 1

## 2019-09-12 MED ORDER — HEPARIN SODIUM (PORCINE) 10000 UNIT/ML IJ SOLN: 7500.0000 [IU] | Freq: Three times a day (TID) | INTRAMUSCULAR | Status: DC

## 2019-09-12 MED ORDER — PRISMASOL BGK 4/2.5 32-4-2.5 MEQ/L REPLACEMENT SOLN: Status: DC

## 2019-09-12 MED ORDER — DEXTROSE 50 % IV SOLN
50.0000 mL | Freq: Once | INTRAVENOUS | Status: AC
  Administered 2019-09-12: 50 mL via INTRAVENOUS
  Filled 2019-09-12: qty 50

## 2019-09-12 MED ORDER — PRISMASOL BGK 0/2.5 32-2.5 MEQ/L IV SOLN
INTRAVENOUS | Status: DC
  Filled 2019-09-12 (×10): qty 5000

## 2019-09-12 MED ORDER — DEXTROSE 10 % IV SOLN
INTRAVENOUS | Status: DC
  Administered 2019-09-12: 1000 mL via INTRAVENOUS

## 2019-09-12 MED ORDER — HEPARIN SODIUM (PORCINE) 1000 UNIT/ML DIALYSIS
1000.0000 [IU] | INTRAMUSCULAR | Status: DC | PRN
  Administered 2019-09-12 – 2019-09-13 (×2): 2800 [IU] via INTRAVENOUS_CENTRAL
  Filled 2019-09-12: qty 3
  Filled 2019-09-12 (×2): qty 6
  Filled 2019-09-12: qty 3

## 2019-09-12 MED ORDER — HEPARIN SODIUM (PORCINE) 5000 UNIT/ML IJ SOLN: 5000.0000 [IU] | Freq: Three times a day (TID) | INTRAMUSCULAR | Status: DC

## 2019-09-12 MED ORDER — SODIUM CHLORIDE 0.9 % FOR CRRT
INTRAVENOUS_CENTRAL | Status: DC | PRN
  Filled 2019-09-12: qty 1000

## 2019-09-12 NOTE — Progress Notes (Signed)
NAME:  [name] [name], MRN:  [id number], DOB:  [birth year], LOS: 23 ADMISSION DATE:  08/15/2019, CONSULTATION DATE:  12/31 REFERRING MD:  Elgergawy, CHIEF COMPLAINT:  Acute hypoxemic respiratory failure   Brief History   64 yo male presented with dyspnea and hypoxia from COVID 19 pneumonia and required intubation.  Past Medical History  DM, CAD s/p CABG, OSA, HLD, HTN, RBBB  Significant Hospital Events   12/30 Admit to ICU on NRB, HHFNC.  Decadron started.  Remdesivir started,Tocilizumab x1  12/31 Empiric abx started 1/02 PCCM consulted. Intubated.  1/03 Requiring NMB, PEEP and FiO2 improving.  Completed remdesivir 1/04 Prone positioning protocol given PF <150.  Recultured for fever. Narrow tachy, given adenosine which identified underlying atrial flutter, loaded with amiodarone and infusion initiated 1/05 Ongoing fever, empiric nosocomial coverage initiated, in spite of this making improvement and FiO2 and PEEP requirements 1/06 Still spiking fevers, did have hemoglobin drop of almost 2 g, checking lower extremity ultrasounds to rule out venous thrombosis 1/07 Still having fever [place] LEs still pending. Failed SBT (no spont effort), during the afternoon hours developed worsening hypoxia in case of dyssynchrony, IV fentanyl added back 1/08 Continues to require more oxygen & peep still having trouble with ventilator synchrony changing sedation regimen to Dilaudid and propofol 1/12 Ongoing intermittent fevers, LE duplex negative. Orders for line change.  1/14 Fever to 101.  Lines out 1/12. 1/15 increasing FiO2 needs, increasing PEEP needs, correlated with decrease sedation and vent dyssynchrony 1/16 brady/PEA arrest x 2 due to acidosis and vagal/rectal pouch being changed 1/18 no further episodes of bradycardia.  Remains sedated. 1/22 off sedation for 48 hours, unresponsive, BUN 211, tracheostomy cancelled, starting CRRT  Consults:    Procedures:  ETT 1/2 >>  Right IJ CVC 1/2 >>  1/12  Significant Diagnostic Tests:  Lower extremity ultrasound 1/6 >> negative  Micro Data:  SARS CoV 2 12/27 >> positive MRSA screen 12/31 >> negative Blood 12/30 >> negative  BCx2 1/4 >> negative  Urine 1/5 >> negative Sputum 1/7 >> candida albicans Blood 1/13 >> Coag neg Staph  Antimicrobials:  Remdesivir 12/30 >> 1/3 Tocilizumab x1, 12/30 Azithromycin 12/31 >> 1/4 Ceftriaxone 12/31 >> 1/4 Cefepime 1/5 >> 1/9 Vancomycin 1/5 >> 1/9 Cefepime 1/14 >> 1/22 Vanco 1/14 >>1/17   Interim history/subjective:  Remains off vasopressors No response to pain Renal function worse  Objective   Blood pressure 126/66, pulse (!) 110, temperature (!) 97.4 F (36.3 C), temperature source Axillary, resp. rate (!) 36, height 5\' 10"  (1.778 m), weight 94.1 kg, SpO2 92 %.    Vent Mode: PRVC FiO2 (%):  [45 %-60 %] 60 % Set Rate:  [35 bmp] 35 bmp Vt Set:  [450 mL] 450 mL PEEP:  [10 cmH20] 10 cmH20 Plateau Pressure:  [24 cmH20-27 cmH20] 27 cmH20   Intake/Output Summary (Last 24 hours) at 09/12/2019 09/14/2019 Last data filed at 09/12/2019 0600 Gross per 24 hour  Intake 1248 ml  Output 910 ml  Net 338 ml   Filed Weights   09/09/19 0500 09/11/19 0104 09/12/19 0500  Weight: 92.4 kg 93 kg 94.1 kg    Examination:  General:  In bed on vent HENT: NCAT ETT in place PULM: Rhonchi B, vent supported breathing CV: RRR, no mgr GI: BS+, soft, nontender MSK: normal bulk and tone Neuro: no cough, no gag, no response to pain   Resolved Hospital Problem list   Community acquired pneumonia, A fib/flutter new onset, PEA arrest from vagal stimulation, Septic  shock  Assessment & Plan:  ARDS from COVID 19 pneumonia> slow progress Plateau 23 1/22 Continue full vent support per ARDS vent settings Hold off on tracheostomy given persistent severe encephalopathy Wean off PEEP/FiO2 Hold plavix  HCAP Complete cefepime today  Acute toxic metabolic encephalopathy > severe, GCS 3 1/22 Concern for anoxic  brain injury DDx includes uremia CRRT today Hold sedatives If still not waking up next week after several days of CRRT would perform MRI brain and EEG  AKI > worsening, mild hyperkalemia BUN 211 Hypernatremia Monitor BMET and UOP Replace electrolytes as needed Continue free water lokelma today CVVHD? > discuss with renal  Anemia of critical illness Monitor for bleeding Transfuse PRBC for Hgb < 7 gm/dL  DM2 Hypoglycemia 1/22 while NPO SSI + detemir Accuchecks Start D10  History of CAD, hypertension, hyperlipidemia Tele metoprolol  Best practice:  Diet: tube feeding Pain/Anxiety/Delirium protocol (if indicated): RASS target 0, stop oral sedatives VAP protocol (if indicated): yes DVT prophylaxis: change to sub cutaneous heparin GI prophylaxis: famotidine Glucose control: SSI Mobility: bed rest Code Status: full Family Communication: I spoke to his daughter Tab at length today about goals of care, she voiced understanding of his condition and understands we will re-assess need for tracheostomy next week after performing CRRT for several days.   Disposition: remain in ICU  Labs   CBC: Recent Labs  Lab 09/06/19 1315 09/06/19 1324 09/08/19 0410 09/10/19 0500 09/10/19 0535 09/11/19 0325 09/12/19 0304  WBC 20.1*  --  9.2 8.5  --  10.4 8.8  NEUTROABS  --   --   --  7.1  --   --   --   HGB 9.5*   < > 8.7* 8.0* 7.8* 8.3* 8.0*  HCT 33.5*   < > 28.9* 26.8* 23.0* 26.8* 26.3*  MCV 111.7*  --  107.0* 107.6*  --  105.9* 105.2*  PLT 175  --  145* 163  --  202 225   < > = values in this interval not displayed.    Basic Metabolic Panel: Recent Labs  Lab 09/06/19 1315 09/06/19 1324 09/08/19 0410 09/10/19 0500 09/10/19 0535 09/11/19 0325 09/12/19 0304  NA 148*   < > 150* 149* 150* 147* 146*  K 4.6   < > 4.4 3.8 3.6 4.0 5.3*  CL 108  --  108 105  --  105 104  CO2 32  --  34* 32  --  29 27  GLUCOSE 326*  --  120* 200*  --  148* 70  BUN 87*  --  109* 156*  --  130*  211*  CREATININE 1.08  --  1.46* 2.83*  --  3.67* 4.61*  CALCIUM 7.8*  --  7.9* 8.2*  --  8.1* 8.0*  MG  --   --  2.8*  --   --   --   --   PHOS  --   --  3.6  --   --   --   --    < > = values in this interval not displayed.   GFR: Estimated Creatinine Clearance: 18.6 mL/min (A) (by C-G formula based on SCr of 4.61 mg/dL (H)). Recent Labs  Lab 09/08/19 0410 09/10/19 0500 09/11/19 0325 09/12/19 0304  WBC 9.2 8.5 10.4 8.8    Liver Function Tests: No results for input(s): AST, ALT, ALKPHOS, BILITOT, PROT, ALBUMIN in the last 168 hours. No results for input(s): LIPASE, AMYLASE in the last 168 hours. No results for input(s):  AMMONIA in the last 168 hours.  ABG    Component Value Date/Time   PHART 7.396 09/10/2019 0535   PCO2ART 54.5 (H) 09/10/2019 0535   PO2ART 92.0 09/10/2019 0535   HCO3 33.2 (H) 09/10/2019 0535   TCO2 35 (H) 09/10/2019 0535   ACIDBASEDEF 2.0 08/25/2019 1819   O2SAT 96.0 09/10/2019 0535     Coagulation Profile: Recent Labs  Lab 09/06/19 1315  INR 1.3*    Cardiac Enzymes: No results for input(s): CKTOTAL, CKMB, CKMBINDEX, TROPONINI in the last 168 hours.  HbA1C: HB A1C (BAYER DCA - WAIVED)  Date/Time Value Ref Range Status  08/11/2019 08:04 AM 8.3 (H) <7.0 % Final    Comment:                                          Diabetic Adult            <7.0                                       Healthy Adult        4.3 - 5.7                                                           (DCCT/NGSP) American Diabetes Association's Summary of Glycemic Recommendations for Adults with Diabetes: Hemoglobin A1c <7.0%. More stringent glycemic goals (A1c <6.0%) may further reduce complications at the cost of increased risk of hypoglycemia.   05/20/2019 09:09 AM 6.7 <7.0 % Final    Comment:                                          Diabetic Adult            <7.0                                       Healthy Adult        4.3 - 5.7                                                            (DCCT/NGSP) American Diabetes Association's Summary of Glycemic Recommendations for Adults with Diabetes: Hemoglobin A1c <7.0%. More stringent glycemic goals (A1c <6.0%) may further reduce complications at the cost of increased risk of hypoglycemia.    Hgb A1c MFr Bld  Date/Time Value Ref Range Status  08/22/2019 03:15 AM 8.7 (H) 4.8 - 5.6 % Final    Comment:    (NOTE) Pre diabetes:          5.7%-6.4% Diabetes:              >6.4% Glycemic control for   <7.0% adults with diabetes     CBG: Recent Labs  Lab 09/11/19 0326 09/11/19 0738 09/11/19 1223 09/11/19 1647 09/12/19 0356  GLUCAP 117* 124* 112* 134* 63*       Critical care time: 35 minutes      Heber Hagerman, MD [facility] Pager: [phone] Cell: [phone] If no response, call [phone]

## 2019-09-12 NOTE — Progress Notes (Signed)
Spoke to patient's daughter, Wyatt Mage. Gave update and discussed his HD line placement as well as the start of his CRRT.

## 2019-09-12 NOTE — Procedures (Signed)
Central Venous hemodialysis Catheter Insertion Procedure Note LEANDER TOUT [id number] 08/08/1956  Procedure: Insertion of Central Venous Catheter Indications: CRRT  Procedure Details Consent: Risks of procedure as well as the alternatives and risks of each were explained to the (patient/caregiver).  Consent for procedure obtained. Time Out: Verified patient identification, verified procedure, site/side was marked, verified correct patient position, special equipment/implants available, medications/allergies/relevent history reviewed, required imaging and test results available.  Performed Real time [place] used to ID and cannulate vessel   Maximum sterile technique was used including antiseptics, cap, gloves, gown, hand hygiene, mask and sheet. Skin prep: Chlorhexidine; local anesthetic administered A antimicrobial bonded/coated triple lumen catheter was placed in the left internal jugular vein using the Seldinger technique.  Evaluation Blood flow good Complications: No apparent complications Patient did tolerate procedure well. Chest X-ray ordered to verify placement.  CXR: normal.  Shelby Mattocks 09/12/2019, 2:31 PM Simonne Martinet ACNP-BC [facility] Pulmonary/Critical Care Pager # [phone] OR # [phone] if no answer

## 2019-09-12 NOTE — Plan of Care (Signed)
Pt remains unresponsive and code status: DNR. No movement to painful stimuli, pupils reactive to light but sluggish. Afebrile, in NSR/ST. BP stable. On PRVC 60%. Pink tinged inline secretions. No cough or gag, but pt closes mouth when mouth care performed. (non purposeful movement). 1 time PRN dilaudid given for vent synchrony. Didn't work.  Tube feeds paused at midnight for planned trach in AM. Lovenox held as well. Foley in place, minimal output. Amber, no odor. Rectal tube in tact, brown output, see I&O's. Turned left/right q2 to prevent skin breakdown. No other issues noted at this time.

## 2019-09-12 NOTE — Consult Note (Signed)
Reason for Consult: Renal failure Referring Physician:  Dr. Lake Bells  Chief Complaint: Shortness of breath  Assessment/Plan: 1. Renal failure - likely in ATN from PEA arrest and relative hypotension over a 5-6 day period. UOP also has been decreasing significantly and he's becoming more uremic, not waking up despite no sedation. - Will initiate CRRT with CVVHDF - Goal 41m / hr net UF as tolerated and will titrate up as tolerated. 2. Hypernatremia - CRRT will correct and had significant free water clearance. This will decrease also with the lower UOP 3. ARDS - per CCM 4. HCAP - now off Cefepime; only on Vanco for 4 days. 5. DM  HPI: [name] MISKELLis an 64y.o. male HTN HLD DM CAD OSA p/w shortness of breath, NP cough, fever, myalgias and noted to have an O2 sat of 65% on RA. CXR shows b/l opacities and pt tested COVID+ and elevated inflammatory markers. Treated with dexamethasone, remdesivir, Tocilizumab, Azithromycin and Ceftriaxone.   Pt was intubated on 1/2.= with lines changed out 1/12 and increasing FIO2 requirements over the past week + bradycardic PEA x2 on 1/16.  Pt was also on Vanco from 1/14-1/17. No contrast administered on review of the chart but has been on multiple pressor agents. Creatinine earlier in the hospitalization was in the 0.8-1 range but over the past 4 days has been steadily increasing to 211/4.61 He did have relative hypotension 1/12-1/18 req Phenylephrine. UOP also started decreasing significantly on 1/20. No contrast administered.   ROS Pertinent items are noted in HPI.  Chemistry and CBC: Creatinine, Ser  Date/Time Value Ref Range Status  09/12/2019 03:04 AM 4.61 (H) 0.61 - 1.24 mg/dL Final  09/11/2019 03:25 AM 3.67 (H) 0.61 - 1.24 mg/dL Final  09/10/2019 05:00 AM 2.83 (H) 0.61 - 1.24 mg/dL Final  09/08/2019 04:10 AM 1.46 (H) 0.61 - 1.24 mg/dL Final  09/06/2019 01:15 PM 1.08 0.61 - 1.24 mg/dL Final  09/06/2019 04:30 AM 0.82 0.61 - 1.24 mg/dL Final  09/05/2019  03:35 AM 0.75 0.61 - 1.24 mg/dL Final  09/04/2019 04:28 AM 0.90 0.61 - 1.24 mg/dL Final  09/03/2019 05:44 AM 0.90 0.61 - 1.24 mg/dL Final  09/02/2019 05:16 AM 0.83 0.61 - 1.24 mg/dL Final  09/01/2019 05:10 PM 0.82 0.61 - 1.24 mg/dL Final  08/31/2019 05:00 AM 1.00 0.61 - 1.24 mg/dL Final  08/30/2019 05:00 AM 0.91 0.61 - 1.24 mg/dL Final  08/29/2019 05:00 AM 0.86 0.61 - 1.24 mg/dL Final  08/28/2019 03:50 AM 0.98 0.61 - 1.24 mg/dL Final  08/27/2019 04:22 AM 1.07 0.61 - 1.24 mg/dL Final  08/26/2019 04:00 AM 1.31 (H) 0.61 - 1.24 mg/dL Final  08/25/2019 04:56 AM 1.10 0.61 - 1.24 mg/dL Final  08/24/2019 02:58 AM 0.86 0.61 - 1.24 mg/dL Final  08/23/2019 05:30 AM 0.70 0.61 - 1.24 mg/dL Final  08/22/2019 03:15 AM 0.84 0.61 - 1.24 mg/dL Final  08/21/2019 02:10 AM 0.93 0.61 - 1.24 mg/dL Final  07/24/2019 02:11 PM 1.05 0.61 - 1.24 mg/dL Final  08/17/2019 04:37 PM 1.04 0.61 - 1.24 mg/dL Final  08/11/2019 08:00 AM 1.16 0.76 - 1.27 mg/dL Final  05/20/2019 09:10 AM 1.18 0.76 - 1.27 mg/dL Final  01/30/2015 06:08 PM 1.00 0.61 - 1.24 mg/dL Final  09/11/2009 06:08 AM 1.04 0.4 - 1.5 mg/dL Final  11/05/2007 04:25 AM 1.0  Final  11/05/2007 01:09 AM 0.98  Final   Recent Labs  Lab 09/06/19 0430 09/06/19 0430 09/06/19 1315 09/06/19 1315 09/06/19 1324 09/07/19 0903 09/08/19 0410 09/10/19 0500 09/10/19  7588 09/11/19 0325 09/12/19 0304  NA 149*   < > 148*   < > 147* 152* 150* 149* 150* 147* 146*  K 4.6   < > 4.6   < > 4.6 3.8 4.4 3.8 3.6 4.0 5.3*  CL 110  --  108  --   --   --  108 105  --  105 104  CO2 33*  --  32  --   --   --  34* 32  --  29 27  GLUCOSE 131*  --  326*  --   --   --  120* 200*  --  148* 70  BUN 86*  --  87*  --   --   --  109* 156*  --  130* 211*  CREATININE 0.82  --  1.08  --   --   --  1.46* 2.83*  --  3.67* 4.61*  CALCIUM 8.2*  --  7.8*  --   --   --  7.9* 8.2*  --  8.1* 8.0*  PHOS  --   --   --   --   --   --  3.6  --   --   --   --    < > = values in this interval not  displayed.   Recent Labs  Lab 09/08/19 0410 09/08/19 0410 09/10/19 0500 09/10/19 0535 09/11/19 0325 09/12/19 0304  WBC 9.2  --  8.5  --  10.4 8.8  NEUTROABS  --   --  7.1  --   --   --   HGB 8.7*   < > 8.0* 7.8* 8.3* 8.0*  HCT 28.9*   < > 26.8* 23.0* 26.8* 26.3*  MCV 107.0*  --  107.6*  --  105.9* 105.2*  PLT 145*  --  163  --  202 225   < > = values in this interval not displayed.   Liver Function Tests: No results for input(s): AST, ALT, ALKPHOS, BILITOT, PROT, ALBUMIN in the last 168 hours. No results for input(s): LIPASE, AMYLASE in the last 168 hours. No results for input(s): AMMONIA in the last 168 hours. Cardiac Enzymes: No results for input(s): CKTOTAL, CKMB, CKMBINDEX, TROPONINI in the last 168 hours. Iron Studies: No results for input(s): IRON, TIBC, TRANSFERRIN, FERRITIN in the last 72 hours. PT/INR: _0 (inr:5)  Xrays/Other Studies: ) Results for orders placed or performed during the hospital encounter of 08/05/2019 (from the past 48 hour(s))  Glucose, capillary     Status: Abnormal   Collection Time: 09/10/19  4:06 PM  Result Value Ref Range   Glucose-Capillary 159 (H) 70 - 99 mg/dL  Glucose, capillary     Status: Abnormal   Collection Time: 09/10/19  7:57 PM  Result Value Ref Range   Glucose-Capillary 207 (H) 70 - 99 mg/dL  Glucose, capillary     Status: Abnormal   Collection Time: 09/10/19 10:57 PM  Result Value Ref Range   Glucose-Capillary 213 (H) 70 - 99 mg/dL  Basic metabolic panel     Status: Abnormal   Collection Time: 09/11/19  3:25 AM  Result Value Ref Range   Sodium 147 (H) 135 - 145 mmol/L   Potassium 4.0 3.5 - 5.1 mmol/L   Chloride 105 98 - 111 mmol/L   CO2 29 22 - 32 mmol/L   Glucose, Bld 148 (H) 70 - 99 mg/dL   BUN 130 (H) 8 - 23 mg/dL    Comment: RESULTS CONFIRMED BY MANUAL  DILUTION   Creatinine, Ser 3.67 (H) 0.61 - 1.24 mg/dL   Calcium 8.1 (L) 8.9 - 10.3 mg/dL   GFR calc non Af Amer 16 (L) >60 mL/min   GFR calc Af Amer 19 (L)  >60 mL/min   Anion gap 13 5 - 15    Comment: Performed at [facility], [place] [address], [place], [place] [address]  CBC     Status: Abnormal   Collection Time: 09/11/19  3:25 AM  Result Value Ref Range   WBC 10.4 4.0 - 10.5 K/uL   RBC 2.53 (L) 4.22 - 5.81 MIL/uL   Hemoglobin 8.3 (L) 13.0 - 17.0 g/dL   HCT 26.8 (L) 39.0 - 52.0 %   MCV 105.9 (H) 80.0 - 100.0 fL   MCH 32.8 26.0 - 34.0 pg   MCHC 31.0 30.0 - 36.0 g/dL   RDW 18.9 (H) 11.5 - 15.5 %   Platelets 202 150 - 400 K/uL   nRBC 0.5 (H) 0.0 - 0.2 %    Comment: Performed at [facility], [place] [address], [place], [place] [address]  Glucose, capillary     Status: Abnormal   Collection Time: 09/11/19  3:26 AM  Result Value Ref Range   Glucose-Capillary 117 (H) 70 - 99 mg/dL  Glucose, capillary     Status: Abnormal   Collection Time: 09/11/19  7:38 AM  Result Value Ref Range   Glucose-Capillary 124 (H) 70 - 99 mg/dL  Glucose, capillary     Status: Abnormal   Collection Time: 09/11/19 12:23 PM  Result Value Ref Range   Glucose-Capillary 112 (H) 70 - 99 mg/dL  Glucose, capillary     Status: Abnormal   Collection Time: 09/11/19  4:47 PM  Result Value Ref Range   Glucose-Capillary 134 (H) 70 - 99 mg/dL  CBC     Status: Abnormal   Collection Time: 09/12/19  3:04 AM  Result Value Ref Range   WBC 8.8 4.0 - 10.5 K/uL   RBC 2.50 (L) 4.22 - 5.81 MIL/uL   Hemoglobin 8.0 (L) 13.0 - 17.0 g/dL   HCT 26.3 (L) 39.0 - 52.0 %   MCV 105.2 (H) 80.0 - 100.0 fL   MCH 32.0 26.0 - 34.0 pg   MCHC 30.4 30.0 - 36.0 g/dL   RDW 19.3 (H) 11.5 - 15.5 %   Platelets 225 150 - 400 K/uL   nRBC 0.2 0.0 - 0.2 %    Comment: Performed at [facility], [place] [address] [place], [place] [address]  Basic metabolic panel     Status: Abnormal   Collection Time: 09/12/19  3:04 AM  Result Value Ref Range   Sodium 146 (H) 135 - 145 mmol/L   Potassium 5.3 (H) 3.5 - 5.1 mmol/L    Comment: DELTA CHECK NOTED    Chloride 104 98 - 111 mmol/L   CO2 27 22 - 32 mmol/L   Glucose, Bld 70 70 - 99 mg/dL   BUN 211 (H) 8 - 23 mg/dL    Comment: RESULTS CONFIRMED BY MANUAL DILUTION   Creatinine, Ser 4.61 (H) 0.61 - 1.24 mg/dL   Calcium 8.0 (L) 8.9 - 10.3 mg/dL   GFR calc non Af Amer 12 (L) >60 mL/min   GFR calc Af Amer 14 (L) >60 mL/min   Anion gap 15 5 - 15    Comment: Performed at [facility], [place] [address], [place], [place] [address]  Glucose, capillary  Status: Abnormal   Collection Time: 09/12/19  3:56 AM  Result Value Ref Range   Glucose-Capillary 63 (L) 70 - 99 mg/dL  Glucose, capillary     Status: None   Collection Time: 09/12/19  8:00 AM  Result Value Ref Range   Glucose-Capillary 85 70 - 99 mg/dL  Glucose, capillary     Status: Abnormal   Collection Time: 09/12/19 12:02 PM  Result Value Ref Range   Glucose-Capillary 141 (H) 70 - 99 mg/dL   CT HEAD WO CONTRAST  Result Date: 09/11/2019 CLINICAL DATA:  Altered mental status. EXAM: CT HEAD WITHOUT CONTRAST TECHNIQUE: Contiguous axial images were obtained from the base of the skull through the vertex without intravenous contrast. COMPARISON:  None. FINDINGS: Brain: Mild motion artifact over the more inferior images. Ventricles, cisterns and other CSF spaces are normal. There is no mass, mass effect, shift of midline structures or acute hemorrhage. No evidence of acute infarction. Vascular: No hyperdense vessel or unexpected calcification. Skull: Normal. Negative for fracture or focal lesion. Sinuses/Orbits: Orbits are normal symmetric. Paranasal sinuses are clear. There is moderate opacification over the mastoid air cells bilaterally. Other: None. IMPRESSION: 1.  No acute findings. 2.  Moderate opacification over the mastoid air cells bilaterally. Electronically Signed   By: Marin Olp M.D.   On: 09/11/2019 12:24   DG Chest Port 1 View  Result Date: 09/11/2019 CLINICAL DATA:  Respiratory failure, COVID-19 positivity EXAM:  PORTABLE CHEST 1 VIEW COMPARISON:  09/08/2019 FINDINGS: Cardiac shadow is stable. Postsurgical changes are again seen. Endotracheal tube and feeding catheter are noted. Feeding catheter appears coiled within the stomach. Diffuse airspace opacities bilaterally consistent with the given clinical history. The overall appearance is stable from the prior exam. No bony abnormality is noted. IMPRESSION: Diffuse bilateral airspace opacities consistent with the given clinical history Electronically Signed   By: Inez Catalina M.D.   On: 09/11/2019 08:14    PMH:   Past Medical History:  Diagnosis Date  . Chest pain        . Coronary artery disease    post bypass grafting in 1996-.SVG was stented  to his ramus branch in 2006     . Diabetes mellitus without complication (HCC)    noninsulin -requiring diabetes   . Dyspnea    MYOVIEW STRESS TEST--PERFORMED 04/30/2009--WHICH SHOWED  NEW LATERAL ISCHEMIA.   Marland Kitchen Hyperlipidemia   . Hypertension   . Right bundle branch block   . Sleep apnea    pt has a cpap machine    PSH:   Past Surgical History:  Procedure Laterality Date  . ADULT ECHOCARDIOGRAPHY  11/08/2004   The left ventricle is normal size .There is normal left ventricular wall thickness .EJECTION FRACTION =>55%.The LA is mildly dilated .The main PA is imaged to the bicfucation into right and left branches with normal size noted.Based on an assumed RA pressure orf 10 and a end diastolic PR gradient of 1.2 m/sec the PA end diastolic pressure is estimated to be ~16 which is top normal  . CARDIAC CATHETERIZATION  01 /21/2011    REVEALING 75 % IN - STENT RESTENOSIS WITHIN  THE STENTED SEGMENT WHICH I RESTENTED WITH A DRUG ELUTING STENT   . CARDIAC CATHETERIZATION  04/ 24 /2013   THAT SHOWED 95 % IN STENT RESTENOSIS WITHIN THE PREVIOUSLY RESTENTED SEGMENT . iI ELECTED TO TREAT HIM MEDICALLY/ PT ASYMPTOMATIC SINCE  . CARDIOVASCULAR STRESS TEST  SEPT 10 2010   NEW LATERAL ISCHEMIA  . CARDIOVASCULAR  STRESS  TEST  November 01 2009   SHOWED RESOULTION OF HIS ISCHEMIA ABNORMALITY. PT WAS ASYMPTOMATIC  AFTER THAT  . CARDIOVASCULAR STRESS TEST  December 08 2011   MYOVIEW SHOWED NEW ANTEROLATERAL ISCHEMIA (LED TO A CATH)  . Carotid Doppler  11 /23/2013   Summay -Bilateral Bulb ICA's demonstrated a mild amount of fibrous plaquewith no evidence of significant diameter  reduction ,tortuosity or any other vascular abnormality. This is midly abnormal carotid duple doppler eval. When compared to the previous study dated 08/12/2007,there are no significant changes noted.Noted it is recommend that this test be repeated when clinically indicated aque   . COLONOSCOPY  11/2007   Dr. Gala Romney: normal. repeat 11/2017.  Marland Kitchen ESOPHAGEAL DILATION N/A 04/16/2015   Procedure: ESOPHAGEAL DILATION;  Surgeon: Daneil Dolin, MD;  Location: [facility];  Service: Endoscopy;  Laterality: N/A;  . ESOPHAGOGASTRODUODENOSCOPY N/A 01/30/2015   Dr. Wilford Corner: food impaction-foreign body removal, distal esophageal stricture, ulcerated esophagus due to food impaction.   . ESOPHAGOGASTRODUODENOSCOPY  11/2007   Dr. Gala Romney: Schatzki ring s/p dilation, small hh  . ESOPHAGOGASTRODUODENOSCOPY  10/2007   Dr. Laurence Spates: food impaction, friable esophageal ring  . ESOPHAGOGASTRODUODENOSCOPY N/A 04/16/2015   RMR: Distal tandem esophageal ring status pos Maloney dilation. Abnormal distal esophagus. Status post esophageal biopsy. Hiatal hernia. Abnormal gastric mucosa of uncertain significance Status post gastric biopsy.  Marland Kitchen LEFT HEART CATHETERIZATION WITH CORONARY ANGIOGRAM N/A 12/13/2011   Procedure: LEFT HEART CATHETERIZATION WITH CORONARY ANGIOGRAM;  Surgeon: Lorretta Harp, MD;  Location: [facility] CATH LAB;  Service: Cardiovascular;  Laterality: N/A;  . sleep  study report  05/03/2007   The patient has undergone a diagnostic polysomnogram which confirmed the diagnosis of sleep disordered breathing with an AHI of 5.4/hr and 24.91/hr during REM sleep  .  SLEEP STUDY  07/ 06/2007   The (AHI) formerly known as RDI , during the total sleep time  6 h 17 min  was 5.41/ hr and during Rem sleep at 24.91/hr .This places the patient into the mild sleep apnea category . The average oxygen saturation range   . SLEEP STUDY  07 11 2008   (cont-) range during REM was 91 % The lowest oxygen saturation during NON-REM and REM sleep was 86% and 87 % respectively . The patient was obsered snoring loudly    Allergies:  Allergies  Allergen Reactions  . Hydrocodone-Acetaminophen Swelling    Swelling of hands and feet followed by skin peeling off of hands and feet  . Hydrochlorothiazide Itching    Medications:   Prior to Admission medications   Medication Sig Start Date End Date Taking? Authorizing Provider  acetaminophen (TYLENOL 8 HOUR ARTHRITIS PAIN) 650 MG CR tablet Take 650 mg by mouth every 8 (eight) hours as needed for pain or fever.   Yes [provider]  aspirin EC 81 MG tablet Take 81 mg by mouth 2 (two) times daily.    Yes [provider]  buPROPion (WELLBUTRIN SR) 100 MG 12 hr tablet Take 100 mg by mouth 2 (two) times daily.  03/07/19  Yes [provider]  Canagliflozin-metFORMIN HCl (INVOKAMET) 705 708 4205 MG TABS Take 1 tablet by mouth 2 (two) times daily.  04/17/19  Yes [provider]  celecoxib (CELEBREX) 200 MG capsule Take 1 capsule (200 mg total) by mouth daily. With food 05/20/19  Yes Stacks, Cletus Gash, MD  clopidogrel (PLAVIX) 75 MG tablet Take 75 mg by mouth daily.   Yes [provider]  escitalopram (LEXAPRO) 20 MG tablet Take 20 mg by mouth at bedtime.    Yes [provider]  famotidine (PEPCID) 20 MG tablet Take 1 tablet (20 mg total) by mouth 2 (two) times daily. Patient taking differently: Take 20 mg by mouth daily.  05/20/19  Yes Claretta Fraise, MD  fish oil-omega-3 fatty acids 1000 MG capsule Take 1 g by mouth daily.    Yes [provider]  Insulin Degludec 200 UNIT/ML SOPN Inject  30 Units into the skin daily. Patient taking differently: Inject 30 Units into the skin every morning.  08/11/19  Yes Claretta Fraise, MD  losartan (COZAAR) 100 MG tablet Take 100 mg by mouth every evening.  11/28/12  Yes [provider]  metoprolol succinate (TOPROL-XL) 25 MG 24 hr tablet Take 25 mg by mouth at bedtime.    Yes [provider]  Multiple Vitamin (MULITIVITAMIN WITH MINERALS) TABS Take 1 tablet by mouth daily.   Yes [provider]  ondansetron (ZOFRAN ODT) 4 MG disintegrating tablet Take 1 tablet (4 mg total) by mouth every 8 (eight) hours as needed for nausea or vomiting. 08/17/19  Yes Aberman, Druscilla Brownie, PA-C  predniSONE (DELTASONE) 10 MG tablet Take 5 daily for 2 days followed by 4,3,2 and 1 for 2 days each. Patient taking differently: Take 10 mg by mouth See admin instructions. Take 5 daily for 2 days followed by 4,3,2 and 1 for 2 days each. 08/11/19  Yes Claretta Fraise, MD  rosuvastatin (CRESTOR) 10 MG tablet Take 1 tablet (10 mg total) by mouth daily. For cholesterol 05/20/19  Yes Stacks, Cletus Gash, MD  Semaglutide, 1 MG/DOSE, (OZEMPIC, 1 MG/DOSE,) 2 MG/1.5ML SOPN Inject 1 mg into the skin every Monday.  12/11/18  Yes [provider]  Vitamin D, Ergocalciferol, (DRISDOL) 1.25 MG (50000 UT) CAPS capsule Take 1 capsule (50,000 Units total) by mouth every 7 (seven) days. 05/22/19 05/20/20 Yes Claretta Fraise, MD    Discontinued Meds:   Medications Discontinued During This Encounter  Medication Reason  . albuterol (VENTOLIN HFA) 108 (90 Base) MCG/ACT inhaler 2 puff   . heparin injection 5,000 Units   . insulin detemir (LEVEMIR) injection 20 Units   . rocuronium (ZEMURON) injection 80 mg   . ipratropium (ATROVENT HFA) inhaler 2 puff   . guaiFENesin-dextromethorphan (ROBITUSSIN DM) 100-10 MG/5ML syrup 10 mL   . traMADol (ULTRAM) tablet 50 mg   . metoprolol succinate (TOPROL-XL) 24 hr tablet 25 mg   . albuterol (VENTOLIN HFA) 108 (90 Base) MCG/ACT  inhaler 2 puff   . ALPRAZolam (XANAX) tablet 0.25 mg   . escitalopram (LEXAPRO) tablet 20 mg   . insulin aspart (novoLOG) injection 0-5 Units   . insulin aspart (novoLOG) injection 4 Units   . insulin aspart (novoLOG) injection 0-15 Units   . fentaNYL 2581mg in NS 2519m(1086mml) infusion-PREMIX   . midazolam (VERSED) 50 mg/50 mL (1 mg/mL) premix infusion   . famotidine (PEPCID) tablet 20 mg   . chlorhexidine gluconate (MEDLINE KIT) (PERIDEX) 0.12 % solution 15 mL   . aspirin EC tablet 81 mg   . feeding supplement (VITAL HIGH PROTEIN) liquid 1,000 mL   . feeding supplement (PRO-STAT SUGAR FREE 64) liquid 30 mL   . bisacodyl (DULCOLAX) EC tablet 10 mg   . multivitamin with minerals tablet 1 tablet   . multivitamin liquid 15 mL   . ondansetron (ZOFRAN) tablet 4 mg   . insulin aspart (novoLOG) injection 4 Units   . cisatracurium (NIMBEX) 200  mg in sodium chloride 0.9 % 200 mL (1 mg/mL) infusion   . insulin aspart (novoLOG) injection 4 Units   . vecuronium (NORCURON) injection 8 mg   . fentaNYL (SUBLIMAZE) injection 50 mcg   . fentaNYL 2523mg in NS 2571m(1021mml) infusion-PREMIX   . fentaNYL (SUBLIMAZE) bolus via infusion 50 mcg   . midazolam (VERSED) 50 mg/50 mL (1 mg/mL) premix infusion   . midazolam (VERSED) bolus via infusion 1-2 mg   . vancomycin (VANCOREADY) IVPB 1750 mg/350 mL   . amiodarone (NEXTERONE PREMIX) 360-4.14 MG/200ML-% (1.8 mg/mL) IV infusion   . free water 200 mL   . clonazePAM (KLONOPIN) tablet 1 mg   . dexmedetomidine (PRECEDEX) 400 MCG/100ML (4 mcg/mL) infusion   . oxyCODONE (Oxy IR/ROXICODONE) immediate release tablet 10 mg Allergic reaction  . fentaNYL (SUBLIMAZE) injection 50 mcg   . fentaNYL (SUBLIMAZE) injection 50-200 mcg   . fentaNYL 2500m66mn NS 250mL45mmcg57m infusion-PREMIX   . artificial tears (LACRILUBE) ophthalmic ointment 1 application   . insulin aspart (novoLOG) injection 8 Units   . dexamethasone (DECADRON) injection 10 mg   . midazolam  (VERSED) injection 2 mg   . midazolam (VERSED) 50 mg/50 mL (1 mg/mL) premix infusion   . midazolam (VERSED) bolus via infusion 1-2 mg   . propofol (DIPRIVAN) 1000 MG/100ML infusion   . insulin detemir (LEVEMIR) injection 28 Units   . insulin detemir (LEVEMIR) injection 20 Units   . phenylephrine (NEOSYNEPHRINE) 10-0.9 MG/250ML-% infusion   . acetaminophen (TYLENOL) tablet 650 mg   . insulin detemir (LEVEMIR) injection 22 Units   . fentaNYL 2500mcg 32mS 250mL (162m/ml26mfusion-PREMIX   . rosuvastatin (CRESTOR) tablet 10 mg   . escitalopram (LEXAPRO) tablet 20 mg   . vancomycin (VANCOCIN) IVPB 1000 mg/200 mL premix   . sodium bicarbonate 150 mEq in dextrose 5% 1000 mL infusion   . HYDROmorphone (DILAUDID) 50 mg in sodium chloride 0.9 % 100 mL (0.5 mg/mL) infusion   . midazolam (VERSED) 50 mg/50 mL (1 mg/mL) premix infusion   . midazolam (VERSED) bolus via infusion 1-2 mg   . phenylephrine (NEOSYNEPHRINE) 10-0.9 MG/250ML-% infusion   . oxyCODONE (Oxy IR/ROXICODONE) immediate release tablet 5 mg   . artificial tears (LACRILUBE) ophthalmic ointment   . HYDROmorphone (DILAUDID) bolus via infusion 0.5 mg   . ceFEPIme (MAXIPIME) 2 g in sodium chloride 0.9 % 100 mL IVPB   . furosemide (LASIX) injection 40 mg   . clopidogrel (PLAVIX) tablet 75 mg   . aspirin chewable tablet 81 mg   . clonazePAM (KLONOPIN) tablet 2 mg   . oxyCODONE (Oxy IR/ROXICODONE) immediate release tablet 10 mg   . benzonatate (TESSALON) 100 MG capsule   . Chlorphen-Phenyleph-ASA (ALKA-SELTZER PLUS COLD) 2-7.8-325 MG TBEF   . aspirin chewable tablet 81 mg   . escitalopram (LEXAPRO) tablet 20 mg   . rosuvastatin (CRESTOR) tablet 10 mg   . multivitamin liquid 15 mL Duplicate  . ceFEPIme (MAXIPIME) 2 g in sodium chloride 0.9 % 100 mL IVPB   . famotidine (PEPCID) 40 MG/5ML suspension 20 mg   . feeding supplement (PRO-STAT SUGAR FREE 64) liquid 60 mL   . feeding supplement (VITAL 1.5 CAL) liquid 1,000 mL   . clonazePAM  (KLONOPIN) tablet 1 mg   . oxyCODONE (Oxy IR/ROXICODONE) immediate release tablet 5 mg   . Melatonin TABS 3 mg   . enoxaparin (LOVENOX) injection 40 mg   . heparin injection 5,000 Units   . heparin injection 7,500 Units  Social History:  reports that he quit smoking about 24 years ago. He has never used smokeless tobacco. He reports that he does not drink alcohol or use drugs.  Family History:   Family History  Problem Relation Age of Onset  . Diabetes Mother   . Diabetes Brother   . Diabetes Sister   . Colon cancer Neg Hx     Blood pressure 125/68, pulse (!) 123, temperature 100 F (37.8 C), temperature source Oral, resp. rate (!) 35, height 5' 10" (1.778 m), weight 94.1 kg, SpO2 91 %. General appearance: Intubated on vent Head: Normocephalic, without obvious abnormality, atraumatic Eyes: reactive pupils Neck: no adenopathy, no carotid bruit, supple, symmetrical, trachea midline and thyroid not enlarged, symmetric, no tenderness/mass/nodules Back: symmetric, no curvature. ROM normal. No CVA tenderness. Resp: distant BS Cardio: regular rate and rhythm GI: soft, non-tender; bowel sounds normal; no masses,  no organomegaly Extremities: edema 1+ Pulses: 2+ and symmetric Skin: Skin color, texture, turgor normal. No rashes or lesions       LIN, Hunt Oris, MD 09/12/2019, 12:29 PM

## 2019-09-12 NOTE — Plan of Care (Signed)

## 2019-09-13 ENCOUNTER — Other Ambulatory Visit: Payer: Self-pay | Admitting: Family Medicine

## 2019-09-13 DIAGNOSIS — L8990 Pressure ulcer of unspecified site, unspecified stage: Secondary | ICD-10-CM | POA: Insufficient documentation

## 2019-09-13 LAB — RENAL FUNCTION PANEL
Albumin: 1.8 g/dL — ABNORMAL LOW (ref 3.5–5.0)
Anion gap: 12 (ref 5–15)
BUN: 137 mg/dL — ABNORMAL HIGH (ref 8–23)
CO2: 26 mmol/L (ref 22–32)
Calcium: 6.9 mg/dL — ABNORMAL LOW (ref 8.9–10.3)
Chloride: 99 mmol/L (ref 98–111)
Creatinine, Ser: 3.41 mg/dL — ABNORMAL HIGH (ref 0.61–1.24)
GFR calc Af Amer: 21 mL/min — ABNORMAL LOW (ref >60)
GFR calc non Af Amer: 18 mL/min — ABNORMAL LOW (ref >60)
Glucose, Bld: 173 mg/dL — ABNORMAL HIGH (ref 70–99)
Phosphorus: 5.3 mg/dL — ABNORMAL HIGH (ref 2.5–4.6)
Potassium: 4 mmol/L (ref 3.5–5.1)
Sodium: 137 mmol/L (ref 135–145)

## 2019-09-13 LAB — POCT I-STAT 7, (LYTES, BLD GAS, ICA,H+H)
Acid-base deficit: 4 mmol/L — ABNORMAL HIGH (ref 0.0–2.0)
Bicarbonate: 24.3 mmol/L (ref 20.0–28.0)
Calcium, Ion: 0.98 mmol/L — ABNORMAL LOW (ref 1.15–1.40)
HCT: 25 % — ABNORMAL LOW (ref 39.0–52.0)
Hemoglobin: 8.5 g/dL — ABNORMAL LOW (ref 13.0–17.0)
O2 Saturation: 90 %
Patient temperature: 36.5
Potassium: 3.8 mmol/L (ref 3.5–5.1)
Sodium: 138 mmol/L (ref 135–145)
TCO2: 26 mmol/L (ref 22–32)
pCO2 arterial: 65.3 mmHg (ref 32.0–48.0)
pH, Arterial: 7.176 — CL (ref 7.350–7.450)
pO2, Arterial: 73 mmHg — ABNORMAL LOW (ref 83.0–108.0)

## 2019-09-13 LAB — CBC
HCT: 26.2 % — ABNORMAL LOW (ref 39.0–52.0)
Hemoglobin: 8.1 g/dL — ABNORMAL LOW (ref 13.0–17.0)
MCH: 33.6 pg (ref 26.0–34.0)
MCHC: 30.9 g/dL (ref 30.0–36.0)
MCV: 108.7 fL — ABNORMAL HIGH (ref 80.0–100.0)
Platelets: 258 10*3/uL (ref 150–400)
RBC: 2.41 MIL/uL — ABNORMAL LOW (ref 4.22–5.81)
RDW: 19.4 % — ABNORMAL HIGH (ref 11.5–15.5)
WBC: 14.7 10*3/uL — ABNORMAL HIGH (ref 4.0–10.5)
nRBC: 0.5 % — ABNORMAL HIGH (ref 0.0–0.2)

## 2019-09-13 LAB — MAGNESIUM: Magnesium: 3 mg/dL — ABNORMAL HIGH (ref 1.7–2.4)

## 2019-09-13 LAB — TROPONIN I (HIGH SENSITIVITY): Troponin I (High Sensitivity): 682 ng/L (ref <18)

## 2019-09-13 LAB — GLUCOSE, CAPILLARY
Glucose-Capillary: 159 mg/dL — ABNORMAL HIGH (ref 70–99)
Glucose-Capillary: 161 mg/dL — ABNORMAL HIGH (ref 70–99)
Glucose-Capillary: 168 mg/dL — ABNORMAL HIGH (ref 70–99)

## 2019-09-13 MED ORDER — CALCIUM GLUCONATE-NACL 1-0.675 GM/50ML-% IV SOLN: 1.0000 g | Freq: Once | INTRAVENOUS | Status: DC

## 2019-09-13 MED ORDER — PRISMASOL BGK 4/2.5 32-4-2.5 MEQ/L IV SOLN: INTRAVENOUS | Status: DC

## 2019-09-13 MED ORDER — STERILE WATER FOR INJECTION IV SOLN
INTRAVENOUS | Status: DC
  Filled 2019-09-13 (×2): qty 850

## 2019-09-13 MED ORDER — NOREPINEPHRINE 4 MG/250ML-% IV SOLN
0.0000 ug/min | INTRAVENOUS | Status: DC
  Administered 2019-09-13: 10 ug/min via INTRAVENOUS
  Filled 2019-09-13: qty 250

## 2019-09-13 MED ORDER — NOREPINEPHRINE 4 MG/250ML-% IV SOLN
INTRAVENOUS | Status: AC
  Administered 2019-09-13: 10:00:00 1 ug/min
  Filled 2019-09-13: qty 250

## 2019-09-13 MED ORDER — DARBEPOETIN ALFA 40 MCG/0.4ML IJ SOSY
40.0000 ug | PREFILLED_SYRINGE | Freq: Once | INTRAMUSCULAR | Status: AC
  Administered 2019-09-13: 10:00:00 40 ug via INTRAVENOUS
  Filled 2019-09-13: qty 0.4

## 2019-09-13 MED FILL — Medication: Qty: 1 | Status: AC

## 2019-09-15 ENCOUNTER — Other Ambulatory Visit: Payer: Self-pay | Admitting: Family Medicine

## 2019-09-22 NOTE — Progress Notes (Signed)
NAME:  [name] [name], MRN:  [id number], DOB:  04/16/1956, LOS: 24 ADMISSION DATE:  13-Sep-2019, CONSULTATION DATE:  12/31 REFERRING MD:  Elgergawy, CHIEF COMPLAINT:  Acute hypoxemic respiratory failure   Brief History   64 yo male presented with dyspnea and hypoxia from COVID 19 pneumonia and required intubation.  Past Medical History  DM, CAD s/p CABG, OSA, HLD, HTN, RBBB  Significant Hospital Events   12/30 Admit to ICU on NRB, HHFNC.  Decadron started.  Remdesivir started,Tocilizumab x1  12/31 Empiric abx started 1/02 PCCM consulted. Intubated.  1/03 Requiring NMB, PEEP and FiO2 improving.  Completed remdesivir 1/04 Prone positioning protocol given PF <150.  Recultured for fever. Narrow tachy, given adenosine which identified underlying atrial flutter, loaded with amiodarone and infusion initiated 1/05 Ongoing fever, empiric nosocomial coverage initiated, in spite of this making improvement and FiO2 and PEEP requirements 1/06 Still spiking fevers, did have hemoglobin drop of almost 2 g, checking lower extremity ultrasounds to rule out venous thrombosis 1/07 Still having fever [place] LEs still pending. Failed SBT (no spont effort), during the afternoon hours developed worsening hypoxia in case of dyssynchrony, IV fentanyl added back 1/08 Continues to require more oxygen & peep still having trouble with ventilator synchrony changing sedation regimen to Dilaudid and propofol 1/12 Ongoing intermittent fevers, LE duplex negative. Orders for line change.  1/14 Fever to 101.  Lines out 1/12. 1/15 increasing FiO2 needs, increasing PEEP needs, correlated with decrease sedation and vent dyssynchrony 1/16 brady/PEA arrest x 2 due to acidosis and vagal/rectal pouch being changed 1/18 no further episodes of bradycardia.  Remains sedated. 1/22 off sedation for 48 hours, unresponsive, BUN 211, tracheostomy cancelled, starting CRRT  Consults:    Procedures:  ETT 1/2 >>  Right IJ CVC 1/2 >> 1/12   Significant Diagnostic Tests:  Lower extremity ultrasound 1/6 >> negative  Micro Data:  SARS CoV 2 12/27 >> positive MRSA screen 12/31 >> negative Blood 12/30 >> negative  BCx2 1/4 >> negative  Urine 1/5 >> negative Sputum 1/7 >> candida albicans Blood 1/13 >> Coag neg Staph  Antimicrobials:  Remdesivir 12/30 >> 1/3 Tocilizumab x1, 12/30 Azithromycin 12/31 >> 1/4 Ceftriaxone 12/31 >> 1/4 Cefepime 1/5 >> 1/9 Vancomycin 1/5 >> 1/9 Cefepime 1/14 >> 1/22 Vanco 1/14 >>1/17   Interim history/subjective:   Increasing dyssynchrony and hypoxemia overnight Received dilaudid in AM Ectopy increased Not on vasopressors   Objective   Blood pressure (!) 102/59, pulse 85, temperature 97.7 F (36.5 C), temperature source Oral, resp. rate (!) 39, height 5\' 10"  (1.778 m), weight 94 kg, SpO2 93 %.    Vent Mode: PRVC FiO2 (%):  [60 %-65 %] 65 % Set Rate:  [35 bmp] 35 bmp Vt Set:  [450 mL] 450 mL PEEP:  [10 cmH20] 10 cmH20 Plateau Pressure:  [21 cmH20-24 cmH20] 24 cmH20   Intake/Output Summary (Last 24 hours) at 09/06/2019 09/15/2019 Last data filed at 09/07/2019 0700 Gross per 24 hour  Intake 1828.84 ml  Output 1613 ml  Net 215.84 ml   Filed Weights   09/11/19 0104 09/12/19 0500 08/28/2019 0356  Weight: 93 kg 94.1 kg 94 kg    Examination:  General:  In bed on vent HENT: NCAT ETT in place PULM: Rhonchi bilaterally, vent supported breathing CV: RRR, no mgr GI: BS+, soft, nontender MSK: normal bulk and tone Neuro: sedated on vent     Resolved Hospital Problem list   Community acquired pneumonia, A fib/flutter new onset, PEA arrest from vagal  stimulation, Septic shock  Assessment & Plan:  ARDS from COVID 19 pneumonia> worsening overnight Continue full vent support per ARDS vent settings Wean PEEP/FiO2 in AM Repeat CXR  HCAP Monitor off cefepime  Sudden severe shock at 10AM on 1/23: unclear etiology, bleed, acidosis, cardiac? 12 lead stat Troponin stat Cbc stat abg stat  Start levophed  Acute toxic metabolic encephalopathy > severe, GCS 3 1/22 Concern for anoxic brain injury DDx includes uremia Hold sedatives as much as possible Will need EEG/MRI if doesn't wake up over next few days  AKI > worsening, mild hyperkalemia BUN 211 Hypernatremia Monitor BMET and UOP Replace electrolytes as needed Decrease free water Hold CVVHD 1/23 as in sudden severe shock  Anemia of critical illness Monitor for bleeding Transfuse PRBC for Hgb < 7 gm/dL  DM2 SSI + detemir accuchecks  History of CAD, hypertension, hyperlipidemia Tele Hold metoprolol  1/23 considering the advanced nature of his multi-organ failure, prolonged ICU illness without significant improvement, cardiac arrest over a week ago, his prognosis is terrible.  I don't think he will survive this.  Will treat with levophed today but it is unlikely that another round of prolonged resuscitation will be medically beneficial so will not escalate beyond one vasopressor.    Best practice:  Diet: tube feeding Pain/Anxiety/Delirium protocol (if indicated): RASS target 0, stop oral sedatives VAP protocol (if indicated): yes DVT prophylaxis: change to sub cutaneous heparin GI prophylaxis: famotidine Glucose control: SSI Mobility: bed rest Code Status: full Family Communication: Tabitha updated bedside by phone on 1/23 after sudden change in his hemodynamics.   Disposition: remain in ICU  Labs   CBC: Recent Labs  Lab 09/06/19 1315 09/06/19 1324 09/08/19 0410 09/10/19 0500 09/10/19 0535 09/11/19 0325 09/12/19 0304  WBC 20.1*  --  9.2 8.5  --  10.4 8.8  NEUTROABS  --   --   --  7.1  --   --   --   HGB 9.5*   < > 8.7* 8.0* 7.8* 8.3* 8.0*  HCT 33.5*   < > 28.9* 26.8* 23.0* 26.8* 26.3*  MCV 111.7*  --  107.0* 107.6*  --  105.9* 105.2*  PLT 175  --  145* 163  --  202 225   < > = values in this interval not displayed.    Basic Metabolic Panel: Recent Labs  Lab 09/08/19 0410 09/08/19 0410  09/10/19 0500 09/10/19 0500 09/10/19 0535 09/11/19 0325 09/12/19 0304 09/12/19 1635 2019/10/05 0410  NA 150*   < > 149*   < > 150* 147* 146* 143 137  K 4.4   < > 3.8   < > 3.6 4.0 5.3* 5.1 4.0  CL 108   < > 105  --   --  105 104 103 99  CO2 34*   < > 32  --   --  29 27 27 26   GLUCOSE 120*   < > 200*  --   --  148* 70 201* 173*  BUN 109*   < > 156*  --   --  130* 211* 214* 137*  CREATININE 1.46*   < > 2.83*  --   --  3.67* 4.61* 4.90* 3.41*  CALCIUM 7.9*   < > 8.2*  --   --  8.1* 8.0* 7.4* 6.9*  MG 2.8*  --   --   --   --   --   --   --  3.0*  PHOS 3.6  --   --   --   --   --   --  8.1* 5.3*   < > = values in this interval not displayed.   GFR: Estimated Creatinine Clearance: 25.2 mL/min (A) (by C-G formula based on SCr of 3.41 mg/dL (H)). Recent Labs  Lab 09/08/19 0410 09/10/19 0500 09/11/19 0325 09/12/19 0304  WBC 9.2 8.5 10.4 8.8    Liver Function Tests: Recent Labs  Lab 09/12/19 1635 09/17/2019 0410  ALBUMIN 1.7* 1.8*   No results for input(s): LIPASE, AMYLASE in the last 168 hours. No results for input(s): AMMONIA in the last 168 hours.  ABG    Component Value Date/Time   PHART 7.396 09/10/2019 0535   PCO2ART 54.5 (H) 09/10/2019 0535   PO2ART 92.0 09/10/2019 0535   HCO3 33.2 (H) 09/10/2019 0535   TCO2 35 (H) 09/10/2019 0535   ACIDBASEDEF 2.0 08/25/2019 1819   O2SAT 96.0 09/10/2019 0535     Coagulation Profile: Recent Labs  Lab 09/06/19 1315  INR 1.3*    Cardiac Enzymes: No results for input(s): CKTOTAL, CKMB, CKMBINDEX, TROPONINI in the last 168 hours.  HbA1C: HB A1C (BAYER DCA - WAIVED)  Date/Time Value Ref Range Status  08/11/2019 08:04 AM 8.3 (H) <7.0 % Final    Comment:                                          Diabetic Adult            <7.0                                       Healthy Adult        4.3 - 5.7                                                           (DCCT/NGSP) American Diabetes Association's Summary of Glycemic  Recommendations for Adults with Diabetes: Hemoglobin A1c <7.0%. More stringent glycemic goals (A1c <6.0%) may further reduce complications at the cost of increased risk of hypoglycemia.   05/20/2019 09:09 AM 6.7 <7.0 % Final    Comment:                                          Diabetic Adult            <7.0                                       Healthy Adult        4.3 - 5.7                                                           (DCCT/NGSP) American Diabetes Association's Summary of Glycemic Recommendations for Adults with Diabetes: Hemoglobin A1c <7.0%. More stringent glycemic goals (A1c <6.0%) may further  reduce complications at the cost of increased risk of hypoglycemia.    Hgb A1c MFr Bld  Date/Time Value Ref Range Status  08/22/2019 03:15 AM 8.7 (H) 4.8 - 5.6 % Final    Comment:    (NOTE) Pre diabetes:          5.7%-6.4% Diabetes:              >6.4% Glycemic control for   <7.0% adults with diabetes     CBG: Recent Labs  Lab 09/12/19 1202 09/12/19 1700 09/12/19 1953 09/12/19 2356 09/17/2019 0355  GLUCAP 141* 184* 169* 161* 168*       Critical care time: 45 minutes      Heber Little Sturgeon, MD [facility] Pager: [phone] Cell: [phone] If no response, call [phone]

## 2019-09-22 NOTE — Plan of Care (Signed)
  Problem: Clinical Measurements: Goal: Will remain free from infection Outcome: Progressing   Problem: Nutrition: Goal: Adequate nutrition will be maintained Outcome: Progressing   Problem: Pain Managment: Goal: General experience of comfort will improve Outcome: Progressing   Problem: Safety: Goal: Ability to remain free from injury will improve Outcome: Progressing   Problem: Skin Integrity: Goal: Risk for impaired skin integrity will decrease Outcome: Progressing   Problem: Education: Goal: Knowledge of General Education information will improve Description: Including pain rating scale, medication(s)/side effects and non-pharmacologic comfort measures Outcome: Not Progressing   Problem: Health Behavior/Discharge Planning: Goal: Ability to manage health-related needs will improve Outcome: Not Progressing   Problem: Clinical Measurements: Goal: Ability to maintain clinical measurements within normal limits will improve Outcome: Not Progressing Goal: Diagnostic test results will improve Outcome: Not Progressing Goal: Respiratory complications will improve Outcome: Not Progressing Goal: Cardiovascular complication will be avoided Outcome: Not Progressing   Problem: Activity: Goal: Risk for activity intolerance will decrease Outcome: Not Progressing   Problem: Coping: Goal: Level of anxiety will decrease Outcome: Not Progressing   Problem: Elimination: Goal: Will not experience complications related to bowel motility Outcome: Not Progressing Goal: Will not experience complications related to urinary retention Outcome: Not Progressing

## 2019-09-22 NOTE — Death Summary Note (Signed)
DEATH SUMMARY   Patient Details  Name: [name] [name] MRN: [id number] DOB: [birth year]  Admission/Discharge Information   Admit Date:  09/18/19  Date of Death: Date of Death: October 12, 2019  Time of Death: Time of Death: 01/04/1111  Length of Stay: Jan 11, 2023  Referring Physician: Mechele Claude, MD   Reason(s) for Hospitalization  COVID 19  Diagnoses  Preliminary cause of death:   COVID Jan 06, 2023 Secondary Diagnoses (including complications and co-morbidities):  Active Problems:   CAD, CABG Jan 04, 1995. SVG-RI PCI '06, 1/11. Cath 4/13 with ISR- medical Rx only   HTN (hypertension)   Dyslipidemia   Diabetes mellitus, Type 2 NIDDM   COVID-19   Acute respiratory failure (HCC)   ARDS (adult respiratory distress syndrome) (HCC)   Acute metabolic encephalopathy   Encounter for central line placement   Acute renal failure (HCC)   Pressure injury of skin   Brief Hospital Course (including significant findings, care, treatment, and services provided and events leading to death)  [name] [name] is a 64 yo male presented with dyspnea and hypoxia from COVID 19 pneumonia and required intubation.  Past Medical History  DM, CAD s/p CABG, OSA, HLD, HTN, RBBB  Significant Hospital Events   September 18, 2023 Admit to ICU on NRB, HHFNC. Decadron started. Remdesivir started,Tocilizumab x1  12/31 Empiric abx started 1/02 PCCM consulted. Intubated.  1/03 Requiring NMB, PEEP and FiO2 improving. Completed remdesivir 1/04 Prone positioning protocol given PF <150. Recultured for fever. Narrow tachy, given adenosine which identified underlying atrial flutter, loaded with amiodarone and infusion initiated 1/05 Ongoing fever, empiric nosocomial coverage initiated, in spite of this making improvement and FiO2 and PEEP requirements 1/06 Still spiking fevers, did have hemoglobin drop of almost 2 g, checking lower extremity ultrasounds to rule out venous thrombosis 1/07 Still having fever [place] LEs still pending. Failed SBT (no spont effort),  during the afternoon hours developed worsening hypoxia in case of dyssynchrony, IV fentanyl added back 1/08 Continues to require more oxygen & peep still having trouble with ventilator synchrony changing sedation regimen to Dilaudid and propofol 1/12 Ongoing intermittent fevers, LE duplex negative. Orders for line change.  1/14 Fever to 101. Lines out 1/12. 1/15 increasing FiO2 needs, increasing PEEP needs, correlated with decrease sedation and vent dyssynchrony 1/16 brady/PEA arrest x 2 due to acidosis and vagal/rectal pouch being changed 1/18 no further episodes of bradycardia. Remains sedated. 1/22 off sedation for 48 hours, unresponsive, BUN 211, tracheostomy cancelled, starting CRRT  On 10/11/2022 he developed the sudden onset of hypotension and bradycardia which was refractory to levophed, epinephrine and bicarbonate infusions.  His code status was DNR.  Family contacted and were able to speak to him at the time of his death.    Pertinent Labs and Studies  Significant Diagnostic Studies DG Abd 1 View  Result Date: 08/25/2019 CLINICAL DATA:  Status post feeding tube placement. EXAM: ABDOMEN - 1 VIEW COMPARISON:  None. FINDINGS: Feeding tube is in place with its tip in the distal stomach directed toward the duodenum. The exam is otherwise negative. IMPRESSION: As above. Electronically Signed   By: Drusilla Kanner M.D.   On: 08/25/2019 13:46   DG Abd 1 View  Result Date: 08/24/2019 CLINICAL DATA:  Initial evaluation for feeding tube placement. EXAM: ABDOMEN - 1 VIEW COMPARISON:  None. FINDINGS: Enteric tube in place with tip seen overlying the distal stomach, side hole well beyond the GE junction. Visualized bowel gas pattern is nonobstructive. Scattered interstitial opacities noted within the visualized lungs with probable left  basilar atelectasis. Degenerative changes noted within the lower lumbar spine. IMPRESSION: 1. Enteric tube in place with tip overlying the distal stomach, side hole well  beyond the GE junction. 2. Nonobstructive bowel gas pattern. Electronically Signed   By: Rise Mu M.D.   On: 08/24/2019 20:18   CT HEAD WO CONTRAST  Result Date: 09/11/2019 CLINICAL DATA:  Altered mental status. EXAM: CT HEAD WITHOUT CONTRAST TECHNIQUE: Contiguous axial images were obtained from the base of the skull through the vertex without intravenous contrast. COMPARISON:  None. FINDINGS: Brain: Mild motion artifact over the more inferior images. Ventricles, cisterns and other CSF spaces are normal. There is no mass, mass effect, shift of midline structures or acute hemorrhage. No evidence of acute infarction. Vascular: No hyperdense vessel or unexpected calcification. Skull: Normal. Negative for fracture or focal lesion. Sinuses/Orbits: Orbits are normal symmetric. Paranasal sinuses are clear. There is moderate opacification over the mastoid air cells bilaterally. Other: None. IMPRESSION: 1.  No acute findings. 2.  Moderate opacification over the mastoid air cells bilaterally. Electronically Signed   By: Elberta Fortis M.D.   On: 09/11/2019 12:24   DG Chest Port 1 View  Result Date: 09/12/2019 CLINICAL DATA:  Check central line placement EXAM: PORTABLE CHEST 1 VIEW COMPARISON:  09/12/2019 FINDINGS: Cardiac shadow is stable. Postsurgical changes are again seen. Endotracheal tube and feeding catheter are again noted. A new left jugular temporary dialysis catheter is noted with catheter tip at the cavoatrial junction. No pneumothorax is seen. Persistent airspace opacity is noted stable from the recent exam. IMPRESSION: No pneumothorax following central line placement. Stable airspace opacities consistent with the given clinical history. Electronically Signed   By: Alcide Clever M.D.   On: 09/12/2019 11:40   DG Chest Port 1 View  Result Date: 09/12/2019 CLINICAL DATA:  Respiratory failure. COVID-19 pneumonia. EXAM: PORTABLE CHEST 1 VIEW COMPARISON:  Radiographs 09/11/2019 and 09/08/2019.  FINDINGS: 0557 hours. The endotracheal and feeding tubes are in stable position. The heart size and mediastinal contours are stable post median sternotomy and CABG. Right greater than left fused bilateral airspace opacities have slightly improved in the interval. Veiling opacities at both lung bases are stable, consistent with bilateral pleural effusions. No pneumothorax. IMPRESSION: Slight improvement in bilateral airspace opacities. Stable pleural effusions. Electronically Signed   By: Carey Bullocks M.D.   On: 09/12/2019 08:06   DG Chest Port 1 View  Result Date: 09/11/2019 CLINICAL DATA:  Respiratory failure, COVID-19 positivity EXAM: PORTABLE CHEST 1 VIEW COMPARISON:  09/08/2019 FINDINGS: Cardiac shadow is stable. Postsurgical changes are again seen. Endotracheal tube and feeding catheter are noted. Feeding catheter appears coiled within the stomach. Diffuse airspace opacities bilaterally consistent with the given clinical history. The overall appearance is stable from the prior exam. No bony abnormality is noted. IMPRESSION: Diffuse bilateral airspace opacities consistent with the given clinical history Electronically Signed   By: Alcide Clever M.D.   On: 09/11/2019 08:14   DG Chest Port 1 View  Result Date: 09/08/2019 CLINICAL DATA:  64 year old male with a history of respiratory failure EXAM: PORTABLE CHEST 1 VIEW COMPARISON:  09/06/2019 FINDINGS: Cardiomediastinal silhouette unchanged in size and contour. Surgical changes of median sternotomy and CABG. Endotracheal tube appears to terminate at the clavicular heads, unchanged from prior. Enteric feeding tube projects over the mediastinum and coiled in the stomach, terminating out of the field of view. Reticulonodular opacities of the bilateral lungs, similar distribution and severity to the comparison. No pneumothorax. No large pleural effusion. No  displaced fracture IMPRESSION: Unchanged appearance of the chest x-ray with reticulonodular opacities  of the bilateral lungs compatible with multifocal pneumonia. Unchanged enteric tube and endotracheal tube Electronically Signed   By: Corrie Mckusick D.O.   On: 09/08/2019 08:02   DG Chest Port 1 View  Result Date: 09/06/2019 CLINICAL DATA:  COVID-19 positivity, check endotracheal tube placement EXAM: PORTABLE CHEST 1 VIEW COMPARISON:  09/03/2019 FINDINGS: Endotracheal tube and feeding catheter are noted in satisfactory position. Cardiac shadow is stable. Postsurgical changes are again seen. Diffuse bibasilar opacities are noted consistent with the given clinical history. These are increased from the prior exam. No bony abnormality is noted. IMPRESSION: Increased opacities in the bases bilaterally consistent with the given clinical history. Tubes and lines as described in satisfactory position. Electronically Signed   By: Inez Catalina M.D.   On: 09/06/2019 03:15   DG Chest Port 1 View  Result Date: 09/03/2019 CLINICAL DATA:  Acute respiratory failure with hypoxia. COVID-19. EXAM: PORTABLE CHEST 1 VIEW 4:31 p.m. COMPARISON:  09/03/2019 at 9:03 a.m. and 09/02/2019 FINDINGS: Endotracheal tube is in good position 5.4 cm above the carina. Feeding tube tip below the diaphragm. Hazy bilateral pulmonary infiltrates are essentially unchanged, primarily at the lung bases. Heart size and vascularity are normal. CABG. No acute bone abnormality. IMPRESSION: Endotracheal tube in good position. No change in the faint bilateral pulmonary infiltrates. Electronically Signed   By: Lorriane Shire M.D.   On: 09/03/2019 16:53   DG CHEST PORT 1 VIEW  Result Date: 09/03/2019 CLINICAL DATA:  64 year old male with history of acute respiratory failure. COVID-19 patient. EXAM: PORTABLE CHEST 1 VIEW COMPARISON:  Chest x-ray 09/02/2019. FINDINGS: Patchy ill-defined opacities are again noted throughout the mid to lower lungs bilaterally (left greater than right), with slightly improved aeration compared to yesterday's examination. No  definite pleural effusions. No evidence of pulmonary edema. Heart size is normal. Upper mediastinal contours are within normal limits. Aortic atherosclerosis. Status post median sternotomy for CABG including LIMA. Feeding tube extending into the upper abdomen. IMPRESSION: 1. Persistent multilobar bilateral pneumonia with slightly improved aeration compared to yesterday's examination. 2. Aortic atherosclerosis. Electronically Signed   By: Vinnie Langton M.D.   On: 09/03/2019 09:19   DG Chest Port 1 View  Result Date: 09/02/2019 CLINICAL DATA:  Acute respiratory failure with hypoxia. EXAM: PORTABLE CHEST 1 VIEW COMPARISON:  Chest radiograph 08/29/2019 FINDINGS: The carina is poorly delineated on the current examination. There has been slight interval retraction of the ET tube which now terminates at the superior margin of the clavicular heads. An enteric tube passes below level of left hemidiaphragm with tip excluded from the field of view. Unchanged position of a right IJ approach central venous catheter with tip in the caudal SVC. Prior median sternotomy. Cardiomediastinal silhouette unchanged. Similar appearance of bibasilar ill-defined airspace disease. No sizable pleural effusion or evidence of pneumothorax. No acute bony abnormality. IMPRESSION: Support apparatus as described. Of note, there has been slight interval retraction of the ET tube, now terminating at the superior margin of the clavicular heads. Bibasilar airspace disease without significant interval change. Electronically Signed   By: Kellie Simmering DO   On: 09/02/2019 07:20   DG Chest Port 1 View  Result Date: 08/29/2019 CLINICAL DATA:  Acute respiratory failure. EXAM: PORTABLE CHEST 1 VIEW COMPARISON:  08/28/2019 FINDINGS: Endotracheal tube tip is 4 cm above the carina. Soft feeding tube enters the abdomen. Right internal jugular central line tip in the SVC 3 cm above the  right atrium. Lower lobe predominant diffuse pulmonary density  persists. This may be improving slightly. No dense consolidation or lobar collapse. No visible effusion. IMPRESSION: Persistent but possibly slightly improved widespread bilateral lower lobe opacity. Lines and tubes well positioned. Electronically Signed   By: Paulina Fusi M.D.   On: 08/29/2019 11:51   DG Chest Port 1 View  Result Date: 08/28/2019 CLINICAL DATA:  Acute respiratory failure EXAM: PORTABLE CHEST 1 VIEW COMPARISON:  Radiograph 08/25/2019 FINDINGS: *Endotracheal tube terminates 4.4 cm from the carina. *Transesophageal feeding tube terminates below the level of imaging, beyond the GE junction. *Right IJ approach central venous catheter tip terminates in the lower SVC. *Postsurgical changes from prior CABG with intact and aligned sternotomy wires and multiple mediastinal surgical clips. *Telemetry leads overlie the chest wall. Extensive bilateral mixed fine reticular and patchy airspace disease with scattered air bronchograms are overall similar in extent to comparison study from 08/25/2019. Decreasing definition of the left hemidiaphragm could reflect developing pleural fluid. No pneumothorax. No acute osseous or soft tissue abnormality. IMPRESSION: 1. No significant interval change in extensive bilateral mixed fine reticular and patchy airspace disease. 2. Stable support apparati. 3. Decreasing definition of the left hemidiaphragm could reflect trace effusion. 4. Prior sternotomy and CABG changes. Electronically Signed   By: Kreg Shropshire M.D.   On: 08/28/2019 05:29   DG Chest Port 1 View  Result Date: 08/25/2019 CLINICAL DATA:  COVID-19 positive patient. Status post NG tube placement. EXAM: PORTABLE CHEST 1 VIEW COMPARISON:  Single-view of the chest 08/24/2019. FINDINGS: NG tube courses into the stomach and below the inferior margin of the film. Right IJ catheter and endotracheal tube project in good position, unchanged. Extensive bilateral airspace disease is worse on left and has progressed since  yesterday's exam. No pneumothorax or pleural effusion. IMPRESSION: NG tube courses into the stomach below the inferior of the film. ET tube and right IJ catheter project in good position. Right worse than left airspace disease has progressed since yesterday's study. Electronically Signed   By: Drusilla Kanner M.D.   On: 08/25/2019 13:49   DG Chest Port 1 View  Result Date: 08/24/2019 CLINICAL DATA:  Acute respiratory failure. EXAM: PORTABLE CHEST 1 VIEW COMPARISON:  Radiograph yesterday. FINDINGS: Endotracheal tube, enteric tube, and right central line unchanged in position. Post median sternotomy and CABG. Heterogeneous bilateral airspace disease with slight improvement from yesterday. No pneumothorax. IMPRESSION: 1. Heterogeneous bilateral airspace disease with slight improvement from yesterday. 2. Unchanged support apparatus. Electronically Signed   By: Narda Rutherford M.D.   On: 08/24/2019 06:20   DG CHEST PORT 1 VIEW  Result Date: 08/23/2019 CLINICAL DATA:  Encounter for central line placement EXAM: PORTABLE CHEST 1 VIEW COMPARISON:  Chest radiograph 07/29/2019 FINDINGS: Interval intubation with endotracheal tube tip between the thoracic inlet and carina. Interval placement of a right central venous catheter with tip projecting over the cavoatrial junction. Interval placement of a nasogastric tube with side port projecting over the stomach. Stable cardiomediastinal contours status post median sternotomy and CABG. Interval increase in diffuse interstitial and airspace opacities throughout the bilateral lungs. No evidence of pneumothorax or large pleural effusion. IMPRESSION: 1. Appropriate positioning of the endotracheal tube, nasogastric tube and right central venous catheter. 2. Increased diffuse bilateral interstitial and airspace opacities. Electronically Signed   By: Emmaline Kluver M.D.   On: 08/23/2019 14:16   DG Chest Port 1 View  Result Date: 07/29/2019 CLINICAL DATA:  Shortness of  breath, hypoxia, COVID-19 positive EXAM: PORTABLE  CHEST 1 VIEW COMPARISON:  08/17/2019 FINDINGS: Postsurgical changes to the sternum and mediastinum. Stable heart size. Increased patchy opacities within the bilateral lungs, right greater than left. Opacities are in a predominantly peripheral distribution. No large pleural fluid collection. No pneumothorax. IMPRESSION: Increased patchy bilateral airspace opacities suspicious for multifocal atypical/viral pneumonia. Electronically Signed   By: Duanne GuessNicholas  Plundo D.O.   On: 04/25/2019 13:22   DG Chest Port 1 View  Result Date: 08/17/2019 CLINICAL DATA:  64 year old male with shortness of breath. Positive COVID-19. EXAM: PORTABLE CHEST 1 VIEW COMPARISON:  Chest radiograph dated 01/30/2015. FINDINGS: There is diffuse interstitial and vascular prominence most likely mild vascular congestion. Minimal hazy density in the mid to lower lung field, likely combination of vascular congestion and atelectasis. Atypical infection is less likely but not excluded. Clinical correlation is recommended. No focal consolidation, pleural effusion, or pneumothorax. Top-normal cardiac size. Median sternotomy wires and CABG vascular clips. Atherosclerotic calcification of the aorta. No acute osseous pathology. IMPRESSION: 1. Mild vascular congestion. 2. Minimal hazy density in the mid to lower lung field, likely combination of vascular congestion and atelectasis. Atypical infection is less likely but not excluded. No focal consolidation. Electronically Signed   By: Elgie CollardArash  Radparvar M.D.   On: 08/17/2019 16:49   VAS US LOWER EXTREMITY VENOUS (DVT)  Result Date: 08/28/2019  Lower Venous Study Indications: Covid-19, fever.  Limitations: Ventilation. Comparison Study: No prior study on file Performing Technologist: Sherren Kernsandace Kanady RVS  Examination Guidelines: A complete evaluation includes B-mode imaging, spectral Doppler, color Doppler, and power Doppler as needed of all accessible portions  of each vessel. Bilateral testing is considered an integral part of a complete examination. Limited examinations for reoccurring indications may be performed as noted.  +---------+---------------+---------+-----------+----------+-------------------+ RIGHT    CompressibilityPhasicitySpontaneityPropertiesThrombus Aging      +---------+---------------+---------+-----------+----------+-------------------+ CFV      Full           Yes      Yes                                      +---------+---------------+---------+-----------+----------+-------------------+ SFJ      Full                                                             +---------+---------------+---------+-----------+----------+-------------------+ FV Prox  Full                                                             +---------+---------------+---------+-----------+----------+-------------------+ FV Mid   Full                                                             +---------+---------------+---------+-----------+----------+-------------------+ FV DistalFull                                                             +---------+---------------+---------+-----------+----------+-------------------+  PFV      Full                                                             +---------+---------------+---------+-----------+----------+-------------------+ POP                     Yes      Yes                  patent by color and                                                       Doppler             +---------+---------------+---------+-----------+----------+-------------------+ PTV      Full                                                             +---------+---------------+---------+-----------+----------+-------------------+ PERO                                                  Not visualized       +---------+---------------+---------+-----------+----------+-------------------+   +---------+---------------+---------+-----------+----------+--------------+ LEFT     CompressibilityPhasicitySpontaneityPropertiesThrombus Aging +---------+---------------+---------+-----------+----------+--------------+ CFV      Full           Yes      Yes                                 +---------+---------------+---------+-----------+----------+--------------+ SFJ      Full                                                        +---------+---------------+---------+-----------+----------+--------------+ FV Prox  Full                                                        +---------+---------------+---------+-----------+----------+--------------+ FV Mid   Full                                                        +---------+---------------+---------+-----------+----------+--------------+ FV DistalFull                                                        +---------+---------------+---------+-----------+----------+--------------+  PFV      Full                                                        +---------+---------------+---------+-----------+----------+--------------+ POP      Full           Yes      Yes                                 +---------+---------------+---------+-----------+----------+--------------+ PTV      Full                                                        +---------+---------------+---------+-----------+----------+--------------+ PERO                                                  Not visualized +---------+---------------+---------+-----------+----------+--------------+     Summary: Right: There is no evidence of deep vein thrombosis in the lower extremity. However, portions of this examination were limited- see technologist comments above. Left: There is no evidence of deep vein thrombosis in the lower extremity. However, portions of  this examination were limited- see technologist comments above.  *See table(s) above for measurements and observations. Electronically signed by Sherald Hess MD on 08/28/2019 at 5:11:25 PM.    Final    VAS [place] UPPER EXTREMITY VENOUS DUPLEX  Result Date: 09/08/2019 UPPER VENOUS STUDY  Indications: Swelling, and Edema Other Indications: Covid positive. Comparison Study: No prior studies. Performing Technologist: Marilynne Halsted RDMS, RVT  Examination Guidelines: A complete evaluation includes B-mode imaging, spectral Doppler, color Doppler, and power Doppler as needed of all accessible portions of each vessel. Bilateral testing is considered an integral part of a complete examination. Limited examinations for reoccurring indications may be performed as noted.  Right Findings: +----------+------------+---------+-----------+----------+-------+ RIGHT     CompressiblePhasicitySpontaneousPropertiesSummary +----------+------------+---------+-----------+----------+-------+ IJV           Full       Yes       Yes                      +----------+------------+---------+-----------+----------+-------+ Subclavian    Full       Yes       Yes                      +----------+------------+---------+-----------+----------+-------+ Axillary      Full       Yes       Yes                      +----------+------------+---------+-----------+----------+-------+ Brachial      Full       Yes       Yes                      +----------+------------+---------+-----------+----------+-------+ Radial        Full                                          +----------+------------+---------+-----------+----------+-------+  Ulnar         Full                                          +----------+------------+---------+-----------+----------+-------+ Cephalic      Full                                          +----------+------------+---------+-----------+----------+-------+ Basilic        Full                                          +----------+------------+---------+-----------+----------+-------+  Left Findings: +----------+------------+---------+-----------+----------+-------+ LEFT      CompressiblePhasicitySpontaneousPropertiesSummary +----------+------------+---------+-----------+----------+-------+ Subclavian    Full       Yes       Yes                      +----------+------------+---------+-----------+----------+-------+  Summary:  Right: No evidence of deep vein thrombosis in the upper extremity. No evidence of superficial vein thrombosis in the upper extremity.  Left: No evidence of thrombosis in the subclavian.  *See table(s) above for measurements and observations.  Diagnosing physician: Fabienne Bruns MD Electronically signed by Fabienne Bruns MD on 09/08/2019 at 2:52:16 PM.    Final    [place] EKG SITE RITE  Result Date: 09/02/2019 If Site Rite image not attached, placement could not be confirmed due to current cardiac rhythm.  [place] EKG SITE RITE  Result Date: 08/23/2019 If Site Rite image not attached, placement could not be confirmed due to current cardiac rhythm.   Microbiology Recent Results (from the past 240 hour(s))  Culture, blood (routine x 2)     Status: Abnormal   Collection Time: 09/03/19 11:00 PM   Specimen: Right Antecubital; Blood  Result Value Ref Range Status   Specimen Description   Final    RIGHT ANTECUBITAL Performed at [facility], [address], [place], [place] [address]    Special Requests   Final    NONE Performed at [facility], [address] [place], [place] [address]    Culture  Setup Time   Final    GRAM POSITIVE COCCI IN CLUSTERS IN BOTH AEROBIC AND ANAEROBIC BOTTLES CRITICAL RESULT CALLED TO, READ BACK BY AND VERIFIED WITH: T. MEYER,PHARMD [id number] 09/05/2019 T. TYSOR    Culture (A)  Final    STAPHYLOCOCCUS SPECIES (COAGULASE NEGATIVE) THE SIGNIFICANCE OF ISOLATING THIS  ORGANISM FROM A SINGLE SET OF BLOOD CULTURES WHEN MULTIPLE SETS ARE DRAWN IS UNCERTAIN. PLEASE NOTIFY THE MICROBIOLOGY DEPARTMENT WITHIN ONE WEEK IF SPECIATION AND SENSITIVITIES ARE REQUIRED. Performed at [facility], [address], [place], [place] [address]    Report Status 09/07/2019 FINAL  Final  Culture, blood (routine x 2)     Status: None   Collection Time: 09/03/19 11:11 PM   Specimen: Left Antecubital; Blood  Result Value Ref Range Status   Specimen Description   Final    LEFT ANTECUBITAL Performed at [facility], [address] [place], [place] [address]    Special Requests   Final    NONE Performed at [facility], [address] [place],  [place] [address]    Culture   Final    NO GROWTH 5 DAYS Performed at [facility], [address], [place], [place] [address]    Report Status 09/09/2019 FINAL  Final    Lab Basic Metabolic Panel: Recent Labs  Lab 09/08/19 0410 09/08/19 0410 09/10/19 0500 09/10/19 0535 09/11/19 0325 09/12/19 0304 09/12/19 1635 08/22/2019 0410 09/10/2019 1037  NA 150*   < > 149*   < > 147* 146* 143 137 138  K 4.4   < > 3.8   < > 4.0 5.3* 5.1 4.0 3.8  CL 108   < > 105  --  105 104 103 99  --   CO2 34*   < > 32  --  29 27 27 26   --   GLUCOSE 120*   < > 200*  --  148* 70 201* 173*  --   BUN 109*   < > 156*  --  130* 211* 214* 137*  --   CREATININE 1.46*   < > 2.83*  --  3.67* 4.61* 4.90* 3.41*  --   CALCIUM 7.9*   < > 8.2*  --  8.1* 8.0* 7.4* 6.9*  --   MG 2.8*  --   --   --   --   --   --  3.0*  --   PHOS 3.6  --   --   --   --   --  8.1* 5.3*  --    < > = values in this interval not displayed.   Liver Function Tests: Recent Labs  Lab 09/12/19 1635 08/22/2019 0410  ALBUMIN 1.7* 1.8*   No results for input(s): LIPASE, AMYLASE in the last 168 hours. No results for input(s): AMMONIA in the last 168 hours. CBC: Recent Labs  Lab 09/08/19 0410 09/08/19 0410 09/10/19 0500 09/10/19 0500  09/10/19 0535 09/11/19 0325 09/12/19 0304 08/30/2019 1028 09/15/2019 1037  WBC 9.2  --  8.5  --   --  10.4 8.8 14.7*  --   NEUTROABS  --   --  7.1  --   --   --   --   --   --   HGB 8.7*   < > 8.0*   < > 7.8* 8.3* 8.0* 8.1* 8.5*  HCT 28.9*   < > 26.8*   < > 23.0* 26.8* 26.3* 26.2* 25.0*  MCV 107.0*  --  107.6*  --   --  105.9* 105.2* 108.7*  --   PLT 145*  --  163  --   --  202 225 258  --    < > = values in this interval not displayed.   Cardiac Enzymes: No results for input(s): CKTOTAL, CKMB, CKMBINDEX, TROPONINI in the last 168 hours. Sepsis Labs: Recent Labs  Lab 09/10/19 0500 09/11/19 0325 09/12/19 0304 08/26/2019 1028  WBC 8.5 10.4 8.8 14.7*    Procedures/Operations  ETT 1/2 >> Right IJ CVC 1/2 >> 1/12   3/12 09/14/2019, 4:59 PM

## 2019-09-22 NOTE — Progress Notes (Signed)
Dis cussed with CCM regarding possible need to start pt on sedation because of dis synchrony on the mech vent. Advised to give PRN hydromorphone as ordered. K= this AM=4. Awaiting orders from Nephrologist on call if effluent bags are to be switched to K+ containing bags.

## 2019-09-22 NOTE — Progress Notes (Signed)
Pt experienced sudden episode of hypoxia/bradycardia at 1015. Dr. Kendrick Fries at bedside. Pt on 100% FiO2. NaHCO3 given and 1 mg Epi given at 1018. Stat CBC, ABG and troponin sent. 12 ld EKG obtained, CRRT stopped and blood returned to pt. Additional amp of NaHCO3 given IVP at 1038. Update given to daughter, Wyatt Mage regarding severity of pt condition. Pt given 1 mg Dilaudid at 1040  to increase comfort. Levophed gtt initiated at 1055 d/t hypotension. Pt continuing to decline and  call made to daughter again. Phone placed in pts ear so that his wife and daughter could talk to him as death appeared imminent. Pt surrounded by multiple members of nursing staff to provide comfort and support. Pt asystolic and death pronounced by 2 RNs (myself and Marica Otter, RN)  per order. Time of death 12-06-10.   Post mortem care and checklist completed. Funeral home unknown and daughter given number for Patient Placement. Family requested that his belongings, including his wedding ring and dentures, be sent with pt to morgue. Wedding band picked up at [employer] office by myself and placed in belongings bag and sent to morgue with pt.

## 2019-09-22 NOTE — Progress Notes (Signed)
[facility] Progress Note  Name: [name] [name] MRN: [id number] DOB: [birth year]  Chief Complaint:  Shortness of breath   Subjective:  Patient was started on CRRT 1/22 after nontunneled catheter.  He had 1.5 liters UF over 1/22 with CRRT per charting.    Review of systems:  Unable to obtain 2/2 AMS -------------- Background on consult:   [name] [name] is an 64 y.o. male HTN HLD DM CAD OSA p/w shortness of breath, NP cough, fever, myalgias and noted to have an O2 sat of 65% on RA. CXR shows b/l opacities and pt tested COVID+ and elevated inflammatory markers. Treated with dexamethasone, remdesivir, Tocilizumab, Azithromycin and Ceftriaxone.   Pt was intubated on 1/2.= with lines changed out 1/12 and increasing FIO2 requirements over the past week + bradycardic PEA x2 on 1/16.  Pt was also on Vanco from 1/14-1/17. No contrast administered on review of the chart but has been on multiple pressor agents. Creatinine earlier in the hospitalization was in the 0.8-1 range but over the past 4 days has been steadily increasing to 211/4.61 He did have relative hypotension 1/12-1/18 req Phenylephrine. UOP also started decreasing significantly on 1/20. No contrast administered.     Intake/Output Summary (Last 24 hours) at 09/04/2019 0919 Last data filed at 09/19/2019 0900 Gross per 24 hour  Intake 1968.84 ml  Output 1761 ml  Net 207.84 ml    Vitals:  Vitals:   09/16/2019 0600 09/21/2019 0700 08/28/2019 0800 09/04/2019 0822  BP: (!) 97/57 (!) 102/59 (!) 112/57 127/65  Pulse: 81 85 87 82  Resp: (!) 39 (!) 39 (!) 25 (!) 39  Temp: 97.7 F (36.5 C)  97.7 F (36.5 C)   TempSrc: Oral  Axillary   SpO2: 98% 93% 92% (!) 88%  Weight:      Height:         Physical Exam:  General adult male intubated critically ill Lungs reduced breath sounds; no overt crackles; on 100% FIO2 and PEEP 10 Heart S1S2; no rub Abdomen soft nondistended  Extremities 1-2+ edema bilaterally upper and lower  extremities Neuro - does not respond to commands or exam  Access: Left IJ nontunneled dialysis catheter   Medications reviewed   Labs:  BMP Latest Ref Rng & Units 09/04/2019 09/12/2019 09/12/2019  Glucose 70 - 99 mg/dL 173(H) 201(H) 70  BUN 8 - 23 mg/dL 137(H) 214(H) 211(H)  Creatinine 0.61 - 1.24 mg/dL 3.41(H) 4.90(H) 4.61(H)  BUN/Creat Ratio 10 - 24 - - -  Sodium 135 - 145 mmol/L 137 143 146(H)  Potassium 3.5 - 5.1 mmol/L 4.0 5.1 5.3(H)  Chloride 98 - 111 mmol/L 99 103 104  CO2 22 - 32 mmol/L 26 27 27   Calcium 8.9 - 10.3 mg/dL 6.9(L) 7.4(L) 8.0(L)     Assessment/Plan:    # AKI  - secondary to ATN from PEA arrest and relative hypotension over a 5-6 day period. UOP also has been decreasing significantly and he's becoming more uremic, not waking up despite no sedation.  CRRT started 1/22 after nontunneled catheter.  Note baseline Cr near 1 - Continue CRRT - switch to 4K dialysate  - UF goal 50 to 100/hr as tolerated - Will discontinue cardura to avoid hypotension  - Per nursing metoprolol is for rate control    # Hypernatremia -  - Discontinue free water for now and follow   - Follow on CRRT   # ARDS - Acute hypoxic respiratory failure - per critical care.  Will optimize  volume with CRRT  # Covid 19 PNA - therapies per pulm/critical care   # HCAP - now off Cefepime; only on Vanco for 4 days.  # DM - per primary team   # Anemia macrocytic  - Secondary in part to AKI - no acute indication for PRBC's - Aranesp x 1 on 1/23  Estanislado Emms, MD 09/08/2019 9:19 AM

## 2019-09-22 NOTE — Progress Notes (Signed)
Assisted tele visit to patient with family member.  [name] [name] M, RN  

## 2019-09-22 DEATH — deceased

## 2019-11-08 ENCOUNTER — Other Ambulatory Visit: Payer: Self-pay | Admitting: Family Medicine

## 2019-11-08 DIAGNOSIS — G8929 Other chronic pain: Secondary | ICD-10-CM

## 2019-11-11 ENCOUNTER — Ambulatory Visit: Payer: BC Managed Care – PPO | Admitting: Family Medicine

## 2020-11-24 IMAGING — DX DG CHEST 1V PORT
1 series · 1 of 1 positions shown · non-contrast
Comparison: 08/17/2019

CLINICAL DATA: Shortness of breath, hypoxia, [id number] positive

EXAM:
PORTABLE CHEST 1 VIEW

[chest ap]
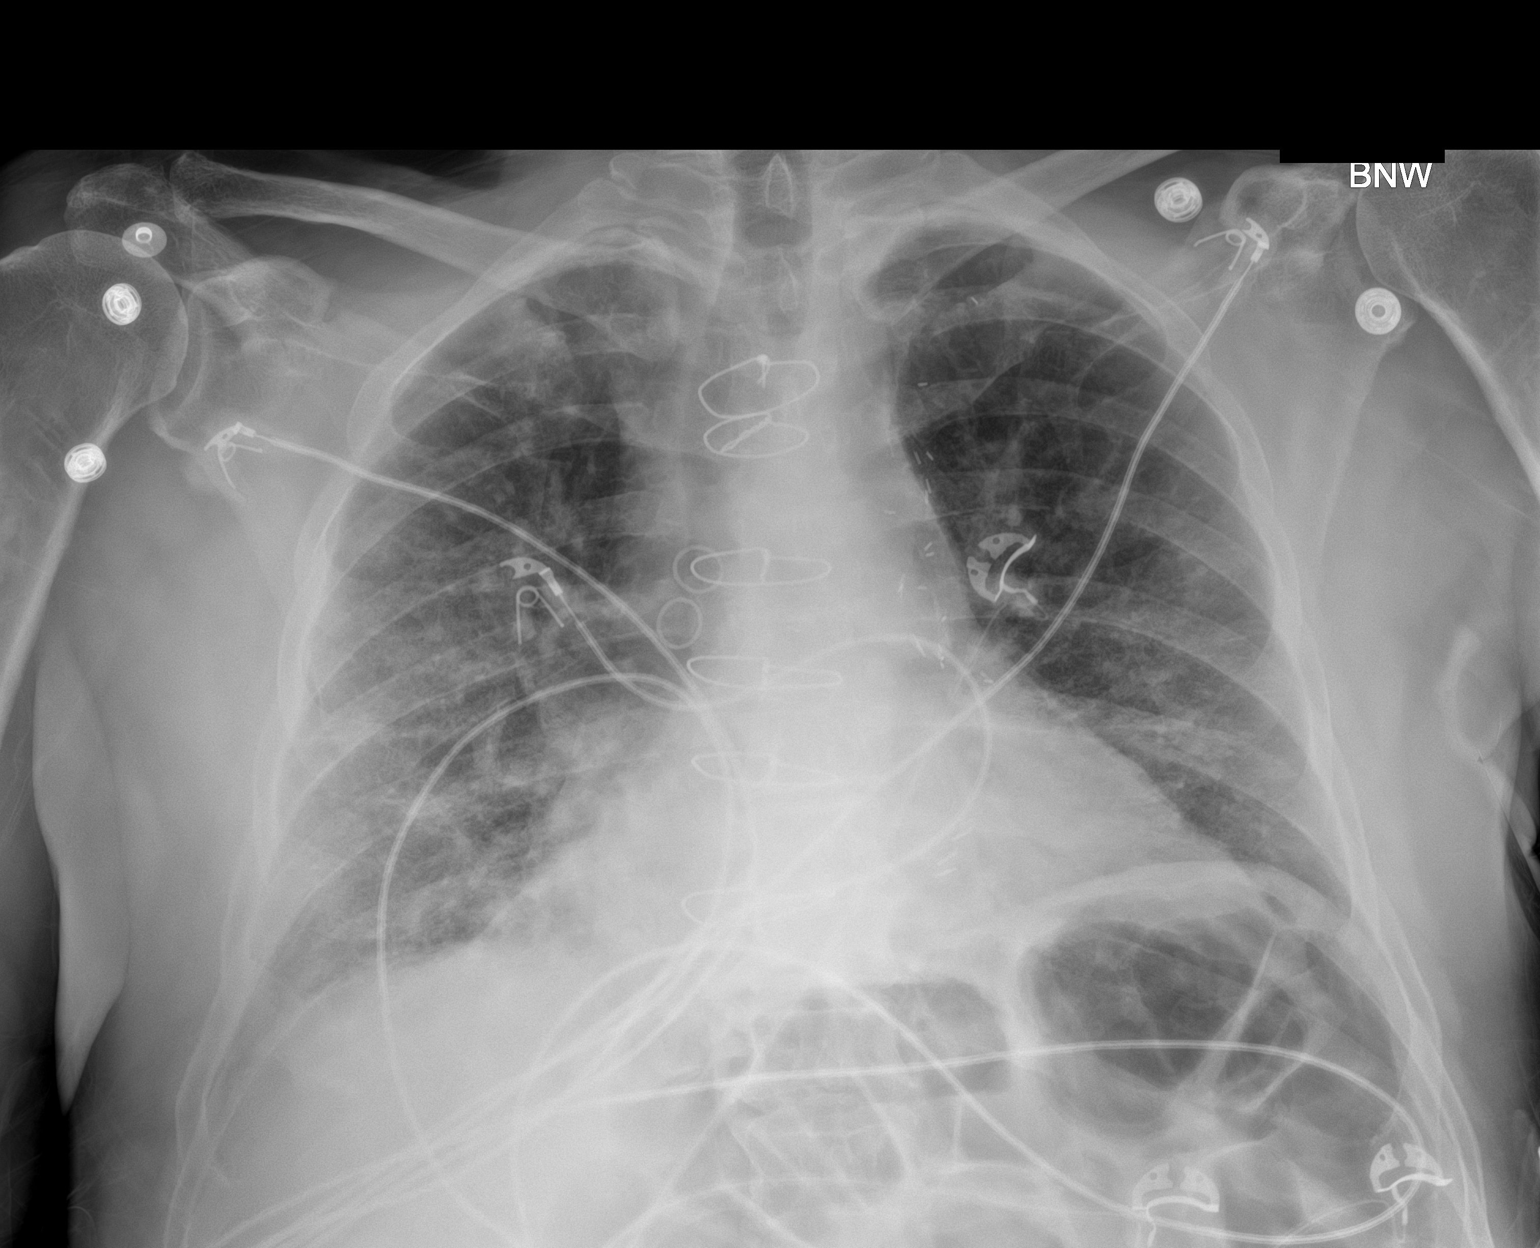

[1 of 1 positions shown; findings below may reference images not displayed]

FINDINGS: Postsurgical changes to the sternum and mediastinum. Stable heart
size. Increased patchy opacities within the bilateral lungs, right
greater than left. Opacities are in a predominantly peripheral
distribution. No large pleural fluid collection. No pneumothorax.
IMPRESSION: Increased patchy bilateral airspace opacities suspicious for
multifocal atypical/viral pneumonia.

## 2020-11-27 IMAGING — DX DG CHEST 1V PORT
1 series · 1 of 1 positions shown · non-contrast
Comparison: Chest radiograph 08/20/2019

CLINICAL DATA: Encounter for central line placement

EXAM:
PORTABLE CHEST 1 VIEW

[chest ap]
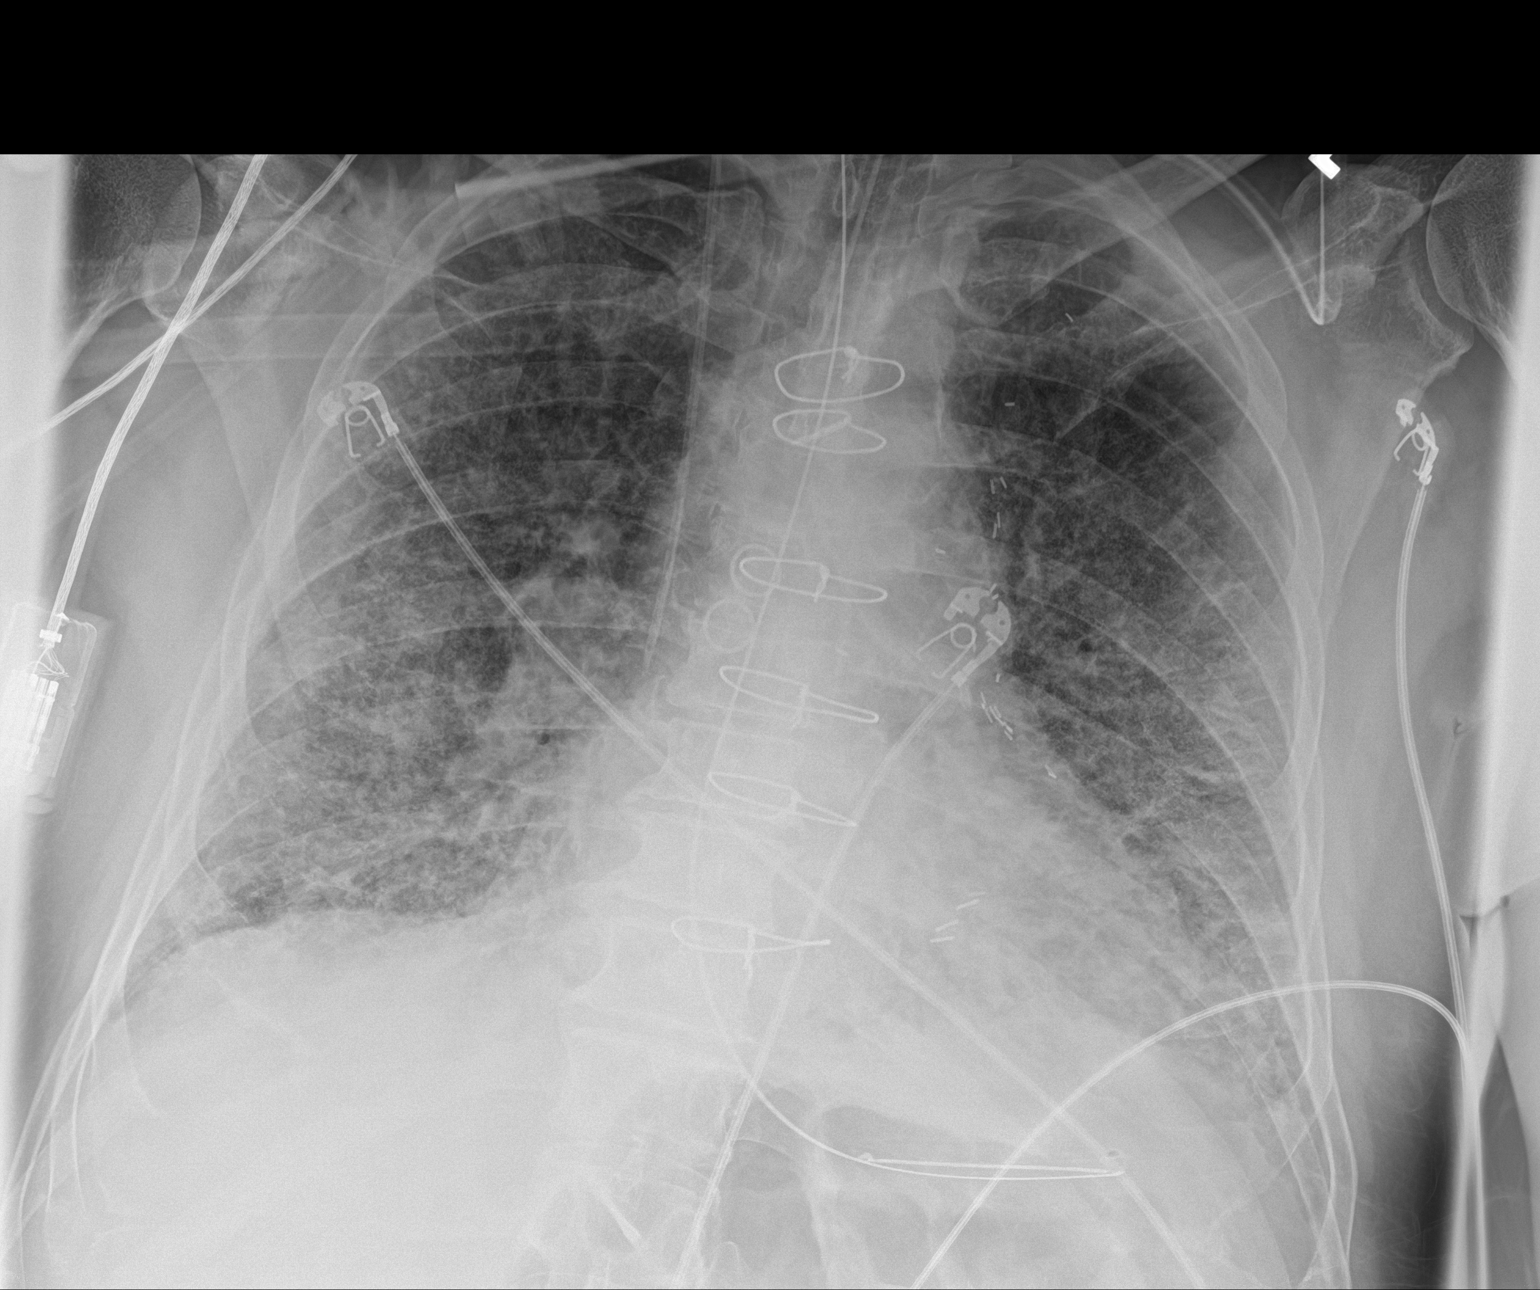

[1 of 1 positions shown; findings below may reference images not displayed]

FINDINGS: Interval intubation with endotracheal tube tip between the thoracic
inlet and carina. Interval placement of a right central venous
catheter with tip projecting over the cavoatrial junction. Interval
placement of a nasogastric tube with side port projecting over the
stomach.

Stable cardiomediastinal contours status post median sternotomy and
CABG. Interval increase in diffuse interstitial and airspace
opacities throughout the bilateral lungs. No evidence of
pneumothorax or large pleural effusion.
IMPRESSION: 1. Appropriate positioning of the endotracheal tube, nasogastric
tube and right central venous catheter.
2. Increased diffuse bilateral interstitial and airspace opacities.

## 2020-11-28 IMAGING — DX DG CHEST 1V PORT
1 series · 1 of 1 positions shown · non-contrast
Comparison: Radiograph yesterday.

CLINICAL DATA: Acute respiratory failure.

EXAM:
PORTABLE CHEST 1 VIEW

[chest ap]
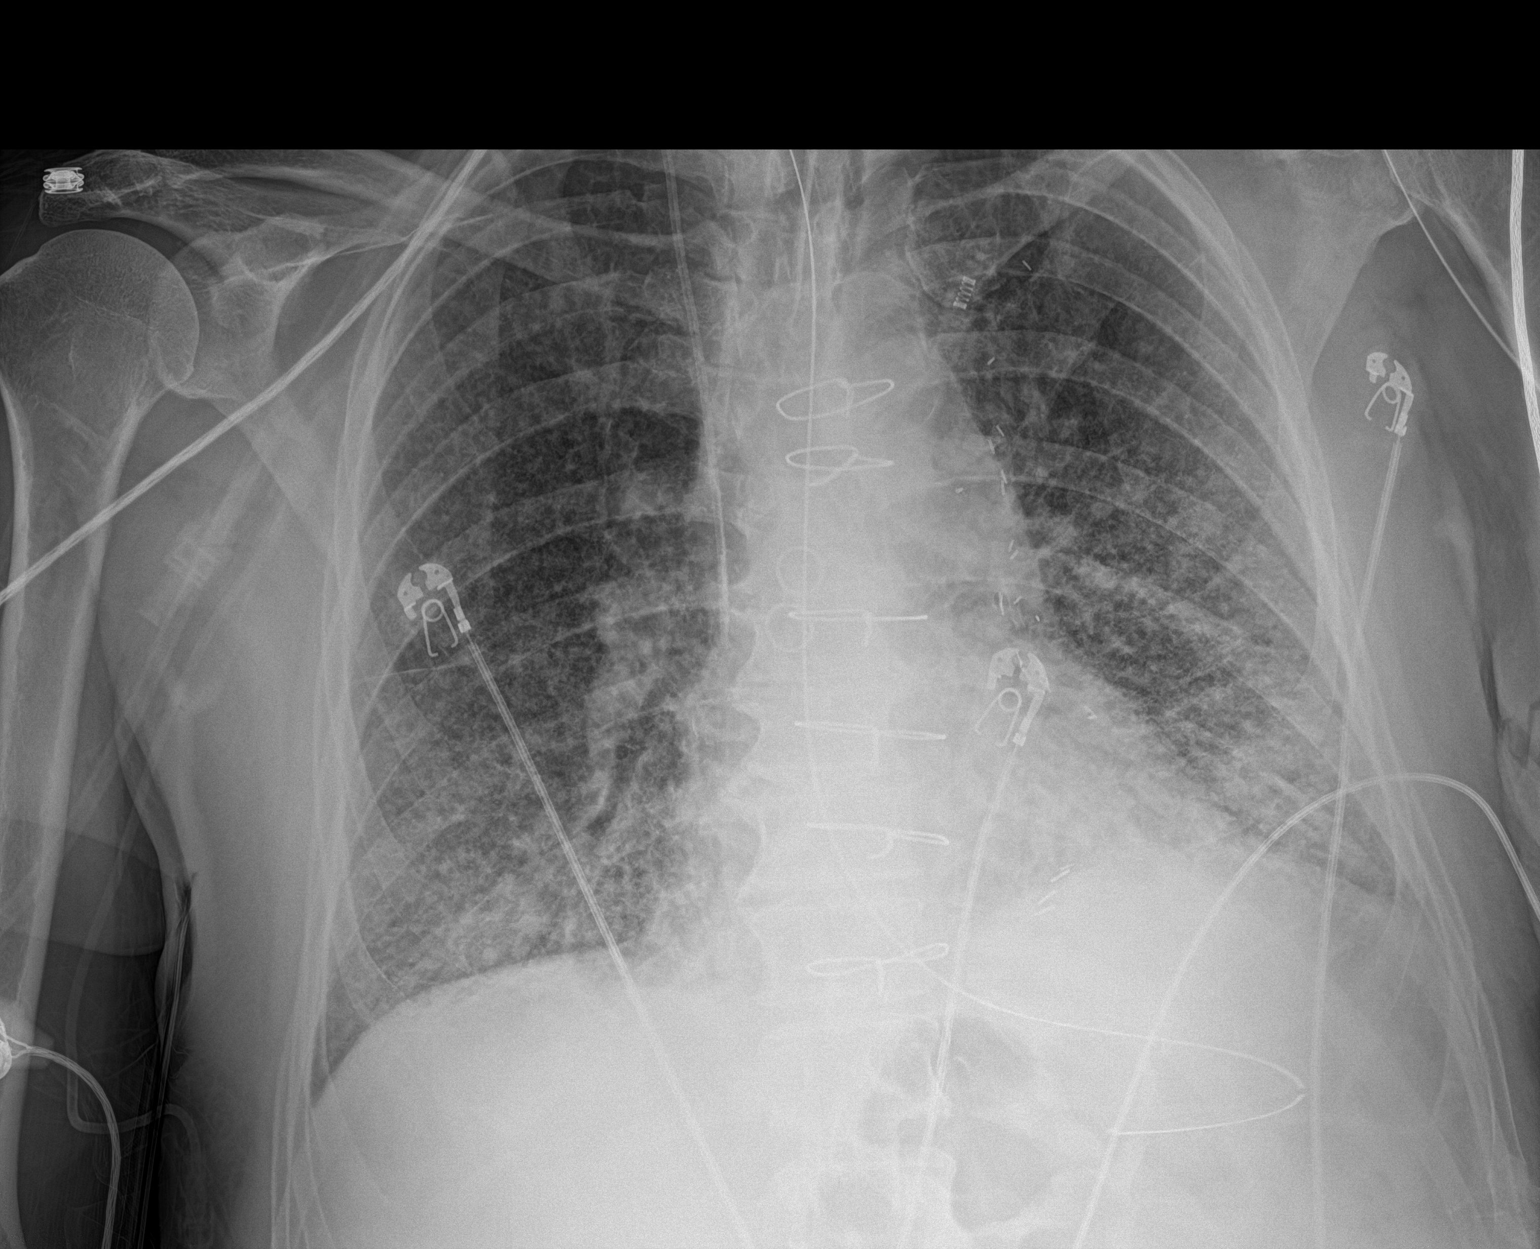

[1 of 1 positions shown; findings below may reference images not displayed]

FINDINGS: Endotracheal tube, enteric tube, and right central line unchanged in
position. Post median sternotomy and CABG. Heterogeneous bilateral
airspace disease with slight improvement from yesterday. No
pneumothorax.
IMPRESSION: 1. Heterogeneous bilateral airspace disease with slight improvement
from yesterday.
2. Unchanged support apparatus.

## 2020-11-29 IMAGING — DX DG CHEST 1V PORT
1 series · 1 of 1 positions shown · non-contrast
Comparison: Single-view of the chest 08/24/2019.

CLINICAL DATA: [id number] positive patient. Status post NG tube
placement.

EXAM:
PORTABLE CHEST 1 VIEW

[chest ap]
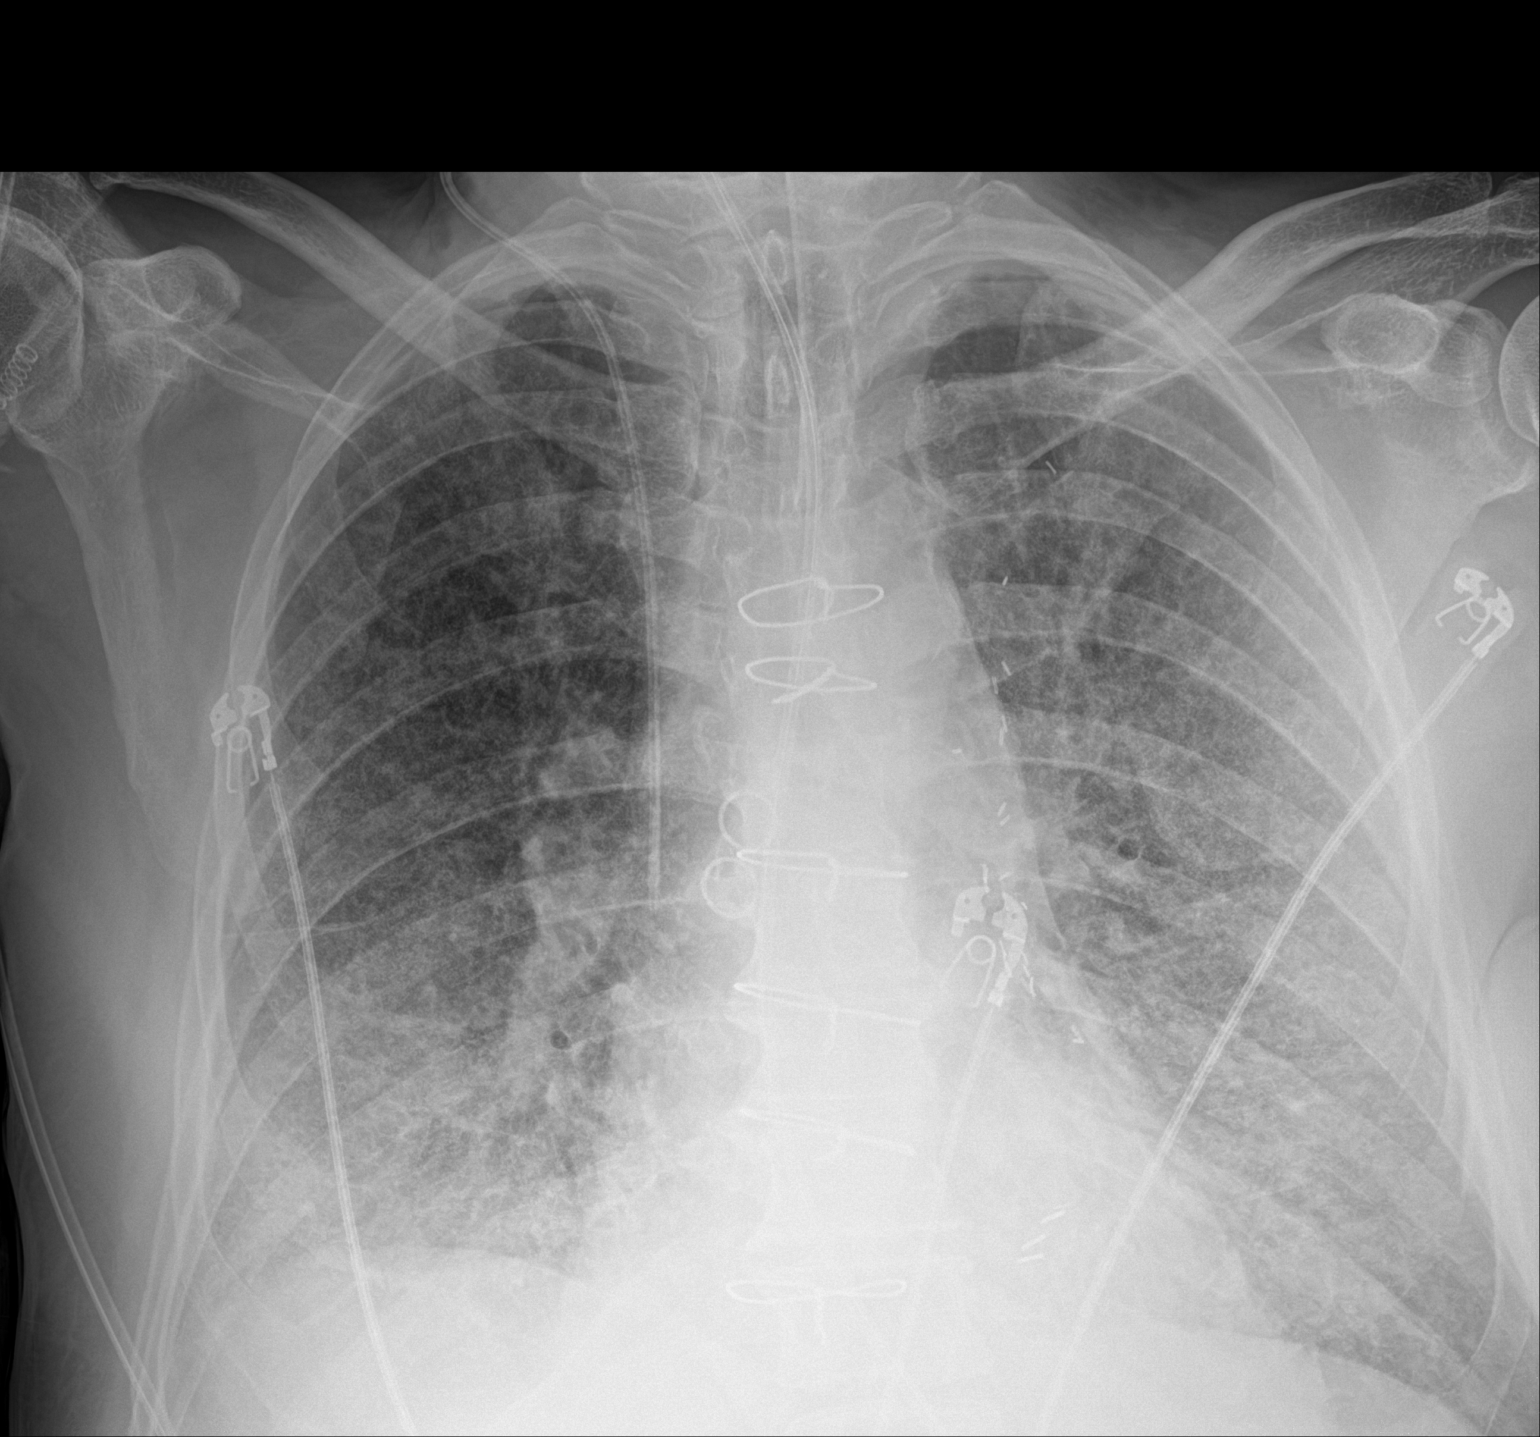

[1 of 1 positions shown; findings below may reference images not displayed]

FINDINGS: NG tube courses into the stomach and below the inferior margin of
the film. Right IJ catheter and endotracheal tube project in good
position, unchanged. Extensive bilateral airspace disease is worse
on left and has progressed since yesterday's exam. No pneumothorax
or pleural effusion.
IMPRESSION: NG tube courses into the stomach below the inferior of the film. ET
tube and right IJ catheter project in good position.

Right worse than left airspace disease has progressed since
yesterday's study.

## 2020-12-02 IMAGING — DX DG CHEST 1V PORT
1 series · 1 of 1 positions shown · non-contrast
Comparison: Radiograph 08/25/2019

CLINICAL DATA: Acute respiratory failure

EXAM:
PORTABLE CHEST 1 VIEW

[chest ap]
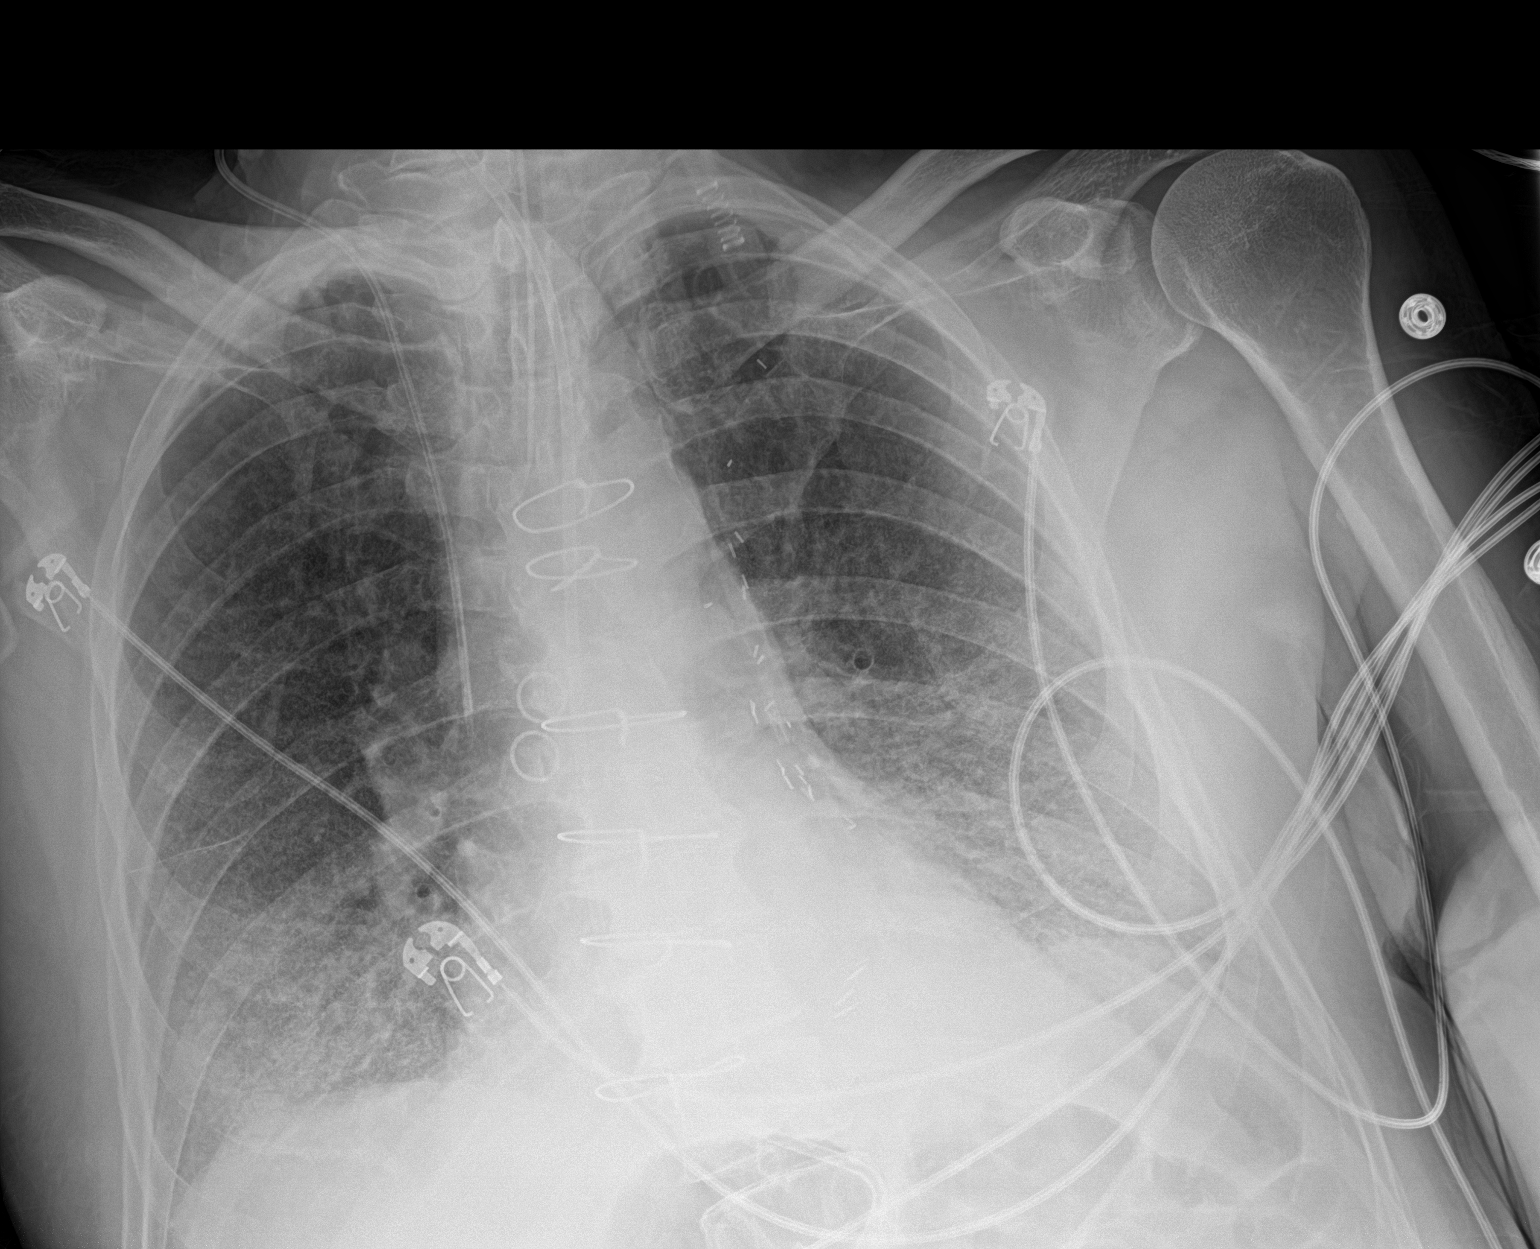

[1 of 1 positions shown; findings below may reference images not displayed]

FINDINGS: *Endotracheal tube terminates 4.4 cm from the carina.
*Transesophageal feeding tube terminates below the level of imaging,
beyond the GE junction.
*Right IJ approach central venous catheter tip terminates in the
lower SVC.
*Postsurgical changes from prior CABG with intact and aligned
sternotomy wires and multiple mediastinal surgical clips.
*Telemetry leads overlie the chest wall.

Extensive bilateral mixed fine reticular and patchy airspace disease
with scattered air bronchograms are overall similar in extent to
comparison study from 08/25/2019. Decreasing definition of the left
hemidiaphragm could reflect developing pleural fluid. No
pneumothorax. No acute osseous or soft tissue abnormality.
IMPRESSION: 1. No significant interval change in extensive bilateral mixed fine
reticular and patchy airspace disease.
2. Stable support apparati.
3. Decreasing definition of the left hemidiaphragm could reflect
trace effusion.
4. Prior sternotomy and CABG changes.
